# Patient Record
Sex: Female | Born: 1950 | Race: White | Hispanic: No | Marital: Married | Smoking: Never smoker
Health system: Southern US, Community
[De-identification: ages and names within clinical notes are randomized; demographics above are authoritative.]

## PROBLEM LIST (undated history)

## (undated) DIAGNOSIS — T8149XA Infection following a procedure, other surgical site, initial encounter: Secondary | ICD-10-CM

## (undated) DIAGNOSIS — Z9889 Other specified postprocedural states: Secondary | ICD-10-CM

## (undated) DIAGNOSIS — E119 Type 2 diabetes mellitus without complications: Secondary | ICD-10-CM

## (undated) DIAGNOSIS — R112 Nausea with vomiting, unspecified: Secondary | ICD-10-CM

## (undated) DIAGNOSIS — I10 Essential (primary) hypertension: Secondary | ICD-10-CM

## (undated) DIAGNOSIS — J189 Pneumonia, unspecified organism: Secondary | ICD-10-CM

## (undated) HISTORY — PX: COLONOSCOPY: SHX174

## (undated) HISTORY — PX: ABDOMINAL HYSTERECTOMY: SHX81

## (undated) HISTORY — PX: CHOLECYSTECTOMY: SHX55

---

## 1998-07-19 ENCOUNTER — Other Ambulatory Visit: Admission: RE | Admit: 1998-07-19 | Discharge: 1998-07-19 | Payer: Self-pay | Admitting: Obstetrics and Gynecology

## 1998-09-15 ENCOUNTER — Other Ambulatory Visit: Admission: RE | Admit: 1998-09-15 | Discharge: 1998-09-15 | Payer: Self-pay | Admitting: Obstetrics and Gynecology

## 1999-09-07 ENCOUNTER — Encounter: Admission: RE | Admit: 1999-09-07 | Discharge: 1999-12-06 | Payer: Self-pay | Admitting: Endocrinology

## 2001-11-04 ENCOUNTER — Other Ambulatory Visit: Admission: RE | Admit: 2001-11-04 | Discharge: 2001-11-04 | Payer: Self-pay | Admitting: *Deleted

## 2001-11-07 ENCOUNTER — Encounter: Admission: RE | Admit: 2001-11-07 | Discharge: 2001-11-07 | Payer: Self-pay | Admitting: *Deleted

## 2001-11-07 ENCOUNTER — Encounter: Payer: Self-pay | Admitting: *Deleted

## 2001-12-03 ENCOUNTER — Encounter: Payer: Self-pay | Admitting: *Deleted

## 2001-12-03 ENCOUNTER — Encounter: Admission: RE | Admit: 2001-12-03 | Discharge: 2001-12-03 | Payer: Self-pay | Admitting: *Deleted

## 2001-12-10 ENCOUNTER — Ambulatory Visit (HOSPITAL_COMMUNITY): Admission: RE | Admit: 2001-12-10 | Discharge: 2001-12-10 | Payer: Self-pay | Admitting: *Deleted

## 2002-01-27 ENCOUNTER — Encounter: Payer: Self-pay | Admitting: Emergency Medicine

## 2002-01-27 ENCOUNTER — Emergency Department (HOSPITAL_COMMUNITY): Admission: EM | Admit: 2002-01-27 | Discharge: 2002-01-27 | Payer: Self-pay | Admitting: Emergency Medicine

## 2002-01-30 ENCOUNTER — Observation Stay (HOSPITAL_COMMUNITY): Admission: RE | Admit: 2002-01-30 | Discharge: 2002-01-31 | Payer: Self-pay | Admitting: *Deleted

## 2005-04-17 ENCOUNTER — Other Ambulatory Visit: Admission: RE | Admit: 2005-04-17 | Discharge: 2005-04-17 | Payer: Self-pay | Admitting: Gynecology

## 2010-12-11 ENCOUNTER — Emergency Department (HOSPITAL_COMMUNITY): Payer: BLUE CROSS/BLUE SHIELD

## 2010-12-11 ENCOUNTER — Emergency Department (HOSPITAL_COMMUNITY)
Admission: EM | Admit: 2010-12-11 | Discharge: 2010-12-11 | Disposition: A | Discharge status: Home / Self Care | Payer: BLUE CROSS/BLUE SHIELD | Attending: Emergency Medicine | Admitting: Emergency Medicine

## 2010-12-11 DIAGNOSIS — K6289 Other specified diseases of anus and rectum: Secondary | ICD-10-CM | POA: Insufficient documentation

## 2010-12-11 DIAGNOSIS — K5900 Constipation, unspecified: Secondary | ICD-10-CM | POA: Insufficient documentation

## 2010-12-11 DIAGNOSIS — K644 Residual hemorrhoidal skin tags: Secondary | ICD-10-CM | POA: Insufficient documentation

## 2010-12-11 LAB — OCCULT BLOOD, POC DEVICE: Fecal Occult Bld: NEGATIVE

## 2011-01-25 ENCOUNTER — Encounter (INDEPENDENT_AMBULATORY_CARE_PROVIDER_SITE_OTHER): Payer: Self-pay | Admitting: General Surgery

## 2011-03-07 ENCOUNTER — Ambulatory Visit (INDEPENDENT_AMBULATORY_CARE_PROVIDER_SITE_OTHER): Payer: BLUE CROSS/BLUE SHIELD | Admitting: General Surgery

## 2011-03-07 ENCOUNTER — Encounter (INDEPENDENT_AMBULATORY_CARE_PROVIDER_SITE_OTHER): Payer: Self-pay | Admitting: General Surgery

## 2011-03-07 VITALS — Wt 246.0 lb

## 2011-03-07 DIAGNOSIS — K648 Other hemorrhoids: Secondary | ICD-10-CM

## 2011-03-07 MED ORDER — NAPROXEN 500 MG PO TABS: 500.0000 mg | ORAL_TABLET | Freq: Two times a day (BID) | ORAL | Status: DC

## 2011-03-07 MED ORDER — POLYETHYLENE GLYCOL 3350 17 G PO PACK: 17.0000 g | PACK | Freq: Every day | ORAL | Status: DC

## 2011-03-07 NOTE — Progress Notes (Signed)
Subjective:     Patient ID: Jacqueline Goodman, female   DOB: 02-Aug-1951, 60 y.o.   MRN: [id number]    Wt 246 lb (111.585 kg)    HPI Patient returns for followup of internal hemorrhoids. She is still having pain. His leads her to holding her bowel movements. She was seen in the emergency department. She was prescribed polyethylene glycol. She has also been taking naproxen. Both have helped. He has not had significant blood per rectum. She occasionally notices prolapse of her internal hemorrhoids.  Review of Systems     Objective:   Physical Exam Patient is awake and alert. Abdomen is soft nontender. Lungs are clear to auscultation. Heart is regular. Perianal exam reveals multiple small skin tags consistent with old external hemorrhoids. There is no acute inflammation or tenderness. There is no evidence of infection. Digital rectal exam reveals internal hemorrhoids in the posterior lateral left side. Anoscopy was then done. Inflamed internal hemorrhoid was tender to touch with banding forcep. The area above was then injected with sclerosing solution. There was minimal bleeding. She tolerated this well.    Assessment:     Inflamed prolapsing internal hemorrhoid.    Plan:     Area was injected as above. I will refill her naproxen. I will also refill her polyethylene glycol. I see her back in 6 weeks.

## 2011-04-18 ENCOUNTER — Encounter (INDEPENDENT_AMBULATORY_CARE_PROVIDER_SITE_OTHER): Payer: Self-pay | Admitting: General Surgery

## 2011-04-18 ENCOUNTER — Ambulatory Visit (INDEPENDENT_AMBULATORY_CARE_PROVIDER_SITE_OTHER): Payer: BLUE CROSS/BLUE SHIELD | Admitting: General Surgery

## 2011-04-18 VITALS — BP 142/80 | HR 56 | Wt 252.0 lb

## 2011-04-18 DIAGNOSIS — K648 Other hemorrhoids: Secondary | ICD-10-CM

## 2011-04-18 MED ORDER — POLYETHYLENE GLYCOL 3350 17 G PO PACK: 17.0000 g | PACK | Freq: Every day | ORAL | Status: DC

## 2011-04-18 MED ORDER — NAPROXEN 500 MG PO TABS: 500.0000 mg | ORAL_TABLET | Freq: Two times a day (BID) | ORAL | Status: DC

## 2011-04-18 NOTE — Progress Notes (Signed)
Subjective:     Patient ID: Jacqueline Goodman, female   DOB: 1951-07-02, 60 y.o.   MRN: [id number]  HPI Patient is status post injection of inflamed internal hemorrhoids. She continues to have some intermittent anal pain. She's only had one episode of bleeding since her last visit. She does request refill of pain medication and her polyethylene glycol. She has been avoiding constipation as long as she takes a polyethylene glycol. She's having no abdominal pain.  Review of Systems     Objective:   Physical Exam  Constitutional: She appears well-nourished. No distress.  HENT:  Head: Normocephalic and atraumatic.  Cardiovascular: Normal rate and regular rhythm.   Pulmonary/Chest: Effort normal. No respiratory distress.  Abdominal: Soft. She exhibits no distension. There is no tenderness.   External anal exam reveals several skin tags. There are noninflamed external hemorrhoids. Digital rectal exam reveals improvement of internal hemorrhoids and posterior area. Anoscopy was then done revealing a large stool burden. This was cleaned out and the scope was placed. The inflamed internal hemorrhoid in the posterior region is smaller but remains present. At this time it was nontender to grasping forceps. A band was applied in standard fashion and she tolerated this well with no bleeding.    Assessment:     Inflamed internal hemorrhoid    Plan:     This was banded as described above I will refill her polyethylene glycol and pain medicine. We'll see her back in 6 weeks. And as discussed in detail the patient and family member. Questions were answered.

## 2011-05-21 ENCOUNTER — Encounter (INDEPENDENT_AMBULATORY_CARE_PROVIDER_SITE_OTHER): Payer: Self-pay

## 2011-05-23 ENCOUNTER — Encounter (INDEPENDENT_AMBULATORY_CARE_PROVIDER_SITE_OTHER): Payer: Self-pay | Admitting: General Surgery

## 2011-05-23 ENCOUNTER — Ambulatory Visit (INDEPENDENT_AMBULATORY_CARE_PROVIDER_SITE_OTHER): Payer: BC Managed Care – PPO | Admitting: General Surgery

## 2011-05-23 VITALS — BP 148/90 | HR 90 | Temp 96.5°F | Wt 248.0 lb

## 2011-05-23 DIAGNOSIS — K648 Other hemorrhoids: Secondary | ICD-10-CM

## 2011-05-23 MED ORDER — NAPROXEN 500 MG PO TABS: 500.0000 mg | ORAL_TABLET | Freq: Two times a day (BID) | ORAL | Status: DC

## 2011-05-23 MED ORDER — POLYETHYLENE GLYCOL 3350 17 G PO PACK: 17.0000 g | PACK | Freq: Every day | ORAL | Status: DC

## 2011-05-23 NOTE — Progress Notes (Signed)
Subjective:     Patient ID: Jacqueline Goodman, female   DOB: 02/10/1951, 60 y.o.   MRN: [id number]  HPI Patient presents for followup of internal hemorrhoids. On her last visit she had banding procedure done. She has had no significant bleeding. She has in turn and pain. She does request refill of her naproxen which helped significantly with this discomfort. She had been doing pretty well stuck down into a period of constipation over this past weekend when [place]. She take a MiraLax and was able to go to the bathroom last night. She is otherwise doing pretty well.  Review of Systems     Objective:   Physical Exam  Eyes: Conjunctivae are normal. Pupils are equal, round, and reactive to light.  Cardiovascular: Normal rate, regular rhythm and normal heart sounds.   Pulmonary/Chest: Effort normal and breath sounds normal. She has no wheezes.  Abdominal: Soft. She exhibits no distension. There is no tenderness.  External anal exam reveals circumferential skin tags. There is no evidence of infection. There are no external hemorrhoids. Digital rectal exam reveals resolution of significant internal hemorrhoids. Anoscopy was then done showing minimal residual  internal hemorrhoid tissue with resolving inflammation. There is no bleeding. No other masses are seen.     Assessment:     Internal hemorrhoids responding well to office treatment    Plan:     Refill naproxen and MiraLax. No office treatments needed today. We will see how she is doing in 2 months.

## 2011-07-11 ENCOUNTER — Encounter (INDEPENDENT_AMBULATORY_CARE_PROVIDER_SITE_OTHER): Payer: BC Managed Care – PPO | Admitting: General Surgery

## 2011-08-22 ENCOUNTER — Encounter (INDEPENDENT_AMBULATORY_CARE_PROVIDER_SITE_OTHER): Payer: BC Managed Care – PPO | Admitting: General Surgery

## 2011-09-29 ENCOUNTER — Other Ambulatory Visit (INDEPENDENT_AMBULATORY_CARE_PROVIDER_SITE_OTHER): Payer: Self-pay | Admitting: General Surgery

## 2012-11-25 ENCOUNTER — Telehealth (INDEPENDENT_AMBULATORY_CARE_PROVIDER_SITE_OTHER): Payer: Self-pay

## 2012-11-25 NOTE — Telephone Encounter (Signed)
I agree with the advice you gave them.  Thank you

## 2012-11-25 NOTE — Telephone Encounter (Signed)
The daughter and the pt called.  She is upset on the phone because she hasn't had a bm since Saturday.  It was diarrhea then.  Before that was Tuesday.  She has tried Miralax, an enema and saline?Marland Kitchen  She feels sick and can't eat.  She has been in before for hemorrhoids.  I told her she is very constipated and may need to go to the ER to get disimpacted.   She said the last time she went there they didn't do anything for her but give her a RX and a pain pill.  I advised she can call her medical md.  We don't do disimpactions here.  I said she can see Dr Janee Morn tomorrow to check her hemorrhoids but she needs to be cleaned out.  He will not be able to examine her otherwise.  I said she should remain on liquids only.  She can take Miralax twice a day.  She asked about doing a bowel prep.  I said if she wants to try the bottle of Miralax mixed with 64 oz of Gatorade she could.  She is going to try and call me by the end of the day to let me know if she needs to come tomorrow if she gets cleaned out.  I told her I will ask Dr Janee Morn if he has any other advice.

## 2012-11-25 NOTE — Telephone Encounter (Signed)
I called to check on the patient and she said she had a good bowel movement after taking Miralax and Gatorade.  She feels much better.  She wants to cancel her appointment for tomorrow with Dr Janee Morn because she has missed too much work.  I cancelled the appointment.

## 2012-11-26 ENCOUNTER — Ambulatory Visit (INDEPENDENT_AMBULATORY_CARE_PROVIDER_SITE_OTHER): Payer: BC Managed Care – PPO | Admitting: General Surgery

## 2013-06-23 ENCOUNTER — Ambulatory Visit: Payer: Self-pay | Admitting: Gynecology

## 2014-04-27 ENCOUNTER — Encounter: Payer: Self-pay | Admitting: Gynecology

## 2014-12-01 ENCOUNTER — Encounter (INDEPENDENT_AMBULATORY_CARE_PROVIDER_SITE_OTHER): Payer: BLUE CROSS/BLUE SHIELD | Admitting: Ophthalmology

## 2014-12-01 DIAGNOSIS — I10 Essential (primary) hypertension: Secondary | ICD-10-CM

## 2014-12-01 DIAGNOSIS — E11331 Type 2 diabetes mellitus with moderate nonproliferative diabetic retinopathy with macular edema: Secondary | ICD-10-CM

## 2014-12-01 DIAGNOSIS — H35033 Hypertensive retinopathy, bilateral: Secondary | ICD-10-CM

## 2014-12-01 DIAGNOSIS — H43813 Vitreous degeneration, bilateral: Secondary | ICD-10-CM

## 2014-12-01 DIAGNOSIS — E11311 Type 2 diabetes mellitus with unspecified diabetic retinopathy with macular edema: Secondary | ICD-10-CM

## 2014-12-27 ENCOUNTER — Encounter (INDEPENDENT_AMBULATORY_CARE_PROVIDER_SITE_OTHER): Payer: BLUE CROSS/BLUE SHIELD | Admitting: Ophthalmology

## 2014-12-29 ENCOUNTER — Encounter (INDEPENDENT_AMBULATORY_CARE_PROVIDER_SITE_OTHER): Payer: BLUE CROSS/BLUE SHIELD | Admitting: Ophthalmology

## 2014-12-29 DIAGNOSIS — E11311 Type 2 diabetes mellitus with unspecified diabetic retinopathy with macular edema: Secondary | ICD-10-CM | POA: Diagnosis not present

## 2014-12-29 DIAGNOSIS — H43813 Vitreous degeneration, bilateral: Secondary | ICD-10-CM

## 2014-12-29 DIAGNOSIS — H35033 Hypertensive retinopathy, bilateral: Secondary | ICD-10-CM

## 2014-12-29 DIAGNOSIS — E11341 Type 2 diabetes mellitus with severe nonproliferative diabetic retinopathy with macular edema: Secondary | ICD-10-CM | POA: Diagnosis not present

## 2014-12-29 DIAGNOSIS — I10 Essential (primary) hypertension: Secondary | ICD-10-CM | POA: Diagnosis not present

## 2014-12-29 DIAGNOSIS — D3132 Benign neoplasm of left choroid: Secondary | ICD-10-CM | POA: Diagnosis not present

## 2015-01-26 ENCOUNTER — Encounter (INDEPENDENT_AMBULATORY_CARE_PROVIDER_SITE_OTHER): Payer: BLUE CROSS/BLUE SHIELD | Admitting: Ophthalmology

## 2015-01-26 DIAGNOSIS — D3132 Benign neoplasm of left choroid: Secondary | ICD-10-CM

## 2015-01-26 DIAGNOSIS — E11331 Type 2 diabetes mellitus with moderate nonproliferative diabetic retinopathy with macular edema: Secondary | ICD-10-CM

## 2015-01-26 DIAGNOSIS — H43813 Vitreous degeneration, bilateral: Secondary | ICD-10-CM

## 2015-01-26 DIAGNOSIS — E11311 Type 2 diabetes mellitus with unspecified diabetic retinopathy with macular edema: Secondary | ICD-10-CM

## 2015-01-26 DIAGNOSIS — I10 Essential (primary) hypertension: Secondary | ICD-10-CM | POA: Diagnosis not present

## 2015-01-26 DIAGNOSIS — H35033 Hypertensive retinopathy, bilateral: Secondary | ICD-10-CM | POA: Diagnosis not present

## 2015-02-09 ENCOUNTER — Other Ambulatory Visit (INDEPENDENT_AMBULATORY_CARE_PROVIDER_SITE_OTHER): Payer: BLUE CROSS/BLUE SHIELD | Admitting: Ophthalmology

## 2015-02-09 DIAGNOSIS — E11331 Type 2 diabetes mellitus with moderate nonproliferative diabetic retinopathy with macular edema: Secondary | ICD-10-CM

## 2015-02-09 DIAGNOSIS — E11311 Type 2 diabetes mellitus with unspecified diabetic retinopathy with macular edema: Secondary | ICD-10-CM | POA: Diagnosis not present

## 2015-02-23 ENCOUNTER — Encounter (INDEPENDENT_AMBULATORY_CARE_PROVIDER_SITE_OTHER): Payer: BLUE CROSS/BLUE SHIELD | Admitting: Ophthalmology

## 2015-02-23 DIAGNOSIS — D3132 Benign neoplasm of left choroid: Secondary | ICD-10-CM

## 2015-02-23 DIAGNOSIS — H43813 Vitreous degeneration, bilateral: Secondary | ICD-10-CM

## 2015-02-23 DIAGNOSIS — H35033 Hypertensive retinopathy, bilateral: Secondary | ICD-10-CM | POA: Diagnosis not present

## 2015-02-23 DIAGNOSIS — E11331 Type 2 diabetes mellitus with moderate nonproliferative diabetic retinopathy with macular edema: Secondary | ICD-10-CM | POA: Diagnosis not present

## 2015-02-23 DIAGNOSIS — I10 Essential (primary) hypertension: Secondary | ICD-10-CM | POA: Diagnosis not present

## 2015-02-23 DIAGNOSIS — E11311 Type 2 diabetes mellitus with unspecified diabetic retinopathy with macular edema: Secondary | ICD-10-CM | POA: Diagnosis not present

## 2015-03-11 ENCOUNTER — Other Ambulatory Visit: Payer: Self-pay | Admitting: Family Medicine

## 2015-03-11 DIAGNOSIS — E2839 Other primary ovarian failure: Secondary | ICD-10-CM

## 2015-03-11 DIAGNOSIS — Z1231 Encounter for screening mammogram for malignant neoplasm of breast: Secondary | ICD-10-CM

## 2015-03-14 ENCOUNTER — Ambulatory Visit
Admission: RE | Admit: 2015-03-14 | Discharge: 2015-03-14 | Disposition: A | Discharge status: Home / Self Care | Payer: BLUE CROSS/BLUE SHIELD | Source: Ambulatory Visit | Attending: Family Medicine | Admitting: Family Medicine

## 2015-03-14 DIAGNOSIS — E2839 Other primary ovarian failure: Secondary | ICD-10-CM

## 2015-03-23 ENCOUNTER — Encounter (INDEPENDENT_AMBULATORY_CARE_PROVIDER_SITE_OTHER): Payer: BLUE CROSS/BLUE SHIELD | Admitting: Ophthalmology

## 2015-03-23 DIAGNOSIS — E11331 Type 2 diabetes mellitus with moderate nonproliferative diabetic retinopathy with macular edema: Secondary | ICD-10-CM

## 2015-03-23 DIAGNOSIS — H43813 Vitreous degeneration, bilateral: Secondary | ICD-10-CM

## 2015-03-23 DIAGNOSIS — I10 Essential (primary) hypertension: Secondary | ICD-10-CM | POA: Diagnosis not present

## 2015-03-23 DIAGNOSIS — E11311 Type 2 diabetes mellitus with unspecified diabetic retinopathy with macular edema: Secondary | ICD-10-CM

## 2015-03-23 DIAGNOSIS — D3132 Benign neoplasm of left choroid: Secondary | ICD-10-CM

## 2015-03-23 DIAGNOSIS — H3531 Nonexudative age-related macular degeneration: Secondary | ICD-10-CM

## 2015-03-23 DIAGNOSIS — H35033 Hypertensive retinopathy, bilateral: Secondary | ICD-10-CM | POA: Diagnosis not present

## 2015-03-29 ENCOUNTER — Other Ambulatory Visit: Payer: Self-pay

## 2015-04-06 ENCOUNTER — Encounter (INDEPENDENT_AMBULATORY_CARE_PROVIDER_SITE_OTHER): Payer: BLUE CROSS/BLUE SHIELD | Admitting: Ophthalmology

## 2015-04-06 DIAGNOSIS — E11331 Type 2 diabetes mellitus with moderate nonproliferative diabetic retinopathy with macular edema: Secondary | ICD-10-CM | POA: Diagnosis not present

## 2015-04-06 DIAGNOSIS — E11311 Type 2 diabetes mellitus with unspecified diabetic retinopathy with macular edema: Secondary | ICD-10-CM | POA: Diagnosis not present

## 2015-04-13 ENCOUNTER — Encounter (INDEPENDENT_AMBULATORY_CARE_PROVIDER_SITE_OTHER): Payer: BLUE CROSS/BLUE SHIELD | Admitting: Ophthalmology

## 2015-04-18 ENCOUNTER — Encounter (INDEPENDENT_AMBULATORY_CARE_PROVIDER_SITE_OTHER): Payer: BLUE CROSS/BLUE SHIELD | Admitting: Ophthalmology

## 2015-04-18 DIAGNOSIS — E11331 Type 2 diabetes mellitus with moderate nonproliferative diabetic retinopathy with macular edema: Secondary | ICD-10-CM

## 2015-04-18 DIAGNOSIS — H43813 Vitreous degeneration, bilateral: Secondary | ICD-10-CM | POA: Diagnosis not present

## 2015-04-18 DIAGNOSIS — E11311 Type 2 diabetes mellitus with unspecified diabetic retinopathy with macular edema: Secondary | ICD-10-CM | POA: Diagnosis not present

## 2015-04-18 DIAGNOSIS — D3132 Benign neoplasm of left choroid: Secondary | ICD-10-CM | POA: Diagnosis not present

## 2015-04-18 DIAGNOSIS — H35033 Hypertensive retinopathy, bilateral: Secondary | ICD-10-CM

## 2015-04-18 DIAGNOSIS — I10 Essential (primary) hypertension: Secondary | ICD-10-CM

## 2015-04-28 ENCOUNTER — Ambulatory Visit: Payer: Self-pay

## 2015-05-18 ENCOUNTER — Encounter (INDEPENDENT_AMBULATORY_CARE_PROVIDER_SITE_OTHER): Payer: BLUE CROSS/BLUE SHIELD | Admitting: Ophthalmology

## 2015-05-18 DIAGNOSIS — D3132 Benign neoplasm of left choroid: Secondary | ICD-10-CM | POA: Diagnosis not present

## 2015-05-18 DIAGNOSIS — H35033 Hypertensive retinopathy, bilateral: Secondary | ICD-10-CM

## 2015-05-18 DIAGNOSIS — E11331 Type 2 diabetes mellitus with moderate nonproliferative diabetic retinopathy with macular edema: Secondary | ICD-10-CM | POA: Diagnosis not present

## 2015-05-18 DIAGNOSIS — H43813 Vitreous degeneration, bilateral: Secondary | ICD-10-CM | POA: Diagnosis not present

## 2015-05-18 DIAGNOSIS — I10 Essential (primary) hypertension: Secondary | ICD-10-CM | POA: Diagnosis not present

## 2015-05-18 DIAGNOSIS — E11311 Type 2 diabetes mellitus with unspecified diabetic retinopathy with macular edema: Secondary | ICD-10-CM

## 2015-06-15 ENCOUNTER — Encounter (INDEPENDENT_AMBULATORY_CARE_PROVIDER_SITE_OTHER): Payer: BLUE CROSS/BLUE SHIELD | Admitting: Ophthalmology

## 2015-06-15 DIAGNOSIS — H353121 Nonexudative age-related macular degeneration, left eye, early dry stage: Secondary | ICD-10-CM

## 2015-06-15 DIAGNOSIS — I10 Essential (primary) hypertension: Secondary | ICD-10-CM

## 2015-06-15 DIAGNOSIS — E11311 Type 2 diabetes mellitus with unspecified diabetic retinopathy with macular edema: Secondary | ICD-10-CM | POA: Diagnosis not present

## 2015-06-15 DIAGNOSIS — H43813 Vitreous degeneration, bilateral: Secondary | ICD-10-CM | POA: Diagnosis not present

## 2015-06-15 DIAGNOSIS — D3132 Benign neoplasm of left choroid: Secondary | ICD-10-CM

## 2015-06-15 DIAGNOSIS — E113313 Type 2 diabetes mellitus with moderate nonproliferative diabetic retinopathy with macular edema, bilateral: Secondary | ICD-10-CM

## 2015-06-15 DIAGNOSIS — H35033 Hypertensive retinopathy, bilateral: Secondary | ICD-10-CM

## 2015-07-01 ENCOUNTER — Encounter: Payer: Self-pay | Admitting: Gynecology

## 2015-07-13 ENCOUNTER — Encounter (INDEPENDENT_AMBULATORY_CARE_PROVIDER_SITE_OTHER): Payer: BLUE CROSS/BLUE SHIELD | Admitting: Ophthalmology

## 2015-07-13 DIAGNOSIS — H43813 Vitreous degeneration, bilateral: Secondary | ICD-10-CM

## 2015-07-13 DIAGNOSIS — H35033 Hypertensive retinopathy, bilateral: Secondary | ICD-10-CM

## 2015-07-13 DIAGNOSIS — E11311 Type 2 diabetes mellitus with unspecified diabetic retinopathy with macular edema: Secondary | ICD-10-CM | POA: Diagnosis not present

## 2015-07-13 DIAGNOSIS — E113313 Type 2 diabetes mellitus with moderate nonproliferative diabetic retinopathy with macular edema, bilateral: Secondary | ICD-10-CM | POA: Diagnosis not present

## 2015-07-13 DIAGNOSIS — H353121 Nonexudative age-related macular degeneration, left eye, early dry stage: Secondary | ICD-10-CM

## 2015-07-13 DIAGNOSIS — I10 Essential (primary) hypertension: Secondary | ICD-10-CM | POA: Diagnosis not present

## 2015-07-13 DIAGNOSIS — D3132 Benign neoplasm of left choroid: Secondary | ICD-10-CM | POA: Diagnosis not present

## 2015-08-10 ENCOUNTER — Encounter (INDEPENDENT_AMBULATORY_CARE_PROVIDER_SITE_OTHER): Payer: BLUE CROSS/BLUE SHIELD | Admitting: Ophthalmology

## 2015-08-10 DIAGNOSIS — H43813 Vitreous degeneration, bilateral: Secondary | ICD-10-CM | POA: Diagnosis not present

## 2015-08-10 DIAGNOSIS — E113312 Type 2 diabetes mellitus with moderate nonproliferative diabetic retinopathy with macular edema, left eye: Secondary | ICD-10-CM

## 2015-08-10 DIAGNOSIS — H5340 Unspecified visual field defects: Secondary | ICD-10-CM | POA: Diagnosis not present

## 2015-08-10 DIAGNOSIS — E11311 Type 2 diabetes mellitus with unspecified diabetic retinopathy with macular edema: Secondary | ICD-10-CM | POA: Diagnosis not present

## 2015-08-10 DIAGNOSIS — E113391 Type 2 diabetes mellitus with moderate nonproliferative diabetic retinopathy without macular edema, right eye: Secondary | ICD-10-CM | POA: Diagnosis not present

## 2015-08-10 DIAGNOSIS — H35033 Hypertensive retinopathy, bilateral: Secondary | ICD-10-CM | POA: Diagnosis not present

## 2015-08-10 DIAGNOSIS — D3132 Benign neoplasm of left choroid: Secondary | ICD-10-CM

## 2015-08-10 DIAGNOSIS — I10 Essential (primary) hypertension: Secondary | ICD-10-CM | POA: Diagnosis not present

## 2015-09-05 ENCOUNTER — Encounter: Payer: Self-pay | Admitting: Gynecology

## 2015-09-07 ENCOUNTER — Encounter (INDEPENDENT_AMBULATORY_CARE_PROVIDER_SITE_OTHER): Payer: BLUE CROSS/BLUE SHIELD | Admitting: Ophthalmology

## 2015-09-07 DIAGNOSIS — H35033 Hypertensive retinopathy, bilateral: Secondary | ICD-10-CM | POA: Diagnosis not present

## 2015-09-07 DIAGNOSIS — I10 Essential (primary) hypertension: Secondary | ICD-10-CM

## 2015-09-07 DIAGNOSIS — E11311 Type 2 diabetes mellitus with unspecified diabetic retinopathy with macular edema: Secondary | ICD-10-CM

## 2015-09-07 DIAGNOSIS — H43813 Vitreous degeneration, bilateral: Secondary | ICD-10-CM

## 2015-09-07 DIAGNOSIS — E113312 Type 2 diabetes mellitus with moderate nonproliferative diabetic retinopathy with macular edema, left eye: Secondary | ICD-10-CM

## 2015-09-07 DIAGNOSIS — E113391 Type 2 diabetes mellitus with moderate nonproliferative diabetic retinopathy without macular edema, right eye: Secondary | ICD-10-CM

## 2015-09-07 DIAGNOSIS — D3132 Benign neoplasm of left choroid: Secondary | ICD-10-CM

## 2015-10-05 ENCOUNTER — Encounter (INDEPENDENT_AMBULATORY_CARE_PROVIDER_SITE_OTHER): Payer: BLUE CROSS/BLUE SHIELD | Admitting: Ophthalmology

## 2015-10-05 DIAGNOSIS — H35033 Hypertensive retinopathy, bilateral: Secondary | ICD-10-CM

## 2015-10-05 DIAGNOSIS — H43813 Vitreous degeneration, bilateral: Secondary | ICD-10-CM | POA: Diagnosis not present

## 2015-10-05 DIAGNOSIS — I10 Essential (primary) hypertension: Secondary | ICD-10-CM | POA: Diagnosis not present

## 2015-10-05 DIAGNOSIS — D3132 Benign neoplasm of left choroid: Secondary | ICD-10-CM

## 2015-10-05 DIAGNOSIS — E113313 Type 2 diabetes mellitus with moderate nonproliferative diabetic retinopathy with macular edema, bilateral: Secondary | ICD-10-CM | POA: Diagnosis not present

## 2015-10-05 DIAGNOSIS — E11311 Type 2 diabetes mellitus with unspecified diabetic retinopathy with macular edema: Secondary | ICD-10-CM

## 2015-10-17 LAB — HM DIABETES EYE EXAM

## 2015-11-02 ENCOUNTER — Encounter (INDEPENDENT_AMBULATORY_CARE_PROVIDER_SITE_OTHER): Payer: BLUE CROSS/BLUE SHIELD | Admitting: Ophthalmology

## 2015-11-02 DIAGNOSIS — I10 Essential (primary) hypertension: Secondary | ICD-10-CM

## 2015-11-02 DIAGNOSIS — H35033 Hypertensive retinopathy, bilateral: Secondary | ICD-10-CM | POA: Diagnosis not present

## 2015-11-02 DIAGNOSIS — E113313 Type 2 diabetes mellitus with moderate nonproliferative diabetic retinopathy with macular edema, bilateral: Secondary | ICD-10-CM | POA: Diagnosis not present

## 2015-11-02 DIAGNOSIS — D3132 Benign neoplasm of left choroid: Secondary | ICD-10-CM | POA: Diagnosis not present

## 2015-11-02 DIAGNOSIS — E11311 Type 2 diabetes mellitus with unspecified diabetic retinopathy with macular edema: Secondary | ICD-10-CM | POA: Diagnosis not present

## 2015-11-02 DIAGNOSIS — H43813 Vitreous degeneration, bilateral: Secondary | ICD-10-CM | POA: Diagnosis not present

## 2015-11-25 ENCOUNTER — Encounter (INDEPENDENT_AMBULATORY_CARE_PROVIDER_SITE_OTHER): Payer: BLUE CROSS/BLUE SHIELD | Admitting: Ophthalmology

## 2015-11-30 ENCOUNTER — Encounter (INDEPENDENT_AMBULATORY_CARE_PROVIDER_SITE_OTHER): Payer: BLUE CROSS/BLUE SHIELD | Admitting: Ophthalmology

## 2015-11-30 DIAGNOSIS — E11311 Type 2 diabetes mellitus with unspecified diabetic retinopathy with macular edema: Secondary | ICD-10-CM

## 2015-11-30 DIAGNOSIS — H43813 Vitreous degeneration, bilateral: Secondary | ICD-10-CM | POA: Diagnosis not present

## 2015-11-30 DIAGNOSIS — H35033 Hypertensive retinopathy, bilateral: Secondary | ICD-10-CM | POA: Diagnosis not present

## 2015-11-30 DIAGNOSIS — I10 Essential (primary) hypertension: Secondary | ICD-10-CM | POA: Diagnosis not present

## 2015-11-30 DIAGNOSIS — D3132 Benign neoplasm of left choroid: Secondary | ICD-10-CM

## 2015-11-30 DIAGNOSIS — E113313 Type 2 diabetes mellitus with moderate nonproliferative diabetic retinopathy with macular edema, bilateral: Secondary | ICD-10-CM

## 2016-01-04 ENCOUNTER — Encounter (INDEPENDENT_AMBULATORY_CARE_PROVIDER_SITE_OTHER): Payer: BLUE CROSS/BLUE SHIELD | Admitting: Ophthalmology

## 2016-01-04 DIAGNOSIS — E11311 Type 2 diabetes mellitus with unspecified diabetic retinopathy with macular edema: Secondary | ICD-10-CM

## 2016-01-04 DIAGNOSIS — H35033 Hypertensive retinopathy, bilateral: Secondary | ICD-10-CM | POA: Diagnosis not present

## 2016-01-04 DIAGNOSIS — H43813 Vitreous degeneration, bilateral: Secondary | ICD-10-CM

## 2016-01-04 DIAGNOSIS — D3132 Benign neoplasm of left choroid: Secondary | ICD-10-CM | POA: Diagnosis not present

## 2016-01-04 DIAGNOSIS — I10 Essential (primary) hypertension: Secondary | ICD-10-CM

## 2016-01-04 DIAGNOSIS — E113313 Type 2 diabetes mellitus with moderate nonproliferative diabetic retinopathy with macular edema, bilateral: Secondary | ICD-10-CM | POA: Diagnosis not present

## 2016-03-01 ENCOUNTER — Encounter (INDEPENDENT_AMBULATORY_CARE_PROVIDER_SITE_OTHER): Payer: BLUE CROSS/BLUE SHIELD | Admitting: Ophthalmology

## 2016-03-01 DIAGNOSIS — I10 Essential (primary) hypertension: Secondary | ICD-10-CM

## 2016-03-01 DIAGNOSIS — H43813 Vitreous degeneration, bilateral: Secondary | ICD-10-CM | POA: Diagnosis not present

## 2016-03-01 DIAGNOSIS — E11311 Type 2 diabetes mellitus with unspecified diabetic retinopathy with macular edema: Secondary | ICD-10-CM | POA: Diagnosis not present

## 2016-03-01 DIAGNOSIS — E113313 Type 2 diabetes mellitus with moderate nonproliferative diabetic retinopathy with macular edema, bilateral: Secondary | ICD-10-CM

## 2016-03-01 DIAGNOSIS — D3132 Benign neoplasm of left choroid: Secondary | ICD-10-CM | POA: Diagnosis not present

## 2016-03-01 DIAGNOSIS — H35033 Hypertensive retinopathy, bilateral: Secondary | ICD-10-CM

## 2016-04-25 ENCOUNTER — Encounter (INDEPENDENT_AMBULATORY_CARE_PROVIDER_SITE_OTHER): Payer: BLUE CROSS/BLUE SHIELD | Admitting: Ophthalmology

## 2016-04-25 DIAGNOSIS — I10 Essential (primary) hypertension: Secondary | ICD-10-CM

## 2016-04-25 DIAGNOSIS — E113312 Type 2 diabetes mellitus with moderate nonproliferative diabetic retinopathy with macular edema, left eye: Secondary | ICD-10-CM | POA: Diagnosis not present

## 2016-04-25 DIAGNOSIS — H43813 Vitreous degeneration, bilateral: Secondary | ICD-10-CM | POA: Diagnosis not present

## 2016-04-25 DIAGNOSIS — E11311 Type 2 diabetes mellitus with unspecified diabetic retinopathy with macular edema: Secondary | ICD-10-CM | POA: Diagnosis not present

## 2016-04-25 DIAGNOSIS — E113391 Type 2 diabetes mellitus with moderate nonproliferative diabetic retinopathy without macular edema, right eye: Secondary | ICD-10-CM | POA: Diagnosis not present

## 2016-04-25 DIAGNOSIS — H35033 Hypertensive retinopathy, bilateral: Secondary | ICD-10-CM

## 2016-04-25 DIAGNOSIS — D3132 Benign neoplasm of left choroid: Secondary | ICD-10-CM

## 2016-04-26 ENCOUNTER — Encounter (INDEPENDENT_AMBULATORY_CARE_PROVIDER_SITE_OTHER): Payer: BLUE CROSS/BLUE SHIELD | Admitting: Ophthalmology

## 2016-06-06 ENCOUNTER — Encounter: Payer: Self-pay | Admitting: Family Medicine

## 2016-06-06 ENCOUNTER — Ambulatory Visit (INDEPENDENT_AMBULATORY_CARE_PROVIDER_SITE_OTHER): Payer: Medicare Other | Admitting: Family Medicine

## 2016-06-06 VITALS — BP 151/84 | HR 84 | Temp 98.0°F | Ht 68.2 in | Wt 248.4 lb

## 2016-06-06 DIAGNOSIS — R609 Edema, unspecified: Secondary | ICD-10-CM

## 2016-06-06 DIAGNOSIS — E669 Obesity, unspecified: Secondary | ICD-10-CM

## 2016-06-06 DIAGNOSIS — Z114 Encounter for screening for human immunodeficiency virus [HIV]: Secondary | ICD-10-CM

## 2016-06-06 DIAGNOSIS — Z1159 Encounter for screening for other viral diseases: Secondary | ICD-10-CM | POA: Diagnosis not present

## 2016-06-06 DIAGNOSIS — E118 Type 2 diabetes mellitus with unspecified complications: Secondary | ICD-10-CM

## 2016-06-06 DIAGNOSIS — E1169 Type 2 diabetes mellitus with other specified complication: Secondary | ICD-10-CM | POA: Diagnosis not present

## 2016-06-06 LAB — CBC
HCT: 39 % (ref 35.0–45.0)
Hemoglobin: 13.6 g/dL (ref 11.7–15.5)
MCH: 29.5 pg (ref 27.0–33.0)
MCHC: 34.9 g/dL (ref 32.0–36.0)
MCV: 84.6 fL (ref 80.0–100.0)
MPV: 8.7 fL (ref 7.5–12.5)
Platelets: 236 10*3/uL (ref 140–400)
RBC: 4.61 MIL/uL (ref 3.80–5.10)
RDW: 13.3 % (ref 11.0–15.0)
WBC: 8.3 10*3/uL (ref 3.8–10.8)

## 2016-06-06 LAB — COMPLETE METABOLIC PANEL WITH GFR
ALT: 15 U/L (ref 6–29)
AST: 22 U/L (ref 10–35)
Albumin: 3.7 g/dL (ref 3.6–5.1)
Alkaline Phosphatase: 80 U/L (ref 33–130)
BUN: 15 mg/dL (ref 7–25)
CO2: 26 mmol/L (ref 20–31)
Calcium: 8.9 mg/dL (ref 8.6–10.4)
Chloride: 97 mmol/L — ABNORMAL LOW (ref 98–110)
Creat: 0.62 mg/dL (ref 0.50–0.99)
GFR, Est African American: >89 mL/min (ref >=60)
GFR, Est Non African American: >89 mL/min (ref >=60)
Glucose, Bld: 141 mg/dL — ABNORMAL HIGH (ref 65–99)
Potassium: 4 mmol/L (ref 3.5–5.3)
Sodium: 130 mmol/L — ABNORMAL LOW (ref 135–146)
Total Bilirubin: 0.5 mg/dL (ref 0.2–1.2)
Total Protein: 6.6 g/dL (ref 6.1–8.1)

## 2016-06-06 LAB — HEPATITIS C ANTIBODY: HCV Ab: NEGATIVE

## 2016-06-06 LAB — POCT GLYCOSYLATED HEMOGLOBIN (HGB A1C): Hemoglobin A1C: 7.4

## 2016-06-06 LAB — TSH: TSH: 1.36 mIU/L

## 2016-06-06 NOTE — Progress Notes (Signed)
Subjective  First visit.  S[employer]  Edema Has had more swelling than usual in both feet L>R for 1-2 months.  No change in diet or medications.  Edema resolves overnight but worsens during the day.  No shortness of breath or chest pain or orthopnea or PND.  Does feel like her abdomen has been growing.   No bleeding No skin changes or heat cold intolerance.  DIABETES Disease Monitoring: Blood Sugar ranges(Severity) -ranges below 200 mostly  Associated Symptoms- Polyuria/phagia/dipsia- no      Visual problems- no Medications: Compliance(Modifying factor) - daily glimiperide Hypoglycemic symptoms- no Timing - continuous  ROS Patient reports no  vision/ hearing changes,anorexia, weight change, fever ,adenopathy, persistant / recurrent hoarseness, swallowing issues, chest pain, persistant / recurrent cough, hemoptysis, dyspnea(rest, exertional, paroxysmal nocturnal), gastrointestinal  bleeding (melena, rectal bleeding), abdominal pain, excessive heart burn, GU symptoms(dysuria, hematuria, pyuria, voiding/incontinence  Issues) syncope, focal weakness, severe memory loss, concerning skin lesions, depression, anxiety, abnormal bruising/bleeding, major joint swelling, breast masses or abnormal vaginal bleeding.     Chief Complaint noted Review of Symptoms - see HPI PMH - Smoking status noted.     Objective Vital Signs reviewed Alert nad able to get up and down from exam table without assistance Heart - Regular rate and rhythm.  No murmurs, gallops or rubs.    Lungs:  Normal respiratory effort, chest expands symmetrically. Lungs are clear to auscultation, no crackles or wheezes. Abdomen: Obese soft and non-tender without masses, organomegaly or hernias noted.  No guarding or rebound Ears:  External ear exam shows no significant lesions or deformities.  Otoscopic examination reveals clear canals, tympanic membranes are intact bilaterally without bulging, retraction, inflammation or  discharge. Hearing is grossly normal bilaterall Neck:  No deformities, thyromegaly, masses, or tenderness noted.   Supple with full range of motion without pain. Skin:  Intact without suspicious lesions or rashes  Does have ant bites on feet that are resolving according to patient Extrem - 1-2 + pitting edema at ankles bilat.  No skin breakdown.  Normal pulses Eye - Pupils Equal Round Reactive to light, Extraocular movements intact, Fundi without hemorrhage or visible lesions, Conjunctiva without redness or discharge Psych:  Cognition and judgment appear intact. Alert, communicative  and cooperative with normal attention span and concentration. No apparent delusions, illusions, hallucinations  Diabetic Foot Check -  Appearance - no lesions, ulcers or calluses Skin - no unusual pallor or redness Monofilament testing -  Right - Great toe, medial, central, lateral ball and posterior foot intact Left - Great toe, medial, central, lateral ball and posterior foot intact      Assessments/Plans  No problem-specific Assessment & Plan notes found for this encounter.   See Encounter view if individual problem A/Ps not visible See after visit summary for details of patient instuctions

## 2016-06-06 NOTE — Patient Instructions (Signed)
Good to see you today!  Thanks for coming in.  Please ask your Eye doctor and your GI to send me records also Dr Lisbeth Ply  Please bring in your meter next visit  I will call you if your lab tests are not normal.  Otherwise we will discuss them at your next visit.   Come back in 3-4 weeks

## 2016-06-07 ENCOUNTER — Encounter: Payer: Self-pay | Admitting: Family Medicine

## 2016-06-07 DIAGNOSIS — E1169 Type 2 diabetes mellitus with other specified complication: Secondary | ICD-10-CM | POA: Insufficient documentation

## 2016-06-07 DIAGNOSIS — E669 Obesity, unspecified: Secondary | ICD-10-CM | POA: Insufficient documentation

## 2016-06-07 LAB — HIV ANTIBODY (ROUTINE TESTING W REFLEX): HIV 1&2 Ab, 4th Generation: NONREACTIVE

## 2016-06-07 NOTE — Assessment & Plan Note (Signed)
New onset.  Uncertain prognosis.  Check labs to rule out systemic causes.  Try elevation.   If does not improve and no cause noted on labs may need Pelvic US to rule out obstructive lesion

## 2016-06-07 NOTE — Assessment & Plan Note (Signed)
Stable.  Continue current medications.  Discuss metformin at next visit which may help with weight loss

## 2016-06-13 ENCOUNTER — Other Ambulatory Visit: Payer: Self-pay | Admitting: Family Medicine

## 2016-06-13 DIAGNOSIS — Z1231 Encounter for screening mammogram for malignant neoplasm of breast: Secondary | ICD-10-CM

## 2016-06-20 ENCOUNTER — Encounter (INDEPENDENT_AMBULATORY_CARE_PROVIDER_SITE_OTHER): Payer: BLUE CROSS/BLUE SHIELD | Admitting: Ophthalmology

## 2016-06-27 ENCOUNTER — Ambulatory Visit (INDEPENDENT_AMBULATORY_CARE_PROVIDER_SITE_OTHER): Payer: Medicare Other | Admitting: Family Medicine

## 2016-06-27 ENCOUNTER — Encounter: Payer: Self-pay | Admitting: Family Medicine

## 2016-06-27 VITALS — BP 146/68 | HR 87 | Temp 98.0°F | Wt 245.0 lb

## 2016-06-27 DIAGNOSIS — E669 Obesity, unspecified: Secondary | ICD-10-CM

## 2016-06-27 DIAGNOSIS — E871 Hypo-osmolality and hyponatremia: Secondary | ICD-10-CM

## 2016-06-27 DIAGNOSIS — E1169 Type 2 diabetes mellitus with other specified complication: Secondary | ICD-10-CM | POA: Diagnosis not present

## 2016-06-27 DIAGNOSIS — I10 Essential (primary) hypertension: Secondary | ICD-10-CM | POA: Diagnosis not present

## 2016-06-27 DIAGNOSIS — R609 Edema, unspecified: Secondary | ICD-10-CM | POA: Diagnosis not present

## 2016-06-27 LAB — BASIC METABOLIC PANEL
BUN: 25 mg/dL (ref 7–25)
CO2: 27 mmol/L (ref 20–31)
Calcium: 9.2 mg/dL (ref 8.6–10.4)
Chloride: 102 mmol/L (ref 98–110)
Creat: 0.88 mg/dL (ref 0.50–0.99)
Glucose, Bld: 205 mg/dL — ABNORMAL HIGH (ref 65–99)
Potassium: 4.2 mmol/L (ref 3.5–5.3)
Sodium: 139 mmol/L (ref 135–146)

## 2016-06-27 NOTE — Patient Instructions (Addendum)
Good to see you today!  Thanks for coming in.  Check to see that the blood pressure medicine is Losartan 25 mg every night  If this is not what the bottle says then call me  Keep taking the Glimiperide  Bring all your medications and meter next visit  Find out about your Colonoscopy - send me records   Keep wearing the stockings  I will call you if your tests are not good.  Otherwise I will send you a letter.  If you do not hear from me with in 2 weeks please call our office.

## 2016-06-27 NOTE — Progress Notes (Signed)
Subjective  Patient is presenting with the following illnesses  Edema Seems much improved wearing support hose.  Does get alittle worse at night.  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  She does not have any lightheadness or urinary frequency or confusion or nausea and vomiting or any history of troulbe with her sodium.  HYPERTENSION Disease Monitoring  Home BP Monitoring (Severity) not checking Symptoms - Chest pain- no    Dyspnea- no Medications (Modifying factors) Compliance-  Used to take losartan and has at home but has not been taking. Lightheadedness-  no  Edema- see above Timing - continuous  Duration - years ROS - See HPI  PMH Lab Review   Potassium  Date Value Ref Range Status  06/27/2016 4.2 3.5 - 5.3 mmol/L Final   Sodium  Date Value Ref Range Status  06/27/2016 139 135 - 146 mmol/L Final   Creat  Date Value Ref Range Status  06/27/2016 0.88 0.50 - 0.99 mg/dL Final    Comment:      For patients > or = 65 years of age: The upper reference limit for Creatinine is approximately 13% higher for people identified as African-American.            Chief Complaint noted Review of Symptoms - see HPI PMH - Smoking status noted.     Objective Vital Signs reviewed Alert nad Feet - trace edema wearing stockings  Heart - Regular rate and rhythm.  No murmurs, gallops or rubs.    Lungs:  Normal respiratory effort, chest expands symmetrically. Lungs are clear to auscultation, no crackles or wheezes.    Assessments/Plans   Hyponatremia Will recheck BMET.  No problem-specific Assessment & Plan notes found for this encounter.   See Encounter view if individual problem A/Ps not visible See after visit summary for details of patient instuctions

## 2016-06-28 ENCOUNTER — Encounter (INDEPENDENT_AMBULATORY_CARE_PROVIDER_SITE_OTHER): Payer: Medicare Other | Admitting: Ophthalmology

## 2016-06-28 DIAGNOSIS — E11311 Type 2 diabetes mellitus with unspecified diabetic retinopathy with macular edema: Secondary | ICD-10-CM

## 2016-06-28 DIAGNOSIS — I10 Essential (primary) hypertension: Secondary | ICD-10-CM | POA: Insufficient documentation

## 2016-06-28 DIAGNOSIS — D3132 Benign neoplasm of left choroid: Secondary | ICD-10-CM | POA: Diagnosis not present

## 2016-06-28 DIAGNOSIS — H35033 Hypertensive retinopathy, bilateral: Secondary | ICD-10-CM

## 2016-06-28 DIAGNOSIS — H43813 Vitreous degeneration, bilateral: Secondary | ICD-10-CM

## 2016-06-28 DIAGNOSIS — E113313 Type 2 diabetes mellitus with moderate nonproliferative diabetic retinopathy with macular edema, bilateral: Secondary | ICD-10-CM | POA: Diagnosis not present

## 2016-06-28 NOTE — Assessment & Plan Note (Signed)
BP Readings from Last 3 Encounters:  06/27/16 (!) 146/68  06/06/16 (!) 151/84  05/23/11 (!) 148/90   Elevated today.  Will restart the losartan she has at home and monitor.  Encouraged weight loss

## 2016-06-28 NOTE — Assessment & Plan Note (Signed)
Improved

## 2016-06-29 ENCOUNTER — Encounter: Payer: Self-pay | Admitting: Family Medicine

## 2016-07-03 ENCOUNTER — Ambulatory Visit
Admission: RE | Admit: 2016-07-03 | Discharge: 2016-07-03 | Disposition: A | Discharge status: Home / Self Care | Payer: Medicare Other | Source: Ambulatory Visit | Attending: Family Medicine | Admitting: Family Medicine

## 2016-07-03 DIAGNOSIS — Z1231 Encounter for screening mammogram for malignant neoplasm of breast: Secondary | ICD-10-CM

## 2016-07-05 ENCOUNTER — Encounter: Payer: Self-pay | Admitting: Family Medicine

## 2016-07-05 DIAGNOSIS — E785 Hyperlipidemia, unspecified: Secondary | ICD-10-CM | POA: Insufficient documentation

## 2016-07-06 ENCOUNTER — Telehealth: Payer: Self-pay | Admitting: Family Medicine

## 2016-07-06 NOTE — Telephone Encounter (Signed)
Pt just wanted to let Dr. Erin Hearing know pt's last colonoscopy was June 30,2015. Please advise. Thanks! ep

## 2016-07-23 ENCOUNTER — Telehealth: Payer: Self-pay | Admitting: Family Medicine

## 2016-07-23 NOTE — Telephone Encounter (Signed)
-----   Message from Lind Covert, MD sent at 07/23/2016  2:04 PM EST ----- Regarding: DM eye exam Sees Dr Delman Cheadle  Thanks

## 2016-07-23 NOTE — Telephone Encounter (Signed)
Needs presciption for new glucometer, prefers to stay with one touch brand.  Please let pt know when the prescription has been called in

## 2016-07-23 NOTE — Telephone Encounter (Signed)
Will forward to PCP.  Martin, Tamika L, RN  

## 2016-07-23 NOTE — Telephone Encounter (Signed)
Let her know the good news is that she does not need to check her blood sugar since she is not on insulin and her A1c < 8.  Thanks  [place]

## 2016-07-23 NOTE — Telephone Encounter (Signed)
Pt informed and voiced understanding she is no longer expected to check her sugar.

## 2016-07-23 NOTE — Telephone Encounter (Signed)
Eye exam report has been requested from Dr Delman Cheadle. Their office will fax it to [place].

## 2016-07-26 NOTE — Telephone Encounter (Signed)
Pt called wanting to get a new glucometer. Please advise. Thanks! ep

## 2016-07-27 NOTE — Telephone Encounter (Signed)
Spoke with patient and informed her again that she didn't need to continue to check her blood sugar.  She insisted that a nutritionist and previous provider said she should check it daily.  I informed her that due to better controlled diabetes on medication she wouldn't need to.  Patient stated that she "would just buy one out of pocket and continue to check it for her own safety."  Jazmin Hartsell,CMA

## 2016-08-16 ENCOUNTER — Other Ambulatory Visit: Payer: Self-pay | Admitting: Family Medicine

## 2016-08-16 DIAGNOSIS — N63 Unspecified lump in unspecified breast: Secondary | ICD-10-CM

## 2016-08-24 ENCOUNTER — Ambulatory Visit
Admission: RE | Admit: 2016-08-24 | Discharge: 2016-08-24 | Disposition: A | Discharge status: Home / Self Care | Payer: Medicare Other | Source: Ambulatory Visit | Attending: Family Medicine | Admitting: Family Medicine

## 2016-08-24 DIAGNOSIS — N63 Unspecified lump in unspecified breast: Secondary | ICD-10-CM

## 2016-08-24 DIAGNOSIS — N6011 Diffuse cystic mastopathy of right breast: Secondary | ICD-10-CM | POA: Diagnosis not present

## 2016-08-24 DIAGNOSIS — R928 Other abnormal and inconclusive findings on diagnostic imaging of breast: Secondary | ICD-10-CM | POA: Diagnosis not present

## 2016-08-29 ENCOUNTER — Ambulatory Visit (INDEPENDENT_AMBULATORY_CARE_PROVIDER_SITE_OTHER): Payer: Medicare Other | Admitting: Family Medicine

## 2016-08-29 ENCOUNTER — Other Ambulatory Visit: Payer: Self-pay | Admitting: Family Medicine

## 2016-08-29 ENCOUNTER — Encounter: Payer: Self-pay | Admitting: Family Medicine

## 2016-08-29 VITALS — BP 140/78 | HR 86 | Temp 98.0°F | Ht 68.0 in | Wt 247.0 lb

## 2016-08-29 DIAGNOSIS — E1169 Type 2 diabetes mellitus with other specified complication: Secondary | ICD-10-CM

## 2016-08-29 DIAGNOSIS — I10 Essential (primary) hypertension: Secondary | ICD-10-CM | POA: Diagnosis not present

## 2016-08-29 DIAGNOSIS — E669 Obesity, unspecified: Secondary | ICD-10-CM

## 2016-08-29 LAB — POCT GLYCOSYLATED HEMOGLOBIN (HGB A1C): Hemoglobin A1C: 9

## 2016-08-29 NOTE — Assessment & Plan Note (Signed)
Not taking her losartan regularly.  Encouraged her to do this

## 2016-08-29 NOTE — Assessment & Plan Note (Signed)
Worsened.  She is against starting new medications. Insists she can lower it with diet.  She was controlled last a1c.

## 2016-08-29 NOTE — Patient Instructions (Addendum)
Good to see you today!  Thanks for coming in.  Your A1c is 9.0  Come back in 3 months and if still high will need to add another medication  Take the blood pressure medicine every evening every day   Call in with the exact name of the strips I will send in a meter and the name of your doctor who did the colonoscopy

## 2016-08-29 NOTE — Telephone Encounter (Signed)
Pt needs a new glucose meter. Pt would like a One Touch Ultra 2 meter and test strips. [facility] uses [facility] in [place]. ep

## 2016-08-29 NOTE — Progress Notes (Signed)
Subjective  Patient is presenting with the following illnesses  HYPERTENSION Disease Monitoring: Blood pressure range-not checking Chest pain, palpitations- no      Dyspnea- no Medications: Compliance- has not taken losartan for a week Lightheadedness,Syncope- no   Edema- none new  DIABETES Disease Monitoring: Blood Sugar ranges-not checking Polyuria/phagia/dipsia- no      Visual problems- no Medications: Compliance- taking daily but has been eating a lot of sweets Hypoglycemic symptoms- no   Monitoring Labs and Parameters Last A1C:  Lab Results  Component Value Date   HGBA1C 9.0 08/29/2016    Last Lipid: No results found for: CHOL, HDL, LDLDIRECT  Last Bmet  Potassium  Date Value Ref Range Status  06/27/2016 4.2 3.5 - 5.3 mmol/L Final   Sodium  Date Value Ref Range Status  06/27/2016 139 135 - 146 mmol/L Final   Creat  Date Value Ref Range Status  06/27/2016 0.88 0.50 - 0.99 mg/dL Final    Comment:      For patients > or = 66 years of age: The upper reference limit for Creatinine is approximately 13% higher for people identified as African-American.         Last BPs:  BP Readings from Last 3 Encounters:  08/29/16 140/78  06/27/16 (!) 146/68  06/06/16 (!) 151/84        Chief Complaint noted Review of Symptoms - see HPI PMH - Smoking status noted.     Objective Vital Signs reviewed Heart - Regular rate and rhythm.  No murmurs, gallops or rubs.    Lungs:  Normal respiratory effort, chest expands symmetrically. Lungs are clear to auscultation, no crackles or wheezes.     Assessments/Plans  No problem-specific Assessment & Plan notes found for this encounter.   See Encounter view if individual problem A/Ps not visible See after visit summary for details of patient instuctions

## 2016-08-30 MED ORDER — GLUCOSE BLOOD VI STRP: ORAL_STRIP | 3 refills | Status: DC

## 2016-08-30 MED ORDER — ONETOUCH ULTRA 2 W/DEVICE KIT: PACK | 0 refills | Status: DC

## 2016-08-31 ENCOUNTER — Telehealth: Payer: Self-pay | Admitting: Family Medicine

## 2016-08-31 MED ORDER — GLUCOSE BLOOD VI STRP: ORAL_STRIP | 3 refills | Status: DC

## 2016-08-31 MED ORDER — ONETOUCH ULTRA 2 W/DEVICE KIT: PACK | 0 refills | Status: DC

## 2016-08-31 NOTE — Telephone Encounter (Signed)
[facility] from [facility] requesting ICD-10 code for diabetic supplies and directions for test strips.  Information resent to patient's pharmacy.  Derl Barrow, RN

## 2016-08-31 NOTE — Telephone Encounter (Signed)
Pt called said that pharmacy needs a diagnose code for her strips. Please call so that she can pick these up. jw

## 2016-09-06 ENCOUNTER — Encounter (INDEPENDENT_AMBULATORY_CARE_PROVIDER_SITE_OTHER): Payer: Medicare Other | Admitting: Ophthalmology

## 2016-09-06 DIAGNOSIS — I10 Essential (primary) hypertension: Secondary | ICD-10-CM

## 2016-09-06 DIAGNOSIS — H35033 Hypertensive retinopathy, bilateral: Secondary | ICD-10-CM

## 2016-09-06 DIAGNOSIS — E11311 Type 2 diabetes mellitus with unspecified diabetic retinopathy with macular edema: Secondary | ICD-10-CM

## 2016-09-06 DIAGNOSIS — E113313 Type 2 diabetes mellitus with moderate nonproliferative diabetic retinopathy with macular edema, bilateral: Secondary | ICD-10-CM | POA: Diagnosis not present

## 2016-09-06 DIAGNOSIS — H43813 Vitreous degeneration, bilateral: Secondary | ICD-10-CM

## 2016-09-06 DIAGNOSIS — D3132 Benign neoplasm of left choroid: Secondary | ICD-10-CM

## 2016-09-06 LAB — HM DIABETES EYE EXAM

## 2016-09-11 MED ORDER — GLUCOSE BLOOD VI STRP: ORAL_STRIP | 3 refills | Status: DC

## 2016-09-11 MED ORDER — ONETOUCH ULTRA 2 W/DEVICE KIT: PACK | 0 refills | Status: DC

## 2016-09-11 NOTE — Telephone Encounter (Signed)
Received fax from [facility] stating that the ICD-10 codes used for billing for one touch meter and strips will not cover.  ICD-10 code E11.9 sent in with new Rx.  Derl Barrow, RN

## 2016-09-11 NOTE — Addendum Note (Signed)
Addended by: Derl Barrow on: 09/11/2016 11:49 AM   Modules accepted: Orders

## 2016-11-21 ENCOUNTER — Encounter (INDEPENDENT_AMBULATORY_CARE_PROVIDER_SITE_OTHER): Payer: Medicare Other | Admitting: Ophthalmology

## 2016-11-21 DIAGNOSIS — I10 Essential (primary) hypertension: Secondary | ICD-10-CM

## 2016-11-21 DIAGNOSIS — E113313 Type 2 diabetes mellitus with moderate nonproliferative diabetic retinopathy with macular edema, bilateral: Secondary | ICD-10-CM

## 2016-11-21 DIAGNOSIS — E11311 Type 2 diabetes mellitus with unspecified diabetic retinopathy with macular edema: Secondary | ICD-10-CM

## 2016-11-21 DIAGNOSIS — H43813 Vitreous degeneration, bilateral: Secondary | ICD-10-CM

## 2016-11-21 DIAGNOSIS — D3132 Benign neoplasm of left choroid: Secondary | ICD-10-CM | POA: Diagnosis not present

## 2016-11-22 ENCOUNTER — Encounter (INDEPENDENT_AMBULATORY_CARE_PROVIDER_SITE_OTHER): Payer: Medicare Other | Admitting: Ophthalmology

## 2016-11-28 ENCOUNTER — Other Ambulatory Visit: Payer: Self-pay | Admitting: Family Medicine

## 2016-11-28 NOTE — Telephone Encounter (Signed)
Patient calling to request refill of:  Name of Medication(s):  glimepiride  Last date of OV: 08/29/2016 (scheduled for 12/12/16) Pharmacy:  [facility]  Will route refill request to Clinic RN.  Discussed with patient policy to call pharmacy for future refills.  Also, discussed refills may take up to 48 hours to approve or deny.  [place]

## 2016-11-29 MED ORDER — LOSARTAN POTASSIUM 25 MG PO TABS: 25.0000 mg | ORAL_TABLET | Freq: Every day | ORAL | 1 refills | Status: DC

## 2016-11-29 MED ORDER — GLIMEPIRIDE 4 MG PO TABS: 4.0000 mg | ORAL_TABLET | Freq: Every day | ORAL | 0 refills | Status: DC

## 2016-11-29 NOTE — Telephone Encounter (Signed)
Pt has an appt for 4/18. She needs to get refills on losartan and glimepiride. Please call pt at  [phone] to verify Rx has been sent. Ottis Stain, CMA

## 2016-12-11 ENCOUNTER — Other Ambulatory Visit: Payer: Self-pay | Admitting: Family Medicine

## 2016-12-11 DIAGNOSIS — E7849 Other hyperlipidemia: Secondary | ICD-10-CM

## 2016-12-12 ENCOUNTER — Ambulatory Visit (INDEPENDENT_AMBULATORY_CARE_PROVIDER_SITE_OTHER): Payer: Medicare Other | Admitting: Family Medicine

## 2016-12-12 ENCOUNTER — Encounter: Payer: Self-pay | Admitting: Family Medicine

## 2016-12-12 ENCOUNTER — Other Ambulatory Visit: Payer: Medicare Other

## 2016-12-12 VITALS — BP 160/88 | HR 73 | Temp 97.4°F | Ht 68.0 in | Wt 239.4 lb

## 2016-12-12 DIAGNOSIS — E784 Other hyperlipidemia: Secondary | ICD-10-CM | POA: Diagnosis not present

## 2016-12-12 DIAGNOSIS — E1169 Type 2 diabetes mellitus with other specified complication: Secondary | ICD-10-CM

## 2016-12-12 DIAGNOSIS — I10 Essential (primary) hypertension: Secondary | ICD-10-CM | POA: Diagnosis not present

## 2016-12-12 DIAGNOSIS — E113493 Type 2 diabetes mellitus with severe nonproliferative diabetic retinopathy without macular edema, bilateral: Secondary | ICD-10-CM | POA: Diagnosis not present

## 2016-12-12 DIAGNOSIS — E7849 Other hyperlipidemia: Secondary | ICD-10-CM

## 2016-12-12 DIAGNOSIS — D3132 Benign neoplasm of left choroid: Secondary | ICD-10-CM | POA: Diagnosis not present

## 2016-12-12 DIAGNOSIS — E669 Obesity, unspecified: Secondary | ICD-10-CM

## 2016-12-12 LAB — HM DIABETES EYE EXAM

## 2016-12-12 LAB — POCT GLYCOSYLATED HEMOGLOBIN (HGB A1C): Hemoglobin A1C: 7.9

## 2016-12-12 MED ORDER — LOSARTAN POTASSIUM 25 MG PO TABS: 25.0000 mg | ORAL_TABLET | Freq: Every day | ORAL | 0 refills | Status: DC

## 2016-12-12 MED ORDER — GLIMEPIRIDE 4 MG PO TABS: 4.0000 mg | ORAL_TABLET | Freq: Every day | ORAL | 0 refills | Status: DC

## 2016-12-12 NOTE — Assessment & Plan Note (Signed)
BP Readings from Last 3 Encounters:  12/12/16 (!) 160/88  08/29/16 140/78  06/27/16 (!) 146/68   Not at goal today but just came from eye doctor after dilation.  Will try to follow at home.  Contnue weight loss

## 2016-12-12 NOTE — Assessment & Plan Note (Signed)
Check labs today.

## 2016-12-12 NOTE — Patient Instructions (Signed)
Good to see you today!  Thanks for coming in.  Great idea to get a pill box.  Take both medicines same time every AM  Your blood pressure is too high but may be due to the eye exam.   Measure your blood pressure when you can.  If it is regularly > 140/90 then call me   Keep working on the weight you are making progress Aim is to lose 1-2 lbs a week  I will call you if your tests are not good.  Otherwise I will send you a letter.  If you do not hear from me with in 2 weeks please call our office.     Contact your gastroenterologist ask them to send me a record.  Or call us and tell us the name and address   Come back in 3 months for an A1c check

## 2016-12-12 NOTE — Assessment & Plan Note (Signed)
Improving.  She wants to continue to work on weight loss

## 2016-12-12 NOTE — Progress Notes (Signed)
Subjective  Patient is presenting with the following illnesses  DIABETES Disease Monitoring: Blood Sugar ranges(Severity) -not checking  Associated Symptoms- Polyuria/phagia/dipsia- no      Visual problems- none new Medications: Compliance(Modifying factor) - takes "most" days Hypoglycemic symptoms- no Timing - continuous  HYPERTENSION Disease Monitoring  Home BP Monitoring (Severity) not checking Symptoms - Chest pain- no    Dyspnea- no  Medications (Modifying factors) Compliance-  Most days . Lightheadedness-  no  Edema- no Timing - continuous  Duration - years ROS - See HPI  PMH Lab Review   Potassium  Date Value Ref Range Status  06/27/2016 4.2 3.5 - 5.3 mmol/L Final   Sodium  Date Value Ref Range Status  06/27/2016 139 135 - 146 mmol/L Final   Creat  Date Value Ref Range Status  06/27/2016 0.88 0.50 - 0.99 mg/dL Final    Comment:      For patients > or = 66 years of age: The upper reference limit for Creatinine is approximately 13% higher for people identified as African-American.        CHOLESTEROL Was on pravastatin in past.  Has been losing weight on purpose.  No CAD.  No medications currently     Chief Complaint noted Review of Symptoms - see HPI PMH - Smoking status noted.     Objective Vital Signs reviewed  Heart - Regular rate and rhythm.  No murmurs, gallops or rubs.    Lungs:  Normal respiratory effort, chest expands symmetrically. Lungs are clear to auscultation, no crackles or wheezes. Marland Kitchenextem    Assessments/Plans  No problem-specific Assessment & Plan notes found for this encounter.   See Encounter view if individual problem A/Ps not visible See after visit summary for details of patient instuctions

## 2016-12-13 LAB — LIPID PANEL
Chol/HDL Ratio: 3.6 ratio (ref 0.0–4.4)
Cholesterol, Total: 206 mg/dL — ABNORMAL HIGH (ref 100–199)
HDL: 57 mg/dL (ref >39)
LDL Calculated: 126 mg/dL — ABNORMAL HIGH (ref 0–99)
Triglycerides: 116 mg/dL (ref 0–149)
VLDL Cholesterol Cal: 23 mg/dL (ref 5–40)

## 2016-12-18 ENCOUNTER — Encounter: Payer: Self-pay | Admitting: Family Medicine

## 2016-12-19 DIAGNOSIS — H35362 Drusen (degenerative) of macula, left eye: Secondary | ICD-10-CM | POA: Diagnosis not present

## 2016-12-19 DIAGNOSIS — D487 Neoplasm of uncertain behavior of other specified sites: Secondary | ICD-10-CM | POA: Diagnosis not present

## 2016-12-26 ENCOUNTER — Other Ambulatory Visit: Payer: Self-pay | Admitting: Family Medicine

## 2017-01-08 ENCOUNTER — Telehealth: Payer: Self-pay | Admitting: Family Medicine

## 2017-01-08 NOTE — Telephone Encounter (Signed)
Pt had colonoscopy done 01/2014, repeat in 5 years. Done in [place], [place] - doesn't remember the office name. Spoke to her about her husbands colonoscopy, left phone note in his chart. - Mesha Guinyard

## 2017-02-04 ENCOUNTER — Encounter (INDEPENDENT_AMBULATORY_CARE_PROVIDER_SITE_OTHER): Payer: Medicare Other | Admitting: Ophthalmology

## 2017-02-06 ENCOUNTER — Encounter (INDEPENDENT_AMBULATORY_CARE_PROVIDER_SITE_OTHER): Payer: Medicare Other | Admitting: Ophthalmology

## 2017-02-07 ENCOUNTER — Encounter (INDEPENDENT_AMBULATORY_CARE_PROVIDER_SITE_OTHER): Payer: Medicare Other | Admitting: Ophthalmology

## 2017-02-07 DIAGNOSIS — E113313 Type 2 diabetes mellitus with moderate nonproliferative diabetic retinopathy with macular edema, bilateral: Secondary | ICD-10-CM

## 2017-02-07 DIAGNOSIS — H43813 Vitreous degeneration, bilateral: Secondary | ICD-10-CM

## 2017-02-07 DIAGNOSIS — E1111 Type 2 diabetes mellitus with ketoacidosis with coma: Secondary | ICD-10-CM

## 2017-02-07 DIAGNOSIS — D3132 Benign neoplasm of left choroid: Secondary | ICD-10-CM | POA: Diagnosis not present

## 2017-02-07 DIAGNOSIS — H35033 Hypertensive retinopathy, bilateral: Secondary | ICD-10-CM | POA: Diagnosis not present

## 2017-02-07 DIAGNOSIS — I10 Essential (primary) hypertension: Secondary | ICD-10-CM | POA: Diagnosis not present

## 2017-02-14 DIAGNOSIS — Z961 Presence of intraocular lens: Secondary | ICD-10-CM | POA: Diagnosis not present

## 2017-02-14 DIAGNOSIS — H26493 Other secondary cataract, bilateral: Secondary | ICD-10-CM | POA: Diagnosis not present

## 2017-02-14 DIAGNOSIS — D3132 Benign neoplasm of left choroid: Secondary | ICD-10-CM | POA: Diagnosis not present

## 2017-03-13 ENCOUNTER — Encounter: Payer: Self-pay | Admitting: Family Medicine

## 2017-03-13 ENCOUNTER — Encounter: Payer: Self-pay | Admitting: Licensed Clinical Social Worker

## 2017-03-13 ENCOUNTER — Other Ambulatory Visit: Payer: Self-pay | Admitting: Family Medicine

## 2017-03-13 ENCOUNTER — Ambulatory Visit (INDEPENDENT_AMBULATORY_CARE_PROVIDER_SITE_OTHER): Payer: Medicare Other | Admitting: Family Medicine

## 2017-03-13 VITALS — BP 180/80 | HR 83 | Temp 98.1°F | Wt 251.0 lb

## 2017-03-13 DIAGNOSIS — E1169 Type 2 diabetes mellitus with other specified complication: Secondary | ICD-10-CM

## 2017-03-13 DIAGNOSIS — E784 Other hyperlipidemia: Secondary | ICD-10-CM

## 2017-03-13 DIAGNOSIS — E669 Obesity, unspecified: Secondary | ICD-10-CM

## 2017-03-13 DIAGNOSIS — E7849 Other hyperlipidemia: Secondary | ICD-10-CM

## 2017-03-13 DIAGNOSIS — F418 Other specified anxiety disorders: Secondary | ICD-10-CM | POA: Diagnosis not present

## 2017-03-13 DIAGNOSIS — I10 Essential (primary) hypertension: Secondary | ICD-10-CM

## 2017-03-13 LAB — POCT GLYCOSYLATED HEMOGLOBIN (HGB A1C): Hemoglobin A1C: 10.2

## 2017-03-13 MED ORDER — GLUCOSE BLOOD VI STRP
ORAL_STRIP | 3 refills | Status: DC
Start: 2017-03-13 — End: 2017-09-09

## 2017-03-13 NOTE — Progress Notes (Signed)
Subjective  Patient is presenting with the following illnesses  HYPERTENSION Disease Monitoring: Blood pressure range-not checking Chest pain, palpitations- no      Dyspnea- no Medications: Compliance- takes daily Lightheadedness,Syncope- no   Edema- mild  DIABETES Disease Monitoring: Blood Sugar ranges-usually high  Polyuria/phagia/dipsia- no      Visual problems- no Medications: Compliance- takes daily.  Has been up to 2 days ago been eating very badly Hypoglycemic symptoms- no  HYPERLIPIDEMIA Disease Monitoring: See symptoms for Hypertension Medications: Compliance- not currently on medications Wants to try diet Right upper quadrant pain- no  Muscle aches- no  Stress Under a great deal due to recent illness of dog and her dtrs chronic memory issues and inability to work.  No suicidal ideation does not feel down just agitated   Monitoring Labs and Parameters Last A1C:  Lab Results  Component Value Date   HGBA1C 10.2 03/13/2017    Last Lipid:     Component Value Date/Time   CHOL 206 (H) 12/12/2016 1115   HDL 57 12/12/2016 1115    Last Bmet  Potassium  Date Value Ref Range Status  06/27/2016 4.2 3.5 - 5.3 mmol/L Final   Sodium  Date Value Ref Range Status  06/27/2016 139 135 - 146 mmol/L Final   Creat  Date Value Ref Range Status  06/27/2016 0.88 0.50 - 0.99 mg/dL Final    Comment:      For patients > or = 66 years of age: The upper reference limit for Creatinine is approximately 13% higher for people identified as African-American.         Last BPs:  BP Readings from Last 3 Encounters:  03/13/17 (!) 180/80  12/12/16 (!) 160/88  08/29/16 140/78    Chief Complaint noted Review of Symptoms - see HPI PMH - Smoking status noted.     Objective Vital Signs reviewed Psych:  Cognition and judgment appear intact. Alert, communicative  and cooperative with normal attention span and concentration. No apparent delusions, illusions, hallucinations Heart -  Regular rate and rhythm.  No murmurs, gallops or rubs.    Lungs:  Normal respiratory effort, chest expands symmetrically. Lungs are clear to auscultation, no crackles or wheezes.     Assessments/Plans  No problem-specific Assessment & Plan notes found for this encounter.   See Encounter view if individual problem A/Ps not visible See after visit summary for details of patient instuctions

## 2017-03-13 NOTE — Assessment & Plan Note (Signed)
Lab Results  Component Value Date   LDLCALC 126 (H) 12/12/2016   Not at goal Does not want to take medications but controll with diet.  Will see if can lose weight.  See after visit summary

## 2017-03-13 NOTE — Telephone Encounter (Signed)
Pt  calling to request refill of:  Name of Medication(s):  Test strips  Last date of OV: 03-13-17 Pharmacy:  [facility]   Will route refill request to Clinic RN.  Discussed with patient policy to call pharmacy for future refills.  Also, discussed refills may take up to 48 hours to approve or deny.  Jacqueline Goodman

## 2017-03-13 NOTE — Patient Instructions (Addendum)
Good to see you today!  Thanks for coming in.  Take two of your losartan once a day  You will work to lose weight 2 lbs a week - diet control and walking  If your blood pressure and weight is not better in one month we will start new diabetes medication  I will have our behavioral expert come to talk to your about stress  Bring your pill bottles

## 2017-03-13 NOTE — Assessment & Plan Note (Signed)
Poorly controlled. She really wants to try diet.  See after visit summary for agreement.

## 2017-03-13 NOTE — Assessment & Plan Note (Signed)
Seen by IC.

## 2017-03-13 NOTE — Progress Notes (Signed)
Total time:30 minutes Type of Service: Integrated Behavioral Health Warm Handoff Interpretor:No.   SUBJECTIVE: Jacqueline Goodman is a 66 y.o. female  referred by Dr. Erin Hearing for:  Stress at home. Patient is accompanied by her adult daughter Jacqueline Goodman.  Patient reports the following concerns:  managing her stress at . 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  INTERVENTIONS:Problem-solving teaching/coping strategies, Psychoeducation and Supportive Counseling,Reflective listening and relaxed breathing. ASSESSMENT: Patient currently experiencing stress.  Symptoms exacerbated by family stressors.  Patient may benefit from, further assessment and brief therapeutic interventions to assist with managing her sympoms.   PLAN: 1. Patient will F/U with LCSW and made an appointment if needed 2. Behavioral recommendations: relaxed breathing, problems solving tech. 4. Referral:none at this time  Warm Hand Off Completed.     Casimer Lanius, LCSW Licensed Clinical Social Worker [place]   [phone] 10:55 AM

## 2017-03-13 NOTE — Assessment & Plan Note (Signed)
Not well controlled.  Increase cozaar and monitor

## 2017-03-22 ENCOUNTER — Other Ambulatory Visit: Payer: Self-pay | Admitting: Family Medicine

## 2017-04-04 ENCOUNTER — Encounter: Payer: Self-pay | Admitting: Family Medicine

## 2017-04-04 DIAGNOSIS — D369 Benign neoplasm, unspecified site: Secondary | ICD-10-CM | POA: Insufficient documentation

## 2017-04-10 ENCOUNTER — Other Ambulatory Visit: Payer: Self-pay | Admitting: General Surgery

## 2017-04-10 ENCOUNTER — Encounter: Payer: Self-pay | Admitting: Family Medicine

## 2017-04-10 ENCOUNTER — Ambulatory Visit (INDEPENDENT_AMBULATORY_CARE_PROVIDER_SITE_OTHER): Payer: Medicare Other | Admitting: Family Medicine

## 2017-04-10 DIAGNOSIS — E669 Obesity, unspecified: Secondary | ICD-10-CM | POA: Diagnosis not present

## 2017-04-10 DIAGNOSIS — I10 Essential (primary) hypertension: Secondary | ICD-10-CM

## 2017-04-10 DIAGNOSIS — E1169 Type 2 diabetes mellitus with other specified complication: Secondary | ICD-10-CM

## 2017-04-10 DIAGNOSIS — F418 Other specified anxiety disorders: Secondary | ICD-10-CM | POA: Diagnosis not present

## 2017-04-10 DIAGNOSIS — K439 Ventral hernia without obstruction or gangrene: Secondary | ICD-10-CM | POA: Diagnosis not present
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
## 2017-04-10 NOTE — Assessment & Plan Note (Signed)
Perhaps improving.   Has not met her weight gain goal but did lose.  Wants to continue to work on diet

## 2017-04-10 NOTE — Patient Instructions (Addendum)
Good to see you today!  Thanks for coming in.  Check your drug store for any prior dose of Metformin - name and mg and if was extended release  We agreed that if your A1c is > 8 we will start another diabetes medication  Keep taking 2 losartan tabs  Aim to lose 10 lbs before next visit  Bring in all your medication and meter next visit  Come back early October

## 2017-04-10 NOTE — Assessment & Plan Note (Signed)
Some what improved.  Her daughter is being evaluated by neurology  For memory loss

## 2017-04-10 NOTE — Assessment & Plan Note (Signed)
Not at goal but better.  Seems to be taking losartan 25 mg 2 tabs daily

## 2017-04-19 ENCOUNTER — Ambulatory Visit
Admission: RE | Admit: 2017-04-19 | Discharge: 2017-04-19 | Disposition: A | Discharge status: Home / Self Care | Payer: Medicare Other | Source: Ambulatory Visit | Attending: General Surgery | Admitting: General Surgery

## 2017-04-19 DIAGNOSIS — K439 Ventral hernia without obstruction or gangrene: Secondary | ICD-10-CM

## 2017-04-19 DIAGNOSIS — R1900 Intra-abdominal and pelvic swelling, mass and lump, unspecified site: Secondary | ICD-10-CM | POA: Diagnosis not present

## 2017-04-19 MED ORDER — IOPAMIDOL (ISOVUE-300) INJECTION 61%
125.0000 mL | Freq: Once | INTRAVENOUS | Status: AC | PRN
  Administered 2017-04-19: 125 mL via INTRAVENOUS

## 2017-04-25 ENCOUNTER — Encounter (INDEPENDENT_AMBULATORY_CARE_PROVIDER_SITE_OTHER): Payer: Medicare Other | Admitting: Ophthalmology

## 2017-04-25 DIAGNOSIS — E113313 Type 2 diabetes mellitus with moderate nonproliferative diabetic retinopathy with macular edema, bilateral: Secondary | ICD-10-CM

## 2017-04-25 DIAGNOSIS — D3132 Benign neoplasm of left choroid: Secondary | ICD-10-CM

## 2017-04-25 DIAGNOSIS — H43813 Vitreous degeneration, bilateral: Secondary | ICD-10-CM | POA: Diagnosis not present

## 2017-04-25 DIAGNOSIS — E11311 Type 2 diabetes mellitus with unspecified diabetic retinopathy with macular edema: Secondary | ICD-10-CM

## 2017-04-25 DIAGNOSIS — H35033 Hypertensive retinopathy, bilateral: Secondary | ICD-10-CM

## 2017-04-25 DIAGNOSIS — I10 Essential (primary) hypertension: Secondary | ICD-10-CM | POA: Diagnosis not present

## 2017-05-08 ENCOUNTER — Ambulatory Visit
Admission: RE | Admit: 2017-05-08 | Discharge: 2017-05-08 | Disposition: A | Discharge status: Home / Self Care | Payer: Medicare Other | Source: Ambulatory Visit | Attending: Family Medicine | Admitting: Family Medicine

## 2017-05-08 ENCOUNTER — Encounter: Payer: Self-pay | Admitting: Family Medicine

## 2017-05-08 ENCOUNTER — Ambulatory Visit: Payer: Medicare Other | Admitting: Family Medicine

## 2017-05-08 ENCOUNTER — Other Ambulatory Visit: Payer: Self-pay | Admitting: Family Medicine

## 2017-05-08 ENCOUNTER — Ambulatory Visit (INDEPENDENT_AMBULATORY_CARE_PROVIDER_SITE_OTHER): Payer: Medicare Other | Admitting: Family Medicine

## 2017-05-08 DIAGNOSIS — N6310 Unspecified lump in the right breast, unspecified quadrant: Secondary | ICD-10-CM

## 2017-05-08 DIAGNOSIS — N649 Disorder of breast, unspecified: Secondary | ICD-10-CM

## 2017-05-08 DIAGNOSIS — N63 Unspecified lump in unspecified breast: Secondary | ICD-10-CM

## 2017-05-08 DIAGNOSIS — R928 Other abnormal and inconclusive findings on diagnostic imaging of breast: Secondary | ICD-10-CM | POA: Diagnosis not present

## 2017-05-08 DIAGNOSIS — N6314 Unspecified lump in the right breast, lower inner quadrant: Secondary | ICD-10-CM | POA: Diagnosis not present

## 2017-05-08 NOTE — Assessment & Plan Note (Addendum)
Acute. Lesion does not appear infected though concerning for possible malignancy. Would need a diagnostic ultrasound and mammogram to further differentiate lesion. Differential includes hyperplastic polyp, nevus, hemangioma, breast cancer. --Patient to receive diagnostic mammogram and breast ultrasound later today --Advised patient to avoid manipulating the lesion
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
## 2017-05-08 NOTE — Patient Instructions (Signed)
Thank you for coming in to see [place] today. Please see below to review our plan for today's visit.  Please go to the breast [place] for imaging of this lesion. Able to perform a diagnostic mammogram and ultrasound of the area. I will call you with the results when I receive them. In the meantime, do not touch the area. However like to return to the clinic in 1 week to reevaluate lesion.  Please call the clinic at [phone] if your symptoms worsen or you have any concerns. It was my pleasure to see you. -- Harriet Butte, [place], PGY-2

## 2017-05-09 ENCOUNTER — Inpatient Hospital Stay: Admission: RE | Admit: 2017-05-09 | Payer: Medicare Other | Source: Ambulatory Visit

## 2017-05-14 ENCOUNTER — Telehealth: Payer: Self-pay | Admitting: *Deleted

## 2017-05-14 NOTE — Telephone Encounter (Signed)
Patient called stating she was suppose hear back from Dr. Yisroel Ramming for dermatologist referral.  She would like the referral to go to Dr. Druscilla Brownie.  Please give her a call at [phone].  Derl Barrow, RN

## 2017-05-14 NOTE — Telephone Encounter (Signed)
Please call patient and let her know we will be placing the referral within the next 1-2 days. Thank you.

## 2017-05-15 NOTE — Telephone Encounter (Signed)
Left message for patient to call back  

## 2017-05-16 ENCOUNTER — Other Ambulatory Visit: Payer: Self-pay | Admitting: Family Medicine

## 2017-05-16 DIAGNOSIS — D485 Neoplasm of uncertain behavior of skin: Secondary | ICD-10-CM | POA: Diagnosis not present

## 2017-05-16 DIAGNOSIS — L986 Other infiltrative disorders of the skin and subcutaneous tissue: Secondary | ICD-10-CM | POA: Diagnosis not present

## 2017-05-29 ENCOUNTER — Ambulatory Visit: Payer: Medicare Other | Admitting: Family Medicine

## 2017-06-05 ENCOUNTER — Encounter: Payer: Self-pay | Admitting: Family Medicine

## 2017-06-05 ENCOUNTER — Ambulatory Visit (INDEPENDENT_AMBULATORY_CARE_PROVIDER_SITE_OTHER): Payer: Medicare Other | Admitting: Family Medicine

## 2017-06-05 ENCOUNTER — Ambulatory Visit (HOSPITAL_COMMUNITY)
Admission: RE | Admit: 2017-06-05 | Discharge: 2017-06-05 | Disposition: A | Discharge status: Home / Self Care | Payer: Medicare Other | Source: Ambulatory Visit | Attending: Family Medicine | Admitting: Family Medicine

## 2017-06-05 VITALS — BP 172/80 | HR 88 | Temp 97.8°F | Wt 241.4 lb

## 2017-06-05 DIAGNOSIS — Z01810 Encounter for preprocedural cardiovascular examination: Secondary | ICD-10-CM | POA: Insufficient documentation

## 2017-06-05 DIAGNOSIS — Z01818 Encounter for other preprocedural examination: Secondary | ICD-10-CM | POA: Diagnosis not present

## 2017-06-05 DIAGNOSIS — E1169 Type 2 diabetes mellitus with other specified complication: Secondary | ICD-10-CM | POA: Diagnosis not present

## 2017-06-05 DIAGNOSIS — E7849 Other hyperlipidemia: Secondary | ICD-10-CM

## 2017-06-05 DIAGNOSIS — R9431 Abnormal electrocardiogram [ECG] [EKG]: Secondary | ICD-10-CM | POA: Diagnosis not present

## 2017-06-05 DIAGNOSIS — I10 Essential (primary) hypertension: Secondary | ICD-10-CM | POA: Diagnosis not present

## 2017-06-05 DIAGNOSIS — E669 Obesity, unspecified: Secondary | ICD-10-CM

## 2017-06-05 LAB — POCT GLYCOSYLATED HEMOGLOBIN (HGB A1C): Hemoglobin A1C: 6.5

## 2017-06-05 NOTE — Progress Notes (Signed)
Subjective  Patient is presenting with the following illnesses  HYPERTENSION Disease Monitoring: Blood pressure range-not checking Chest pain, palpitations- no      Dyspnea- no Medications: Compliance- losartan 2 tabs daily Lightheadedness,Syncope- no   Edema- no  DIABETES Disease Monitoring: Blood Sugar ranges-all fasting less than 150 Polyuria/phagia/dipsia- no      Visual problems- no Medications: Compliance- daily Hypoglycemic symptoms- no  HYPERLIPIDEMIA Disease Monitoring: See symptoms for Hypertension Medications: Compliance- lost 10 lbs Right upper quadrant pain- no  Muscle aches- no  PREOP Patient is undergoing pannus reduction by Dr [facility]    Monitoring Labs and Parameters Last A1C:  Lab Results  Component Value Date   HGBA1C 6.5 06/05/2017    Last Lipid:     Component Value Date/Time   CHOL 206 (H) 12/12/2016 1115   HDL 57 12/12/2016 1115   LDLDIRECT 127 (H) 06/05/2017 1113    Last Bmet  Potassium  Date Value Ref Range Status  06/05/2017 4.4 3.5 - 5.2 mmol/L Final   Sodium  Date Value Ref Range Status  06/05/2017 137 134 - 144 mmol/L Final   Creat  Date Value Ref Range Status  06/27/2016 0.88 0.50 - 0.99 mg/dL Final    Comment:      For patients > or = 66 years of age: The upper reference limit for Creatinine is approximately 13% higher for people identified as African-American.      Creatinine, Ser  Date Value Ref Range Status  06/05/2017 0.77 0.57 - 1.00 mg/dL Final      Last BPs:  BP Readings from Last 3 Encounters:  06/05/17 (!) 172/80  05/08/17 (!) 178/82  04/10/17 (!) 150/90     Chief Complaint noted Review of Symptoms - see HPI PMH - Smoking status noted.     Objective Vital Signs reviewed Alert nad Heart - Regular rate and rhythm.  No murmurs, gallops or rubs.    Lungs:  Normal respiratory effort, chest expands symmetrically. Lungs are clear to auscultation, no crackles or  wheezes.    Assessments/Plans  Diabetes mellitus type 2 in obese [facility]) Improved!  Essential hypertension BP Readings from Last 3 Encounters:  06/05/17 (!) 172/80  05/08/17 (!) 178/82  04/10/17 (!) 150/90   Elevated consistently will increase losartan and check home blood pressure readings   Hyperlipidemia Check LDL since has been losing weight   PREOP Clearance Revised cardiac risk index (RCRI) No risk factors- 0.5% (95% CI: 0.2-1.1)   See after visit summary for details of patient instuctions

## 2017-06-05 NOTE — Patient Instructions (Addendum)
Good to see you today!  Thanks for coming in.  Great work on the diabetes  - Keep up the weight loss!  For the blood pressure I would take 3 losartan tabs every day Get a blood pressure machine and measure your blood pressure several times a day and send to me by MyChart Aim is to have your blood pressure < 135-140/80-85  I will call you if your tests are not good.  Otherwise I will send you a letter.  If you do not hear from me with in 2 weeks please call our office.      Good luck with the surgery !

## 2017-06-06 LAB — CBC
Hematocrit: 40.4 % (ref 34.0–46.6)
Hemoglobin: 13.6 g/dL (ref 11.1–15.9)
MCH: 29.4 pg (ref 26.6–33.0)
MCHC: 33.7 g/dL (ref 31.5–35.7)
MCV: 87 fL (ref 79–97)
Platelets: 240 10*3/uL (ref 150–379)
RBC: 4.63 x10E6/uL (ref 3.77–5.28)
RDW: 13.8 % (ref 12.3–15.4)
WBC: 7.3 10*3/uL (ref 3.4–10.8)

## 2017-06-06 LAB — CMP14+EGFR
ALT: 21 IU/L (ref 0–32)
AST: 29 IU/L (ref 0–40)
Albumin/Globulin Ratio: 1.4 (ref 1.2–2.2)
Albumin: 3.9 g/dL (ref 3.6–4.8)
Alkaline Phosphatase: 77 IU/L (ref 39–117)
BUN/Creatinine Ratio: 22 (ref 12–28)
BUN: 17 mg/dL (ref 8–27)
Bilirubin Total: 0.3 mg/dL (ref 0.0–1.2)
CO2: 23 mmol/L (ref 20–29)
Calcium: 9.2 mg/dL (ref 8.7–10.3)
Chloride: 100 mmol/L (ref 96–106)
Creatinine, Ser: 0.77 mg/dL (ref 0.57–1.00)
GFR calc Af Amer: 93 mL/min/{1.73_m2} (ref >59)
GFR calc non Af Amer: 81 mL/min/{1.73_m2} (ref >59)
Globulin, Total: 2.7 g/dL (ref 1.5–4.5)
Glucose: 151 mg/dL — ABNORMAL HIGH (ref 65–99)
Potassium: 4.4 mmol/L (ref 3.5–5.2)
Sodium: 137 mmol/L (ref 134–144)
Total Protein: 6.6 g/dL (ref 6.0–8.5)

## 2017-06-06 LAB — LDL CHOLESTEROL, DIRECT: LDL Direct: 127 mg/dL — ABNORMAL HIGH (ref 0–99)

## 2017-06-06 NOTE — Assessment & Plan Note (Signed)
Improved

## 2017-06-06 NOTE — Assessment & Plan Note (Signed)
BP Readings from Last 3 Encounters:  06/05/17 (!) 172/80  05/08/17 (!) 178/82  04/10/17 (!) 150/90   Elevated consistently will increase losartan and check home blood pressure readings

## 2017-06-06 NOTE — Assessment & Plan Note (Signed)
Check LDL since has been losing weight

## 2017-06-11 ENCOUNTER — Telehealth: Payer: Self-pay | Admitting: *Deleted

## 2017-06-11 NOTE — Telephone Encounter (Signed)
Tyler Pita from [facility] calling to follow up on letter and labs that they need for patients surgery.  Will forward to MD . Mahogani Holohan, Salome Spotted, [place]

## 2017-06-12 ENCOUNTER — Ambulatory Visit (INDEPENDENT_AMBULATORY_CARE_PROVIDER_SITE_OTHER): Payer: Medicare Other | Admitting: Family Medicine

## 2017-06-12 VITALS — BP 160/82

## 2017-06-12 DIAGNOSIS — Z23 Encounter for immunization: Secondary | ICD-10-CM

## 2017-06-12 DIAGNOSIS — I10 Essential (primary) hypertension: Secondary | ICD-10-CM | POA: Diagnosis not present

## 2017-06-12 NOTE — Assessment & Plan Note (Signed)
BP Readings from Last 3 Encounters:  06/12/17 (!) 160/82  06/05/17 (!) 172/80  05/08/17 (!) 178/82   Still above goal but improved.  She feels it will be lower at home and is getting a monitor today.  Will call later in week.  If blood pressure ok able to proceed with surgery
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
## 2017-06-13 ENCOUNTER — Encounter: Payer: Self-pay | Admitting: Family Medicine

## 2017-06-14 ENCOUNTER — Encounter: Payer: Self-pay | Admitting: Family Medicine

## 2017-06-14 NOTE — Telephone Encounter (Signed)
Per Dr. Erin Hearing we are waiting on pt to send [place] her BP readings because we adjusted meds @ last appt.  Attempted to call Jacqueline Goodman but no answer. [place], Salome Spotted, CMA

## 2017-06-16 ENCOUNTER — Encounter: Payer: Self-pay | Admitting: Family Medicine

## 2017-06-17 ENCOUNTER — Encounter: Payer: Self-pay | Admitting: Family Medicine

## 2017-06-18 ENCOUNTER — Encounter: Payer: Self-pay | Admitting: Family Medicine

## 2017-06-19 ENCOUNTER — Encounter: Payer: Self-pay | Admitting: Family Medicine

## 2017-06-19 DIAGNOSIS — D487 Neoplasm of uncertain behavior of other specified sites: Secondary | ICD-10-CM | POA: Diagnosis not present

## 2017-06-20 ENCOUNTER — Encounter: Payer: Self-pay | Admitting: Family Medicine

## 2017-06-21 ENCOUNTER — Encounter: Payer: Self-pay | Admitting: Family Medicine

## 2017-06-24 ENCOUNTER — Encounter: Payer: Self-pay | Admitting: Family Medicine

## 2017-06-25 ENCOUNTER — Encounter: Payer: Self-pay | Admitting: Family Medicine

## 2017-06-26 ENCOUNTER — Encounter: Payer: Self-pay | Admitting: Family Medicine

## 2017-06-27 ENCOUNTER — Other Ambulatory Visit: Payer: Self-pay | Admitting: Family Medicine

## 2017-06-27 DIAGNOSIS — I10 Essential (primary) hypertension: Secondary | ICD-10-CM

## 2017-06-28 ENCOUNTER — Encounter: Payer: Self-pay | Admitting: Family Medicine

## 2017-06-28 ENCOUNTER — Other Ambulatory Visit: Payer: Medicare Other

## 2017-06-28 DIAGNOSIS — I10 Essential (primary) hypertension: Secondary | ICD-10-CM

## 2017-06-28 NOTE — Progress Notes (Signed)
Subjective  Patient is presenting with the following illnesses     Chief Complaint noted Review of Symptoms - see HPI PMH - Smoking status noted.     Objective Vital Signs reviewed     Assessments/Plans  No problem-specific Assessment & Plan notes found for this encounter.   See after visit summary for details of patient instuctions 

## 2017-06-29 LAB — BASIC METABOLIC PANEL
BUN/Creatinine Ratio: 34 — ABNORMAL HIGH (ref 12–28)
BUN: 22 mg/dL (ref 8–27)
CO2: 26 mmol/L (ref 20–29)
Calcium: 9 mg/dL (ref 8.7–10.3)
Chloride: 100 mmol/L (ref 96–106)
Creatinine, Ser: 0.65 mg/dL (ref 0.57–1.00)
GFR calc Af Amer: 107 mL/min/{1.73_m2} (ref >59)
GFR calc non Af Amer: 93 mL/min/{1.73_m2} (ref >59)
Glucose: 211 mg/dL — ABNORMAL HIGH (ref 65–99)
Potassium: 4 mmol/L (ref 3.5–5.2)
Sodium: 138 mmol/L (ref 134–144)

## 2017-07-01 ENCOUNTER — Encounter: Payer: Self-pay | Admitting: Family Medicine

## 2017-07-02 ENCOUNTER — Telehealth: Payer: Self-pay

## 2017-07-02 NOTE — Telephone Encounter (Signed)
Printed the letter for pre surgery clearance and lab results and faxed them to [address] [facility] at [facility] [phone]. Called patient to inform her that this was done.Jacqueline Goodman

## 2017-07-02 NOTE — Telephone Encounter (Signed)
Patient called and said that Dr. Lajuana Ripple office needed a copy of her BMP so that she would not have to duplicate it.  I called [place] at the number([phone]) that the patient gave me but there was no answer so that I could confirm what they actually needed. Patient wanted you to know.Jacqueline Goodman

## 2017-07-03 NOTE — Telephone Encounter (Signed)
Please fax recent bmet to office as above Thanks  [place]

## 2017-07-05 ENCOUNTER — Telehealth: Payer: Self-pay | Admitting: Family Medicine

## 2017-07-05 NOTE — Telephone Encounter (Signed)
Spoke with her  Petechiae are getting better as is weakness She took back the blood pressure cuff  Recommend her make an appointment in the next few days and certainly before surgery   Might need a cbc  If the blood spots increase or the weakness does she should go to the ER  Asked her to get another blood pressure cuff and to bring in her medications at the visit

## 2017-07-09 ENCOUNTER — Encounter: Payer: Self-pay | Admitting: Internal Medicine

## 2017-07-09 ENCOUNTER — Ambulatory Visit (INDEPENDENT_AMBULATORY_CARE_PROVIDER_SITE_OTHER): Payer: Medicare Other | Admitting: Internal Medicine

## 2017-07-09 VITALS — BP 162/90 | HR 93 | Temp 98.3°F | Wt 239.0 lb

## 2017-07-09 DIAGNOSIS — R233 Spontaneous ecchymoses: Secondary | ICD-10-CM | POA: Diagnosis not present

## 2017-07-09 NOTE — Progress Notes (Signed)
   Subjective:   Patient: Jacqueline Goodman       Birthdate: [birth year]       MRN: [id number]      HPI  Jacqueline Goodman is a 66 y.o. female presenting for petechiae.   Petechiae Patient bought a new blood pressure cuff at the drugstore after her last appt with Dr. Erin Hearing at [facility] a couple weeks ago. Her daughter subsequently noticed petechiae along the patient's R arm (the arm on which she measures her BP), extending to her hand. Patient call Dr. Erin Hearing, who recommended she be evaluated in office.  Patient returned the aforementioned blood pressure cuff about a week ago, and has been using a new cuff since then. She says the new cuff does not cause pain like the old one did. She has not noticed any new petechiae since using the new cuff. She denies any bleeding, including epistaxis, bleeding of gums, hematochezia, or hematuria. Denies weakness, dizziness, lightheadedness. Generally feels very well.  Of note, patient is scheduled for pannus reduction on 11/26.   Smoking status reviewed. Patient is never smoker.   Review of Systems See HPI.     Objective:  Physical Exam  Constitutional: She is oriented to person, place, and time and well-developed, well-nourished, and in no distress.  HENT:  Head: Normocephalic and atraumatic.  Pulmonary/Chest: Effort normal. No respiratory distress.  Musculoskeletal:  No TTP of RUE. 5/5 strength upper extremities bilaterally.   Neurological: She is alert and oriented to person, place, and time.  Skin:  Few scattered very small faint petechiae located on R upper arm where patient reports cuff was located. No petechiae, rashes, or other skin abnormalities noted. Skin warm and dry.   Psychiatric: Affect and judgment normal.      Assessment & Plan:  Petechiae Likely 2/2 minor trauma from ill-fitting or too tight blood pressure cuff, given petechiae only in distribution of cuff and improving since switching to appropriately sized cuff. Only few faint  petechiae noted on exam, otherwise no skin abnormalities. Denial of bleeding also reassuring and making ITP or other platelet-disorder less likely. Will obtain CBC today for patient reassurance, as she is concerned about any possible platelet abnormalities given her upcoming surgical procedure.  - CBC today - Will call patient with results   Adin Hector, MD, MPH PGY-3 [facility] Pager [phone]

## 2017-07-09 NOTE — Assessment & Plan Note (Signed)
Likely 2/2 minor trauma from ill-fitting or too tight blood pressure cuff, given petechiae only in distribution of cuff and improving since switching to appropriately sized cuff. Only few faint petechiae noted on exam, otherwise no skin abnormalities. Denial of bleeding also reassuring and making ITP or other platelet-disorder less likely. Will obtain CBC today for patient reassurance, as she is concerned about any possible platelet abnormalities given her upcoming surgical procedure.  - CBC today - Will call patient with results

## 2017-07-09 NOTE — Patient Instructions (Signed)
It was nice meeting you today Jacqueline Goodman!  I will call to let you know the results of your CBC when they are available.   You can continue using the blood pressure cuff you have now to monitor your blood pressure at home. Please follow-up with Dr. Erin Hearing as scheduled.   If you have any questions or concerns, please feel free to call the clinic.   Be well,  Dr. Avon Gully

## 2017-07-10 LAB — CBC
Hematocrit: 38.1 % (ref 34.0–46.6)
Hemoglobin: 13.1 g/dL (ref 11.1–15.9)
MCH: 29.1 pg (ref 26.6–33.0)
MCHC: 34.4 g/dL (ref 31.5–35.7)
MCV: 85 fL (ref 79–97)
Platelets: 221 10*3/uL (ref 150–379)
RBC: 4.5 x10E6/uL (ref 3.77–5.28)
RDW: 14.1 % (ref 12.3–15.4)
WBC: 8 10*3/uL (ref 3.4–10.8)

## 2017-07-11 ENCOUNTER — Encounter (INDEPENDENT_AMBULATORY_CARE_PROVIDER_SITE_OTHER): Payer: Medicare Other | Admitting: Ophthalmology

## 2017-07-11 DIAGNOSIS — H43813 Vitreous degeneration, bilateral: Secondary | ICD-10-CM

## 2017-07-11 DIAGNOSIS — D3132 Benign neoplasm of left choroid: Secondary | ICD-10-CM | POA: Diagnosis not present

## 2017-07-11 DIAGNOSIS — E11319 Type 2 diabetes mellitus with unspecified diabetic retinopathy without macular edema: Secondary | ICD-10-CM | POA: Diagnosis not present

## 2017-07-11 DIAGNOSIS — I10 Essential (primary) hypertension: Secondary | ICD-10-CM | POA: Diagnosis not present

## 2017-07-11 DIAGNOSIS — H35033 Hypertensive retinopathy, bilateral: Secondary | ICD-10-CM | POA: Diagnosis not present

## 2017-07-11 DIAGNOSIS — E113313 Type 2 diabetes mellitus with moderate nonproliferative diabetic retinopathy with macular edema, bilateral: Secondary | ICD-10-CM

## 2017-07-11 NOTE — Progress Notes (Signed)
Called patient to inform her of lab results. No answer. Left voicemail informing her that all lab results were normal. Encouraged to call back if any questions.   Adin Hector, MD, MPH PGY-3 [facility] Pager [phone]

## 2017-07-23 ENCOUNTER — Telehealth: Payer: Self-pay | Admitting: Family Medicine

## 2017-07-23 NOTE — Telephone Encounter (Signed)
Pt daughter called and said pt just had surgery yesterday and her blood sugar has been very high since. Her sugar has been above 300 since the surgery and it wont go down. The dr got on the phone while I was talking to the daughter and said pt's blood sugar medication needs to be uped so it will control her blood sugar. Please contact pt to inform of what needs to be done. Please advise

## 2017-07-24 NOTE — Telephone Encounter (Signed)
Pls let patient know that if her blood sugar are still high we would need to see her to determine if she has an infection or other cause for her high readings  Pls bring in her meter and a log of her readings Thaks

## 2017-07-25 NOTE — Telephone Encounter (Signed)
Spoke with pt daughter and her sugars aren't high anymore, they have been in the 80's. She also has been checking her temperature and she hasn't had a fever.

## 2017-07-27 HISTORY — PX: ABDOMINOPLASTY: SUR9

## 2017-08-02 ENCOUNTER — Other Ambulatory Visit: Payer: Self-pay | Admitting: Family Medicine

## 2017-08-06 ENCOUNTER — Encounter: Payer: Self-pay | Admitting: Family Medicine

## 2017-08-09 DIAGNOSIS — E162 Hypoglycemia, unspecified: Secondary | ICD-10-CM | POA: Diagnosis not present

## 2017-08-09 DIAGNOSIS — E161 Other hypoglycemia: Secondary | ICD-10-CM | POA: Diagnosis not present

## 2017-08-10 ENCOUNTER — Other Ambulatory Visit: Payer: Self-pay

## 2017-08-10 ENCOUNTER — Encounter (HOSPITAL_COMMUNITY): Payer: Self-pay

## 2017-08-10 ENCOUNTER — Inpatient Hospital Stay (HOSPITAL_COMMUNITY)
Admission: EM | Admit: 2017-08-10 | Discharge: 2017-08-21 | DRG: 862 | Disposition: A | Discharge status: Home / Self Care | Payer: Medicare Other | Attending: Family Medicine | Admitting: Family Medicine

## 2017-08-10 ENCOUNTER — Emergency Department (HOSPITAL_COMMUNITY): Payer: Medicare Other

## 2017-08-10 DIAGNOSIS — E11649 Type 2 diabetes mellitus with hypoglycemia without coma: Secondary | ICD-10-CM | POA: Diagnosis present

## 2017-08-10 DIAGNOSIS — R9431 Abnormal electrocardiogram [ECG] [EKG]: Secondary | ICD-10-CM | POA: Diagnosis not present

## 2017-08-10 DIAGNOSIS — J189 Pneumonia, unspecified organism: Secondary | ICD-10-CM | POA: Diagnosis not present

## 2017-08-10 DIAGNOSIS — I10 Essential (primary) hypertension: Secondary | ICD-10-CM | POA: Diagnosis present

## 2017-08-10 DIAGNOSIS — E162 Hypoglycemia, unspecified: Secondary | ICD-10-CM | POA: Diagnosis present

## 2017-08-10 DIAGNOSIS — Z885 Allergy status to narcotic agent status: Secondary | ICD-10-CM

## 2017-08-10 DIAGNOSIS — T8149XA Infection following a procedure, other surgical site, initial encounter: Secondary | ICD-10-CM | POA: Diagnosis not present

## 2017-08-10 DIAGNOSIS — Z7984 Long term (current) use of oral hypoglycemic drugs: Secondary | ICD-10-CM | POA: Diagnosis not present

## 2017-08-10 DIAGNOSIS — T8130XA Disruption of wound, unspecified, initial encounter: Secondary | ICD-10-CM | POA: Diagnosis not present

## 2017-08-10 DIAGNOSIS — F419 Anxiety disorder, unspecified: Secondary | ICD-10-CM | POA: Diagnosis not present

## 2017-08-10 DIAGNOSIS — I96 Gangrene, not elsewhere classified: Secondary | ICD-10-CM | POA: Diagnosis present

## 2017-08-10 DIAGNOSIS — T8131XA Disruption of external operation (surgical) wound, not elsewhere classified, initial encounter: Secondary | ICD-10-CM | POA: Diagnosis not present

## 2017-08-10 DIAGNOSIS — Z79899 Other long term (current) drug therapy: Secondary | ICD-10-CM

## 2017-08-10 DIAGNOSIS — E871 Hypo-osmolality and hyponatremia: Secondary | ICD-10-CM | POA: Diagnosis not present

## 2017-08-10 DIAGNOSIS — R112 Nausea with vomiting, unspecified: Secondary | ICD-10-CM | POA: Diagnosis present

## 2017-08-10 DIAGNOSIS — J181 Lobar pneumonia, unspecified organism: Secondary | ICD-10-CM

## 2017-08-10 DIAGNOSIS — E43 Unspecified severe protein-calorie malnutrition: Secondary | ICD-10-CM

## 2017-08-10 DIAGNOSIS — G4700 Insomnia, unspecified: Secondary | ICD-10-CM | POA: Diagnosis present

## 2017-08-10 DIAGNOSIS — E669 Obesity, unspecified: Secondary | ICD-10-CM | POA: Diagnosis not present

## 2017-08-10 DIAGNOSIS — Z6835 Body mass index (BMI) 35.0-35.9, adult: Secondary | ICD-10-CM

## 2017-08-10 DIAGNOSIS — R918 Other nonspecific abnormal finding of lung field: Secondary | ICD-10-CM | POA: Diagnosis not present

## 2017-08-10 DIAGNOSIS — R109 Unspecified abdominal pain: Secondary | ICD-10-CM | POA: Diagnosis not present

## 2017-08-10 DIAGNOSIS — E785 Hyperlipidemia, unspecified: Secondary | ICD-10-CM | POA: Diagnosis present

## 2017-08-10 DIAGNOSIS — E1169 Type 2 diabetes mellitus with other specified complication: Secondary | ICD-10-CM | POA: Diagnosis not present

## 2017-08-10 HISTORY — DX: Type 2 diabetes mellitus without complications: E11.9

## 2017-08-10 LAB — COMPREHENSIVE METABOLIC PANEL
ALT: 67 U/L — ABNORMAL HIGH (ref 14–54)
AST: 80 U/L — ABNORMAL HIGH (ref 15–41)
Albumin: 2.3 g/dL — ABNORMAL LOW (ref 3.5–5.0)
Alkaline Phosphatase: 161 U/L — ABNORMAL HIGH (ref 38–126)
Anion gap: 10 (ref 5–15)
BUN: 24 mg/dL — ABNORMAL HIGH (ref 6–20)
CO2: 20 mmol/L — ABNORMAL LOW (ref 22–32)
Calcium: 8.3 mg/dL — ABNORMAL LOW (ref 8.9–10.3)
Chloride: 98 mmol/L — ABNORMAL LOW (ref 101–111)
Creatinine, Ser: 0.74 mg/dL (ref 0.44–1.00)
GFR calc Af Amer: >60 mL/min (ref >60)
GFR calc non Af Amer: >60 mL/min (ref >60)
Glucose, Bld: 75 mg/dL (ref 65–99)
Potassium: 4.3 mmol/L (ref 3.5–5.1)
Sodium: 128 mmol/L — ABNORMAL LOW (ref 135–145)
Total Bilirubin: 0.4 mg/dL (ref 0.3–1.2)
Total Protein: 6.8 g/dL (ref 6.5–8.1)

## 2017-08-10 LAB — TYPE AND SCREEN
ABO/RH(D): A POS
Antibody Screen: NEGATIVE

## 2017-08-10 LAB — PROTIME-INR
INR: 1.07
Prothrombin Time: 13.8 seconds (ref 11.4–15.2)

## 2017-08-10 LAB — URINALYSIS, ROUTINE W REFLEX MICROSCOPIC
Bilirubin Urine: NEGATIVE
Glucose, UA: 50 mg/dL — AB
Ketones, ur: NEGATIVE mg/dL
Nitrite: NEGATIVE
Protein, ur: 100 mg/dL — AB
Specific Gravity, Urine: 1.019 (ref 1.005–1.030)
pH: 5 (ref 5.0–8.0)

## 2017-08-10 LAB — CBC WITH DIFFERENTIAL/PLATELET
Basophils Absolute: 0 10*3/uL (ref 0.0–0.1)
Basophils Relative: 0 %
Eosinophils Absolute: 0.1 10*3/uL (ref 0.0–0.7)
Eosinophils Relative: 0 %
HCT: 30.9 % — ABNORMAL LOW (ref 36.0–46.0)
Hemoglobin: 10.4 g/dL — ABNORMAL LOW (ref 12.0–15.0)
Lymphocytes Relative: 9 %
Lymphs Abs: 1.3 10*3/uL (ref 0.7–4.0)
MCH: 28.2 pg (ref 26.0–34.0)
MCHC: 33.7 g/dL (ref 30.0–36.0)
MCV: 83.7 fL (ref 78.0–100.0)
Monocytes Absolute: 1.4 10*3/uL — ABNORMAL HIGH (ref 0.1–1.0)
Monocytes Relative: 10 %
Neutro Abs: 11.7 10*3/uL — ABNORMAL HIGH (ref 1.7–7.7)
Neutrophils Relative %: 81 %
Platelets: 524 10*3/uL — ABNORMAL HIGH (ref 150–400)
RBC: 3.69 MIL/uL — ABNORMAL LOW (ref 3.87–5.11)
RDW: 12.5 % (ref 11.5–15.5)
WBC: 14.5 10*3/uL — ABNORMAL HIGH (ref 4.0–10.5)

## 2017-08-10 LAB — I-STAT CG4 LACTIC ACID, ED: Lactic Acid, Venous: 1.15 mmol/L (ref 0.5–1.9)

## 2017-08-10 LAB — GLUCOSE, CAPILLARY
Glucose-Capillary: 103 mg/dL — ABNORMAL HIGH (ref 65–99)
Glucose-Capillary: 106 mg/dL — ABNORMAL HIGH (ref 65–99)
Glucose-Capillary: 124 mg/dL — ABNORMAL HIGH (ref 65–99)
Glucose-Capillary: 205 mg/dL — ABNORMAL HIGH (ref 65–99)
Glucose-Capillary: 247 mg/dL — ABNORMAL HIGH (ref 65–99)
Glucose-Capillary: 97 mg/dL (ref 65–99)

## 2017-08-10 LAB — CBG MONITORING, ED
Glucose-Capillary: 122 mg/dL — ABNORMAL HIGH (ref 65–99)
Glucose-Capillary: 129 mg/dL — ABNORMAL HIGH (ref 65–99)
Glucose-Capillary: 149 mg/dL — ABNORMAL HIGH (ref 65–99)
Glucose-Capillary: 66 mg/dL (ref 65–99)
Glucose-Capillary: 71 mg/dL (ref 65–99)

## 2017-08-10 LAB — TROPONIN I: Troponin I: <0.03 ng/mL (ref <0.03)

## 2017-08-10 LAB — ABO/RH: ABO/RH(D): A POS

## 2017-08-10 MED ORDER — ONDANSETRON HCL 4 MG/2ML IJ SOLN
4.0000 mg | Freq: Once | INTRAMUSCULAR | Status: AC
  Administered 2017-08-10: 4 mg via INTRAVENOUS
  Filled 2017-08-10: qty 2

## 2017-08-10 MED ORDER — DEXTROSE-NACL 5-0.45 % IV SOLN
INTRAVENOUS | Status: DC
  Administered 2017-08-10: 05:00:00 via INTRAVENOUS

## 2017-08-10 MED ORDER — INSULIN ASPART 100 UNIT/ML ~~LOC~~ SOLN
0.0000 [IU] | SUBCUTANEOUS | Status: DC
  Administered 2017-08-10 – 2017-08-11 (×2): 1 [IU] via SUBCUTANEOUS
  Administered 2017-08-11: 3 [IU] via SUBCUTANEOUS

## 2017-08-10 MED ORDER — AZITHROMYCIN 500 MG PO TABS: 500.0000 mg | ORAL_TABLET | Freq: Every day | ORAL | Status: DC

## 2017-08-10 MED ORDER — PROCHLORPERAZINE EDISYLATE 5 MG/ML IJ SOLN
10.0000 mg | Freq: Four times a day (QID) | INTRAMUSCULAR | Status: DC | PRN
  Administered 2017-08-11 – 2017-08-21 (×4): 10 mg via INTRAVENOUS
  Filled 2017-08-10 (×4): qty 2

## 2017-08-10 MED ORDER — DEXTROSE 5 % IV SOLN
1.0000 g | Freq: Once | INTRAVENOUS | Status: AC
  Administered 2017-08-10: 1 g via INTRAVENOUS
  Filled 2017-08-10: qty 10

## 2017-08-10 MED ORDER — VANCOMYCIN HCL IN DEXTROSE 750-5 MG/150ML-% IV SOLN
750.0000 mg | Freq: Two times a day (BID) | INTRAVENOUS | Status: DC
  Administered 2017-08-11 – 2017-08-14 (×7): 750 mg via INTRAVENOUS
  Filled 2017-08-10 (×7): qty 150

## 2017-08-10 MED ORDER — DEXTROSE 5 % IV SOLN
1.0000 g | INTRAVENOUS | Status: DC
  Filled 2017-08-10: qty 10

## 2017-08-10 MED ORDER — VANCOMYCIN HCL 10 G IV SOLR
2000.0000 mg | Freq: Once | INTRAVENOUS | Status: AC
  Administered 2017-08-10: 2000 mg via INTRAVENOUS
  Filled 2017-08-10: qty 2000

## 2017-08-10 MED ORDER — DEXTROSE 50 % IV SOLN
50.0000 mL | Freq: Once | INTRAVENOUS | Status: AC
  Administered 2017-08-10: 50 mL via INTRAVENOUS
  Filled 2017-08-10: qty 50

## 2017-08-10 MED ORDER — ACETAMINOPHEN 325 MG PO TABS
650.0000 mg | ORAL_TABLET | Freq: Four times a day (QID) | ORAL | Status: DC | PRN
  Filled 2017-08-10 (×2): qty 2

## 2017-08-10 MED ORDER — OXYCODONE-ACETAMINOPHEN 5-325 MG PO TABS
1.0000 | ORAL_TABLET | ORAL | Status: DC | PRN
  Administered 2017-08-10: 1 via ORAL
  Administered 2017-08-10: 2 via ORAL
  Administered 2017-08-10: 1 via ORAL
  Administered 2017-08-11 – 2017-08-13 (×6): 2 via ORAL
  Administered 2017-08-13: 1 via ORAL
  Administered 2017-08-13 – 2017-08-21 (×18): 2 via ORAL
  Filled 2017-08-10: qty 1
  Filled 2017-08-10 (×2): qty 2
  Filled 2017-08-10: qty 1
  Filled 2017-08-10 (×13): qty 2
  Filled 2017-08-10: qty 1
  Filled 2017-08-10 (×10): qty 2

## 2017-08-10 MED ORDER — PIPERACILLIN-TAZOBACTAM 3.375 G IVPB 30 MIN
3.3750 g | Freq: Once | INTRAVENOUS | Status: AC
  Administered 2017-08-10: 3.375 g via INTRAVENOUS
  Filled 2017-08-10: qty 50

## 2017-08-10 MED ORDER — DEXTROSE-NACL 5-0.45 % IV SOLN
INTRAVENOUS | Status: AC
  Administered 2017-08-10: 14:00:00 via INTRAVENOUS

## 2017-08-10 MED ORDER — POLYETHYLENE GLYCOL 3350 17 G PO PACK: 17.0000 g | PACK | Freq: Every day | ORAL | Status: DC | PRN

## 2017-08-10 MED ORDER — DEXTROSE 5 % IV SOLN
500.0000 mg | Freq: Once | INTRAVENOUS | Status: AC
  Administered 2017-08-10: 500 mg via INTRAVENOUS
  Filled 2017-08-10: qty 500

## 2017-08-10 MED ORDER — ACETAMINOPHEN 650 MG RE SUPP: 650.0000 mg | Freq: Four times a day (QID) | RECTAL | Status: DC | PRN

## 2017-08-10 MED ORDER — LOSARTAN POTASSIUM 50 MG PO TABS
75.0000 mg | ORAL_TABLET | Freq: Every day | ORAL | Status: DC
  Administered 2017-08-10 – 2017-08-12 (×3): 75 mg via ORAL
  Filled 2017-08-10 (×3): qty 1

## 2017-08-10 MED ORDER — PIPERACILLIN-TAZOBACTAM 3.375 G IVPB
3.3750 g | Freq: Three times a day (TID) | INTRAVENOUS | Status: DC
  Administered 2017-08-11 – 2017-08-16 (×16): 3.375 g via INTRAVENOUS
  Filled 2017-08-10 (×20): qty 50

## 2017-08-10 MED ORDER — ENOXAPARIN SODIUM 60 MG/0.6ML ~~LOC~~ SOLN
0.5000 mg/kg | SUBCUTANEOUS | Status: DC
  Administered 2017-08-10 – 2017-08-12 (×3): 50 mg via SUBCUTANEOUS
  Filled 2017-08-10 (×3): qty 0.6

## 2017-08-10 NOTE — Progress Notes (Signed)
Family Medicine consulted.  Expressed concern about undressing extensive wounds without MD/Surgeon being present.  Per patient's daughter, dressings have only been done in the plastic surgeon's office.  Multiple dressing with extensive drainage.  WOC Consult entered.  MD okay with waiting to assess wound when WOC or MD/Surgeon present.  Patient and daughter advised and in agreement  Will continue to monitor.  Earleen Reaper RN-BC, [employer]

## 2017-08-10 NOTE — ED Notes (Signed)
Attempted to call report

## 2017-08-10 NOTE — ED Notes (Signed)
Patient transported to X-ray 

## 2017-08-10 NOTE — Progress Notes (Signed)
Spoke with MD about dressing change, waiting for resident to come up and assist with dressing change

## 2017-08-10 NOTE — H&P (Signed)
[facility] Admission History and Physical Service Pager: [phone]  Patient name: Jacqueline Goodman Medical record number: [id number] Date of birth: 08/24/1951 Age: 66 y.o. Gender: female  Primary Care Provider: Chambliss, Marshall L, MD Consultants: none Code Status: full  Chief Complaint: weakness  Assessment and Plan: [name] is a 66 y.o. female presenting with hypoglycemia and generalized weakness found to have pneumonia. PMH is significant for T2DM, HTN, HLD, s/p tummy tuck 07/22/2017.  Hypoglycemia: Hypoglycemia in the setting of use of sulfonylureas with decreased appetite and acute infection. Patient became unresponsive earlier tonight after going to bed.  Daughter checked blood sugar and it was 25 and when EMS arrived her blood sugar was 29.  She was given 1 amp of D50 by EMS which brought her sugars to 71.  In the ED her sugars dropped again to 66 and patient was then given another amp of D50 which brought it up to 129.  Patient reports in the past few days that her sugars have been lower than normal.  Daughter reports that since her tummy tuck on 11/26 patient has had very little appetite.  Patient also with recent cough and increased work of breathing.  CXR on admission suggestive of pneumonia.  Hypoglycemia also likely in the setting of infection.  In the ED, WBC 14.5, neutrophils 11.7, monocytes 1.4. -Admit to inpatient, attending Dr. McIntyre -D5 1/2NS at 75 mL/hr for 12 hours -Continue to monitor CBGs q4 -Will hold home glimepiride -Troponin negative x1, normal EKG -Urine culture and Blood culture to rule out other sources of infection  Community Acquired Pneumonia: CXR suggestive of pneumonia in the setting of leukocytosis with increased neutrophils.  Patient with history of cough and increased work of breathing the past few days.  Given azithromycin and CTX x1 in the ED. Did not meet SIRS criteria. -Continue azithromycin (12/15-), CTX  (12/15-) -Monitor WBC on CBC -Monitor O2 sats and give supplemental oxygen if sats below 92%  S/p recent tummy tuck: on 11/26. Patient previously on duricef 500mg BID 11/27-12/2. Wounds appears that it is healing well on exam, no obvious signs of infection.  Wrapped in dressing and in an abdominal binder. Patient with leg pain since her surgery, likely related to the binder. Will continue to monitor.  -Wound consult -Percocet 5-325mg q4 prn pain  T2DM: Well controlled with last hemoglobin A1c of 6.5 in October 2018. -We will hold home glimepiride -SSI and CBGs every 4 hours -On D5 1/2 NS at 75 mL/hr for 12 hours in the setting of hypoglycemia  Insomnia: Trouble sleeping, especially since her surgery. Patient taking benadryl 25mg every night for sleep. -Will hold benadryl -Consider ramelteon or trazodone for sleep if needed  HTN: Normotensive in the ED. -Continue home losartan 25mg -Monitor  FEN/GI: carb modified diet Prophylaxis: Lovenox, SCDs  Disposition: Admit to inpatient  History of Present Illness:  Jacqueline Goodman is a 66 y.o. female presenting with hypoglycemia and weakness. Daughter reports that she was not responsive earlier tonight. Her daughter checked her blood sugar and it was 25 so she called EMS. EMS found her sugar to be 29 on arrival. EMS gave an amp of D50 which brought her sugar to 71.   Patient had a tummy tuck on 11/26, and had a very poor appetite since. Half a sweet potato, half a scoop of ice cream, half a banana for dinner. Went to bed around 6pm. Daughter has been staying on air mattress to take care   of her at foot of bed. She heard a gurgling sound that wasn't quite right. Daughter reports she was snoring, her eyes were real big, she was not responding, was clammy to touch, and was not breathing right.   Last took glimepiride yesterday morning 10:30am. Last night sugar reading was 68 and 43. On 12/12 fasting sugars dropping from 107 to the 60s. Patient also  is taking oxycodone 5-325 1 pill BID prn, Losartan 53m and benadyrl 1 pill at night usually since surgery since was having trouble sleeping. Reports having a subjective fever chills for the last few days with cough, slightly productive but has post nasal drip. Had some occasional about of heavier breathing over the last few weeks on exertion which resolved with deeper breathing. Reports that the drainage is a little more cloudy over the last 2 days. Patient previously on duricef 5050mBID 11/27-12/2.  Review Of Systems: Per HPI with the following additions:   Review of Systems  Constitutional: Positive for chills. Negative for fever.  HENT: Positive for congestion.   Eyes: Negative for blurred vision and double vision.  Respiratory: Positive for cough, sputum production and shortness of breath.   Cardiovascular: Positive for leg swelling. Negative for chest pain.  Gastrointestinal: Positive for nausea. Negative for vomiting.  Genitourinary: Negative for dysuria.  Musculoskeletal:       Leg pain  Neurological: Positive for weakness and headaches.    Patient Active Problem List   Diagnosis Date Noted  . Hypoglycemia 08/10/2017  . Petechiae 07/09/2017  . 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 Situational anxiety 03/13/2017  . Hyperlipidemia 07/05/2016  . Essential hypertension 06/28/2016  . Diabetes mellitus type 2 in obese ([place]10/07/2016    Past Medical History: Past Medical History:  Diagnosis Date  . Diabetes mellitus without complication ([place]  . Hemorrhoids     Past Surgical History: Past Surgical History:  Procedure Laterality Date  . ABDOMINAL HYSTERECTOMY     PARTIAL  . CHOLECYSTECTOMY      Social History: Social History   Tobacco Use  . Smoking status: Never Smoker  . Smokeless tobacco: Never Used  Substance Use Topics  . Alcohol use: No  . Drug use: No   Additional social history: Lives with husband and 2 daughters.  Please also refer to relevant sections  of EMR.  Family History: Family History  Problem Relation Age of Onset  . Cancer Mother   . Breast cancer Mother   . Heart disease Father   . Alcohol abuse Father   . Diabetes Father   . Alcohol abuse Sister   . Heart disease Sister     Allergies and Medications: Allergies  Allergen Reactions  . Morphine And Related Other (See Comments)    "says doesn't work"   No current facility-administered medications on file prior to encounter.    Current Outpatient Medications on File Prior to Encounter  Medication Sig Dispense Refill  . diphenhydrAMINE (BENADRYL) 25 mg capsule Take 25 mg by mouth at bedtime.    . Marland Kitchenlimepiride (AMARYL) 4 MG tablet TAKE 1 TABLET (4 MG TOTAL) BY MOUTH DAILY WITH BREAKFAST. 90 tablet 1  . losartan (COZAAR) 25 MG tablet Take 3 tablets (75 mg total) by mouth daily. 90 tablet 0  . oxyCODONE-acetaminophen (PERCOCET/ROXICET) 5-325 MG tablet Take 1-2 tablets by mouth every 4 (four) hours as needed for severe pain.    . Blood Glucose Monitoring Suppl (ONE TOUCH ULTRA 2) w/Device KIT Check blood sugar once every morning before eating.  ICD-10 Code: E11.9 1 each 0  . glucose blood (ONE TOUCH ULTRA TEST) test strip Use to test blood sugar once every morning before eating. ICD-10 Code: E11.9 100 each 3    Objective: BP (!) 122/102   Pulse 87   Temp 97.7 F (36.5 C) (Oral)   Resp (!) 22   Ht 5' 9" (1.753 m)   Wt 239 lb (108.4 kg)   SpO2 100%   BMI 35.29 kg/m  Exam: General: NAD, pleasant Eyes: PERRL, EOMI, no conjunctival pallor or injection ENTM: Moist mucous membranes, no pharyngeal erythema or exudate Neck: Supple, no LAD Cardiovascular: RRR, no m/r/g, 2+ pitting edema on LLE, 2+ pedal pulses BL Respiratory: CTABL (hard to appreciate due to body habitus), normal work of breathing, tachypneic  Gastrointestinal: wrap around abdomen with drainage through J tube from previous tummy tuck, wound appears to be healing well.  MSK: moves 4 extremities equally Derm:  no rashes appreciated Neuro: CN II-XII grossly intact Psych: AOx3, appropriate affect  Labs and Imaging: CBC BMET  Recent Labs  Lab 08/10/17 0118  WBC 14.5*  HGB 10.4*  HCT 30.9*  PLT 524*   Recent Labs  Lab 08/10/17 0118  NA 128*  K 4.3  CL 98*  CO2 20*  BUN 24*  CREATININE 0.74  GLUCOSE 75  CALCIUM 8.3*     Dg Chest 2 View  Result Date: 08/10/2017 CLINICAL DATA:  Acute onset of hypoglycemia.  Lack of appetite. EXAM: CHEST  2 VIEW COMPARISON:  None. FINDINGS: The lungs are well-aerated. Mild basilar opacity is suggested on the lateral view. Would correlate for evidence of pneumonia. There is no evidence of pleural effusion or pneumothorax. The heart is borderline normal in size. No acute osseous abnormalities are seen. IMPRESSION: Mild basilar airspace opacity suggested on the lateral view. Would correlate clinically for evidence of pneumonia. Electronically Signed   By: Jeffery  Chang M.D.   On: 08/10/2017 03:31    Shirley, Jordan, DO 08/10/2017, 5:55 AM PGY-1, [facility] FPTS Intern pager: [phone], text pages welcome  FPTS Upper-Level Resident Addendum  I have independently interviewed and examined the patient. I have discussed the above with the original author and agree with their documentation. My edits for correction/addition/clarification are in blue. Please see also any attending notes.    J , DO PGY-2, [facility] 08/10/2017 7:36 AM  FPTS Service pager: [phone] (text pages welcome through [facility]) 

## 2017-08-10 NOTE — ED Notes (Signed)
CBG 149. 

## 2017-08-10 NOTE — ED Triage Notes (Signed)
Per [facility], pt found by daughter at home snoring. Daughter checked her sugar and it was 29. [facility] glucometer wouldn't read which means less than 10. [facility] gave an amp of D50 and latest sugar at 2350 was 100. Pt normally has sugar in 300's, but had tummy tuck 15 days ago. [facility] reports ever since her sugars have been low and had lack of appetite. Pt is alert and oriented x3. Pt is disoriented to time. CBG here is 71.

## 2017-08-10 NOTE — ED Notes (Signed)
JP drain present to left side of abdomen.

## 2017-08-10 NOTE — ED Notes (Signed)
CBG 71.  

## 2017-08-10 NOTE — Progress Notes (Signed)
FPTS Interim Progress Note  S: Went by earlier this evening to look at wound during dressing change. Wound appeared changed from Thursday per photograph on patient's daughter's phone. Patient denies pain to area. Daughter notes drainage from area. Notes odor on abdominal binder and endorses slight odor on wound. Denies fever or chills. Per my exam area had purulent drainage and dehiscence. No pain on palpation.   O: BP (!) 106/49 (BP Location: Right Arm)   Pulse 86   Temp 99.2 F (37.3 C) (Oral)   Resp 18   Ht 5\' 9"  (1.753 m)   Wt 225 lb (102.1 kg)   SpO2 97%   BMI 33.23 kg/m   Skin:     A/P: Wound -will consult wound care -will contact plastic surgeon, appreciate recommendations  -CT abdomen pelvis wo contrast  -vanc/zosyn for broad spectrum coverage  -continue to monitor   Caroline More, DO 08/10/2017, 8:55 PM PGY-1, [facility] Service pager [phone]

## 2017-08-10 NOTE — Progress Notes (Signed)
Pharmacy Antibiotic Note  Jacqueline Goodman is a 66 y.o. female admitted on 08/10/2017 with concern of wound infection and possible pneumonia (patient noted with recent tummy tuck and with abdominal wound).  Pharmacy has been consulted for vancomycin/zosyn dosing. -WBC= 14.5, afebrile, SCr= 0.74 and CrCl ~ 90  Plan: -Zosyn 3.375gm IV q8h -vancomycin 2000mg  IV x1 followed by 750mg  IV q12h -Will follow renal function, cultures and clinical progress   Height: 5\' 9"  (175.3 cm) Weight: 225 lb (102.1 kg) IBW/kg (Calculated) : 66.2  Temp (24hrs), Avg:98.9 F (37.2 C), Min:97.7 F (36.5 C), Max:99.5 F (37.5 C)  Recent Labs  Lab 08/10/17 0118 08/10/17 0140  WBC 14.5*  --   CREATININE 0.74  --   LATICACIDVEN  --  1.15    Estimated Creatinine Clearance: 88 mL/min (by C-G formula based on SCr of 0.74 mg/dL).    Allergies  Allergen Reactions  . Morphine And Related Other (See Comments)    "says doesn't work"    Antimicrobials this admission: 12/15 vanc 12/15 zosyn  Dose adjustments this admission:   Microbiology results: 12/15 urine 12/15 blood x2  Thank you for allowing pharmacy to be a part of this patient's care.  Hildred Laser, Pharm D 08/10/2017 9:03 PM

## 2017-08-10 NOTE — ED Provider Notes (Signed)
[facility] EMERGENCY DEPARTMENT Provider Note   CSN: [id number] Arrival date & time: 08/10/17  0020     History   Chief Complaint Chief Complaint  Patient presents with  . Hypoglycemia    HPI Jacqueline Goodman is a 66 y.o. female.  Patient from home with episode of decreased responsiveness and hypoglycemia.  Patient states she remembers going to bed and the next thing she knows there are lots of people in her room.  Her daughter was at the foot of her bed and said she was snoring and having difficulty breathing and was not responding.  Her blood sugar was 29.  EMS gave an amp of D50.  Sugar has increased to 71.  Patient is a diabetic and takes Amaryl once a day.  She had a poor appetite for the past several weeks.  She recently underwent a tummy tuck operation November 26 by Dr. Nathanial Rancher.  They report she has been doing well at her postop visits.  She has not had a fever.  She had a poor appetite ever since her operation.  No vomiting or diarrhea.  She still has 1 JP drain in place which is draining about 150 cc a day which they were told was normal.  Family describes she has had a cough and shortness of breath and generalized weakness.   The history is provided by the patient, the EMS personnel and a relative. The history is limited by the condition of the patient.  Hypoglycemia  Associated symptoms: dizziness and weakness   Associated symptoms: no shortness of breath and no vomiting     Past Medical History:  Diagnosis Date  . Diabetes mellitus without complication ([place])   . Hemorrhoids     Patient Active Problem List   Diagnosis Date Noted  . Petechiae 07/09/2017  . 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 Situational anxiety 03/13/2017  . Hyperlipidemia 07/05/2016  . Essential hypertension 06/28/2016  . Diabetes mellitus type 2 in obese ([place]) 06/07/2016    Past Surgical History:  Procedure Laterality Date  . ABDOMINAL HYSTERECTOMY     PARTIAL  .  CHOLECYSTECTOMY      OB History    No data available       Home Medications    Prior to Admission medications   Medication Sig Start Date End Date Taking? Authorizing Provider  Blood Glucose Monitoring Suppl (ONE TOUCH ULTRA 2) w/Device KIT Check blood sugar once every morning before eating. ICD-10 Code: E11.9 09/11/16   Lind Covert, MD  glimepiride (AMARYL) 4 MG tablet TAKE 1 TABLET (4 MG TOTAL) BY MOUTH DAILY WITH BREAKFAST. 08/06/17   Chambliss, Jeb Levering, MD  glucose blood (ONE TOUCH ULTRA TEST) test strip Use to test blood sugar once every morning before eating. ICD-10 Code: E11.9 03/13/17   Lind Covert, MD  losartan (COZAAR) 25 MG tablet Take 3 tablets (75 mg total) by mouth daily. 06/05/17   Lind Covert, MD    Family History Family History  Problem Relation Age of Onset  . Cancer Mother   . Breast cancer Mother   . Heart disease Father   . Alcohol abuse Father   . Diabetes Father   . Alcohol abuse Sister   . Heart disease Sister     Social History Social History   Tobacco Use  . Smoking status: Never Smoker  . Smokeless tobacco: Never Used  Substance Use Topics  . Alcohol use: No  . Drug use: No  Allergies   Morphine and related   Review of Systems Review of Systems  Constitutional: Positive for activity change, appetite change and fatigue.  HENT: Negative for congestion and rhinorrhea.   Eyes: Negative for visual disturbance.  Respiratory: Negative for cough, chest tightness and shortness of breath.   Cardiovascular: Negative for chest pain.  Gastrointestinal: Positive for abdominal pain. Negative for nausea and vomiting.  Genitourinary: Negative for dysuria, genital sores, hematuria, vaginal bleeding and vaginal discharge.  Musculoskeletal: Positive for arthralgias and myalgias. Negative for neck pain and neck stiffness.  Skin: Negative for rash.  Neurological: Positive for dizziness, weakness and light-headedness.  Negative for numbness and headaches.   all other systems are negative except as noted in the HPI and PMH.     Physical Exam Updated Vital Signs BP (!) 161/69 (BP Location: Right Arm)   Pulse 92   Temp 97.7 F (36.5 C) (Oral)   Ht _0  (1.753 m)   Wt 108.4 kg (239 lb)   SpO2 100%   BMI 35.29 kg/m   Physical Exam  Constitutional: She is oriented to person, place, and time. She appears well-developed and well-nourished. No distress.  Pale appearing, dyspneic with conversation  HENT:  Head: Normocephalic and atraumatic.  Mouth/Throat: Oropharynx is clear and moist. No oropharyngeal exudate.  Eyes: Conjunctivae and EOM are normal. Pupils are equal, round, and reactive to light.  Neck: Normal range of motion. Neck supple.  No meningismus.  Cardiovascular: Normal rate, regular rhythm, normal heart sounds and intact distal pulses. Exam reveals no gallop.  No murmur heard. Pulmonary/Chest: Effort normal and breath sounds normal. No respiratory distress. She exhibits no tenderness.  Abdominal: Soft. There is no tenderness. There is no rebound and no guarding.  Patient with large lower abdominal incision that extends across her abdomen.  There is a JP drain in place with serosanguineous drainage Appropriate amount of tenderness  Musculoskeletal: Normal range of motion. She exhibits no edema or tenderness.  Neurological: She is alert and oriented to person, place, and time. No cranial nerve deficit. She exhibits normal muscle tone. Coordination normal.   5/5 strength throughout. CN 2-12 intact.Equal grip strength.   Skin: Skin is warm.  Psychiatric: She has a normal mood and affect. Her behavior is normal.  Nursing note and vitals reviewed.    ED Treatments / Results  Labs (all labs ordered are listed, but only abnormal results are displayed) Labs Reviewed  CBC WITH DIFFERENTIAL/PLATELET - Abnormal; Notable for the following components:      Result Value   WBC 14.5 (*)    RBC  3.69 (*)    Hemoglobin 10.4 (*)    HCT 30.9 (*)    Platelets 524 (*)    Neutro Abs 11.7 (*)    Monocytes Absolute 1.4 (*)    All other components within normal limits  COMPREHENSIVE METABOLIC PANEL - Abnormal; Notable for the following components:   Sodium 128 (*)    Chloride 98 (*)    CO2 20 (*)    BUN 24 (*)    Calcium 8.3 (*)    Albumin 2.3 (*)    AST 80 (*)    ALT 67 (*)    Alkaline Phosphatase 161 (*)    All other components within normal limits  CBG MONITORING, ED - Abnormal; Notable for the following components:   Glucose-Capillary 149 (*)    All other components within normal limits  CBG MONITORING, ED - Abnormal; Notable for the following components:  Glucose-Capillary 129 (*)    All other components within normal limits  CBG MONITORING, ED - Abnormal; Notable for the following components:   Glucose-Capillary 122 (*)    All other components within normal limits  CULTURE, BLOOD (ROUTINE X 2)  CULTURE, BLOOD (ROUTINE X 2)  URINE CULTURE  TROPONIN I  PROTIME-INR  URINALYSIS, ROUTINE W REFLEX MICROSCOPIC  CBG MONITORING, ED  I-STAT CG4 LACTIC ACID, ED  CBG MONITORING, ED  CBG MONITORING, ED  TYPE AND SCREEN  ABO/RH    EKG  EKG Interpretation  Date/Time:  Saturday August 10 2017 01:28:54 EST Ventricular Rate:  91 PR Interval:    QRS Duration: 139 QT Interval:  395 QTC Calculation: 486 R Axis:   62 Text Interpretation:  Sinus rhythm Right bundle branch block Baseline wander in lead(s) V4 V6 decreased voltage  Confirmed by Ezequiel Essex [phone]) on 08/10/2017 1:35:56 AM       Radiology Dg Chest 2 View  Result Date: 08/10/2017 CLINICAL DATA:  Acute onset of hypoglycemia.  Lack of appetite. EXAM: CHEST  2 VIEW COMPARISON:  None. FINDINGS: The lungs are well-aerated. Mild basilar opacity is suggested on the lateral view. Would correlate for evidence of pneumonia. There is no evidence of pleural effusion or pneumothorax. The heart is borderline normal in  size. No acute osseous abnormalities are seen. IMPRESSION: Mild basilar airspace opacity suggested on the lateral view. Would correlate clinically for evidence of pneumonia. Electronically Signed   By: Garald Balding M.D.   On: 08/10/2017 03:31    Procedures Procedures (including critical care time)  Medications Ordered in ED Medications  ondansetron (ZOFRAN) injection 4 mg (not administered)     Initial Impression / Assessment and Plan / ED Course  I have reviewed the triage vital signs and the nursing notes.  Pertinent labs & imaging results that were available during my care of the patient were reviewed by me and considered in my medical decision making (see chart for details).    patient home with generalized weakness and episode of decreased responsiveness and associated hypoglycemia.  She is a diabetic on Amaryl.  Has had poor p.o. intake since her tummy tuck operation on November 26.  No fever.  Patient is awake and alert on arrival.  She denies pain other than her usual postoperative pain.  Has had a mild shortness of breath and cough.  Patient given IV dextrose on arrival.  Labs obtained that show persistent hypoglycemia and D5 infusion is begun.  Chest x-ray concerning for basilar infiltrate.  Given her cough and generalized weakness we will treat for pneumonia. Hemoglobin has dropped 3 g in past month. Likely related to surgery. Denies dark or bloody stools.    suspect her hypoglycemia due to poor intake in setting of infection.  We will continue D5 infusion.  Would benefit from observation given her deconditioning and active infection.  Discussed with family practice residents.  CRITICAL CARE Performed by: Ezequiel Essex Total critical care time: 40 minutes Critical care time was exclusive of separately billable procedures and treating other patients. Critical care was necessary to treat or prevent imminent or life-threatening deterioration. Critical care was time  spent personally by me on the following activities: development of treatment plan with patient and/or surrogate as well as nursing, discussions with consultants, evaluation of patient's response to treatment, examination of patient, obtaining history from patient or surrogate, ordering and performing treatments and interventions, ordering and review of laboratory studies, ordering and review of radiographic studies, pulse  oximetry and re-evaluation of patient's condition.   Final Clinical Impressions(s) / ED Diagnoses   Final diagnoses:  None    ED Discharge Orders    None       Indy Prestwood, Annie Main, MD 08/10/17 [phone]

## 2017-08-11 ENCOUNTER — Encounter (HOSPITAL_COMMUNITY): Payer: Self-pay | Admitting: *Deleted

## 2017-08-11 ENCOUNTER — Other Ambulatory Visit: Payer: Self-pay

## 2017-08-11 ENCOUNTER — Inpatient Hospital Stay (HOSPITAL_COMMUNITY): Payer: Medicare Other

## 2017-08-11 LAB — BASIC METABOLIC PANEL
Anion gap: 8 (ref 5–15)
BUN: 27 mg/dL — ABNORMAL HIGH (ref 6–20)
CO2: 22 mmol/L (ref 22–32)
Calcium: 7.8 mg/dL — ABNORMAL LOW (ref 8.9–10.3)
Chloride: 99 mmol/L — ABNORMAL LOW (ref 101–111)
Creatinine, Ser: 0.92 mg/dL (ref 0.44–1.00)
GFR calc Af Amer: >60 mL/min (ref >60)
GFR calc non Af Amer: >60 mL/min (ref >60)
Glucose, Bld: 107 mg/dL — ABNORMAL HIGH (ref 65–99)
Potassium: 4.2 mmol/L (ref 3.5–5.1)
Sodium: 129 mmol/L — ABNORMAL LOW (ref 135–145)

## 2017-08-11 LAB — HEPATIC FUNCTION PANEL
ALT: 52 U/L (ref 14–54)
AST: 44 U/L — ABNORMAL HIGH (ref 15–41)
Albumin: 1.8 g/dL — ABNORMAL LOW (ref 3.5–5.0)
Alkaline Phosphatase: 125 U/L (ref 38–126)
Bilirubin, Direct: <0.1 mg/dL — ABNORMAL LOW (ref 0.1–0.5)
Total Bilirubin: 0.4 mg/dL (ref 0.3–1.2)
Total Protein: 5.6 g/dL — ABNORMAL LOW (ref 6.5–8.1)

## 2017-08-11 LAB — GLUCOSE, CAPILLARY
Glucose-Capillary: 101 mg/dL — ABNORMAL HIGH (ref 65–99)
Glucose-Capillary: 137 mg/dL — ABNORMAL HIGH (ref 65–99)
Glucose-Capillary: 137 mg/dL — ABNORMAL HIGH (ref 65–99)
Glucose-Capillary: 168 mg/dL — ABNORMAL HIGH (ref 65–99)
Glucose-Capillary: 204 mg/dL — ABNORMAL HIGH (ref 65–99)
Glucose-Capillary: 215 mg/dL — ABNORMAL HIGH (ref 65–99)

## 2017-08-11 LAB — CBC
HCT: 27.4 % — ABNORMAL LOW (ref 36.0–46.0)
Hemoglobin: 9 g/dL — ABNORMAL LOW (ref 12.0–15.0)
MCH: 27.6 pg (ref 26.0–34.0)
MCHC: 32.8 g/dL (ref 30.0–36.0)
MCV: 84 fL (ref 78.0–100.0)
Platelets: 503 10*3/uL — ABNORMAL HIGH (ref 150–400)
RBC: 3.26 MIL/uL — ABNORMAL LOW (ref 3.87–5.11)
RDW: 12.8 % (ref 11.5–15.5)
WBC: 13.8 10*3/uL — ABNORMAL HIGH (ref 4.0–10.5)

## 2017-08-11 LAB — URINE CULTURE: Culture: NO GROWTH

## 2017-08-11 MED ORDER — BACITRACIN ZINC 500 UNIT/GM EX OINT
TOPICAL_OINTMENT | Freq: Once | CUTANEOUS | Status: AC
  Administered 2017-08-11: 18:00:00 via TOPICAL
  Filled 2017-08-11: qty 28.35

## 2017-08-11 MED ORDER — INSULIN ASPART 100 UNIT/ML ~~LOC~~ SOLN
0.0000 [IU] | Freq: Three times a day (TID) | SUBCUTANEOUS | Status: DC
  Administered 2017-08-11: 1 [IU] via SUBCUTANEOUS
  Administered 2017-08-11 (×2): 3 [IU] via SUBCUTANEOUS
  Administered 2017-08-12 (×2): 2 [IU] via SUBCUTANEOUS
  Administered 2017-08-12: 1 [IU] via SUBCUTANEOUS
  Administered 2017-08-13: 3 [IU] via SUBCUTANEOUS
  Administered 2017-08-13 – 2017-08-14 (×4): 2 [IU] via SUBCUTANEOUS
  Administered 2017-08-14: 3 [IU] via SUBCUTANEOUS
  Administered 2017-08-14: 2 [IU] via SUBCUTANEOUS
  Administered 2017-08-15: 1 [IU] via SUBCUTANEOUS
  Administered 2017-08-15 – 2017-08-16 (×3): 2 [IU] via SUBCUTANEOUS

## 2017-08-11 MED ORDER — IOPAMIDOL (ISOVUE-300) INJECTION 61%
15.0000 mL | INTRAVENOUS | Status: AC
  Administered 2017-08-11: 15 mL via ORAL

## 2017-08-11 MED ORDER — IOPAMIDOL (ISOVUE-300) INJECTION 61%
INTRAVENOUS | Status: AC
  Administered 2017-08-11: 100 mL
  Filled 2017-08-11: qty 100

## 2017-08-11 NOTE — Evaluation (Signed)
Physical Therapy Evaluation Patient Details Name: Jacqueline Goodman MRN: [id number] DOB: 01-Nov-1950 Today's Date: 08/11/2017   History of Present Illness  Jacqueline Goodman is a 66 y.o. female presenting with hypoglycemia and generalized weakness found to have pneumonia. Recent tummy tuck incision with drain infected and dehiscence.  PMH is significant for T2DM, HTN, HLD, s/p tummy tuck 07/22/2017  Clinical Impression  Pt admitted with above diagnosis. Pt currently with functional limitations due to the deficits listed below (see PT Problem List). Pt was able to ambulate to bathroom with 1 person assist with need for steadying assist for safety.  Refuses to use RW as she states she has fallen with a RW.  Recommend HHPT to assist family in care of pt.  Will follow acutely.  Pt will benefit from skilled PT to increase their independence and safety with mobility to allow discharge to the venue listed below.      Follow Up Recommendations Home health PT;Supervision/Assistance - 24 hour    Equipment Recommendations  Other (comment)(TBA)    Recommendations for Other Services       Precautions / Restrictions Precautions Precautions: Fall Restrictions Weight Bearing Restrictions: No      Mobility  Bed Mobility Overal bed mobility: Needs Assistance Bed Mobility: Rolling;Sidelying to Sit Rolling: Min assist Sidelying to sit: Mod assist       General bed mobility comments: Needed mod assist to elevate trunk but does do log roll without cues  Transfers Overall transfer level: Needs assistance Equipment used: 1 person hand held assist Transfers: Sit to/from Stand Sit to Stand: Min assist         General transfer comment: Pt needed assist to power up  Ambulation/Gait Ambulation/Gait assistance: Min assist Ambulation Distance (Feet): 30 Feet Assistive device: 1 person hand held assist Gait Pattern/deviations: Step-through pattern;Decreased stride length   Gait velocity  interpretation: Below normal speed for age/gender General Gait Details: Pt was able to walk into bathroom with 1 person assist most of time, occasional 2 person HHA as son was in room and pt asking him to help.  Pt steady on her feet with 1 UE support. Reports knees buckle at times.  one episode of knee instability but recovered with min guard assist.   Stairs            Wheelchair Mobility    Modified Rankin (Stroke Patients Only)       Balance Overall balance assessment: Needs assistance Sitting-balance support: No upper extremity supported;Feet supported Sitting balance-Leahy Scale: Fair     Standing balance support: During functional activity;Single extremity supported Standing balance-Leahy Scale: Poor Standing balance comment: can stand statically but needs at least 1 UE supported.                              Pertinent Vitals/Pain Pain Assessment: 0-10 Pain Score: 6  Pain Location: abdomen Pain Descriptors / Indicators: Aching;Grimacing;Guarding Pain Intervention(s): Limited activity within patient's tolerance;Monitored during session;Premedicated before session;Repositioned  VSS  Home Living Family/patient expects to be discharged to:: Private residence Living Arrangements: Spouse/significant other Available Help at Discharge: Family;Available 24 hours/day(husband and daughter 14 years old and 33 year old granddaught) Type of Home: House Home Access: Stairs to enter Entrance Stairs-Rails: Right;Left;Can reach both Entrance Stairs-Number of Steps: 3 Home Layout: One level Home Equipment: Walker - 2 wheels;Cane - single point;Tub bench      Prior Function Level of Independence: Needs assistance   Gait /  Transfers Assistance Needed: husband was pushing pt in office chair because she was dizzy and fell 2 x with walker  ADL's / Homemaking Assistance Needed: Daughter assisted pt mod assist        Hand Dominance   Dominant Hand: Right     Extremity/Trunk Assessment   Upper Extremity Assessment Upper Extremity Assessment: Defer to OT evaluation    Lower Extremity Assessment Lower Extremity Assessment: Generalized weakness    Cervical / Trunk Assessment Cervical / Trunk Assessment: Normal  Communication   Communication: No difficulties  Cognition Arousal/Alertness: Awake/alert Behavior During Therapy: WFL for tasks assessed/performed Overall Cognitive Status: Within Functional Limits for tasks assessed                                        General Comments General comments (skin integrity, edema, etc.): Abdominal wound with binder and drain    Exercises     Assessment/Plan    PT Assessment Patient needs continued PT services  PT Problem List Decreased activity tolerance;Decreased balance;Decreased mobility;Decreased knowledge of use of DME;Decreased safety awareness;Decreased knowledge of precautions;Obesity;Decreased skin integrity;Pain       PT Treatment Interventions DME instruction;Gait training;Functional mobility training;Therapeutic activities;Therapeutic exercise;Balance training;Patient/family education    PT Goals (Current goals can be found in the Care Plan section)  Acute Rehab PT Goals Patient Stated Goal: to go home PT Goal Formulation: With patient Time For Goal Achievement: 08/25/17 Potential to Achieve Goals: Good    Frequency Min 3X/week   Barriers to discharge        Co-evaluation               AM-PAC PT "6 Clicks" Daily Activity  Outcome Measure Difficulty turning over in bed (including adjusting bedclothes, sheets and blankets)?: Unable Difficulty moving from lying on back to sitting on the side of the bed? : A Lot Difficulty sitting down on and standing up from a chair with arms (e.g., wheelchair, bedside commode, etc,.)?: A Lot Help needed moving to and from a bed to chair (including a wheelchair)?: A Lot Help needed walking in hospital room?: A  Little Help needed climbing 3-5 steps with a railing? : Total 6 Click Score: 11    End of Session Equipment Utilized During Treatment: Gait belt Activity Tolerance: Patient limited by fatigue Patient left: in bed;with call bell/phone within reach;with family/visitor present Nurse Communication: Mobility status PT Visit Diagnosis: Muscle weakness (generalized) (M62.81);Unsteadiness on feet (R26.81)    Time: 6226-3335 PT Time Calculation (min) (ACUTE ONLY): 31 min   Charges:   PT Evaluation $PT Eval Moderate Complexity: 1 Mod     PT G Codes:        Jacqueline Goodman,PT Acute Rehabilitation [phone] (pager)   Denice Paradise 08/11/2017, 2:56 PM

## 2017-08-11 NOTE — Progress Notes (Signed)
OT Cancellation Note  Patient Details Name: Jacqueline Goodman MRN: [id number] DOB: 1951-02-11   Cancelled Treatment:    Reason Eval/Treat Not Completed: Patient at procedure or test/ unavailable. Pt at CT off floor, OT will continue to follow for eval as schedule allows.   [place] 08/11/2017, 2:40 PM  Hulda Humphrey OTR/L [phone]

## 2017-08-11 NOTE — Progress Notes (Signed)
OT Cancellation Note  Patient Details Name: REDINA ZELLER MRN: [id number] DOB: 06-04-1951   Cancelled Treatment:    Reason Eval/Treat Not Completed: Patient at procedure or test/ unavailable. Plastic Surgeon in room doing dressing change, OT dropped off partial AE kit (long handle sponge, long handle shoe horn, and sock donner) in room for LB dressing/bathing  Jaci Carrel 08/11/2017, 4:39 PM  Hulda Humphrey OTR/L [phone]

## 2017-08-11 NOTE — Progress Notes (Signed)
Family Medicine Teaching Service Daily Progress Note Intern Pager: [phone]  Patient name: Jacqueline Goodman Medical record number: [id number] Date of birth: 08-Jan-1951 Age: 66 y.o. Gender: female  Primary Care Provider: Lind Covert, MD Consultants: Wound Care Code Status: Full  Pt Overview and Major Events to Date:  Jacqueline Goodman is a 66 y.o. female presenting with hypoglycemia and generalized weakness found to have pneumonia. PMH is significant for T2DM, HTN, HLD, s/p tummy tuck 07/22/2017.  Assessment and Plan:  Poor wound healing post-operative tummy tuck with likely infection: on 11/26. Patient previously on duricef 500mg  BID 11/27-12/2. After dressing change yesterday, wound does not appear to be healing well. J tube in pace on L side of incision with recent cloudy drainage. -Blood cx pending -Wound consult -Consult plastic surgery -CT abdomen and pelvis to assess for drainable abscess -Percocet 5-325mg  q4 prn pain -Started vanc (12/15-), and Zosyn (12/15-)  Community Acquired Pneumonia: CXR suggestive of pneumonia in the setting of leukocytosis with increased neutrophils.  Patient with history of cough and increased work of breathing the past few days.  Given azithromycin and CTX x1 in the ED.  -Azithromycin (12/15) and CTX (12/15), switched to Vanc (12/15-) and Zosyn (12/15-) -Monitor WBC on CBC -Monitor O2 sats and give supplemental oxygen if sats below 92%  Hypoglycemia, resolved: Hypoglycemia in the setting of use of sulfonylureas with decreased appetite and acute infection.  -Continue to monitor CBGs with meals and at bedtime -Hold home glimepiride -Urine culture negative and Blood culture to rule out other sources of infection  T2DM: Well controlled with last hemoglobin A1c of 6.5 in October 2018. -We will hold home glimepiride -SSI and CBGs at meals and bedtimes  Insomnia: Trouble sleeping, especially since her surgery. Patient taking benadryl 25mg  every  night for sleep. -Will hold benadryl -Consider trazodone for sleep if needed  HTN: Normotensive overnight. Last BP 101/54. -Continue home losartan 25mg  -Monitor  Nausea: Improved.  -Compazine for nausea  FEN/GI: carb modified diet Prophylaxis: Lovenox, SCDs  Disposition: pending medical stability  Subjective:  Patient says she is feeling much better than when she came in. She is still very tired and weak. She says her nausea is well controlled with the compazine.   Objective: Temp:  [98.2 F (36.8 C)-99.5 F (37.5 C)] 98.2 F (36.8 C) (12/16 0424) Pulse Rate:  [72-86] 72 (12/16 0424) Resp:  [18] 18 (12/16 0424) BP: (104-160)/(49-63) 104/57 (12/16 0424) SpO2:  [95 %-98 %] 96 % (12/16 0424) Physical Exam: General: NAD, laying in bed Cardiovascular: RRR, no mrg Respiratory: CTABL (difficult due to body habitus), no wheezes, or crackles noted Abdomen: abdominal binder on midsection with J tube draining  Extremities: moves all 4 extremities equally  Laboratory: Recent Labs  Lab 08/10/17 0118 08/11/17 0637  WBC 14.5* 13.8*  HGB 10.4* 9.0*  HCT 30.9* 27.4*  PLT 524* 503*   Recent Labs  Lab 08/10/17 0118 08/11/17 0637  NA 128* 129*  K 4.3 4.2  CL 98* 99*  CO2 20* 22  BUN 24* 27*  CREATININE 0.74 0.92  CALCIUM 8.3* 7.8*  PROT 6.8  --   BILITOT 0.4  --   ALKPHOS 161*  --   ALT 67*  --   AST 80*  --   GLUCOSE 75 107*    Imaging/Diagnostic Tests: Dg Chest 2 View  Result Date: 08/10/2017 CLINICAL DATA:  Acute onset of hypoglycemia.  Lack of appetite. EXAM: CHEST  2 VIEW COMPARISON:  None. FINDINGS: The lungs  are well-aerated. Mild basilar opacity is suggested on the lateral view. Would correlate for evidence of pneumonia. There is no evidence of pleural effusion or pneumothorax. The heart is borderline normal in size. No acute osseous abnormalities are seen. IMPRESSION: Mild basilar airspace opacity suggested on the lateral view. Would correlate clinically  for evidence of pneumonia. Electronically Signed   By: Garald Balding M.D.   On: 08/10/2017 03:31    Amarea Macdowell, [name], DO 08/11/2017, 8:46 AM PGY-1, [place] Intern pager: [phone], text pages welcome

## 2017-08-12 LAB — GLUCOSE, CAPILLARY
Glucose-Capillary: 108 mg/dL — ABNORMAL HIGH (ref 65–99)
Glucose-Capillary: 170 mg/dL — ABNORMAL HIGH (ref 65–99)
Glucose-Capillary: 147 mg/dL — ABNORMAL HIGH (ref 65–99)
Glucose-Capillary: 153 mg/dL — ABNORMAL HIGH (ref 65–99)

## 2017-08-12 LAB — CBC
HCT: 26.3 % — ABNORMAL LOW (ref 36.0–46.0)
Hemoglobin: 8.8 g/dL — ABNORMAL LOW (ref 12.0–15.0)
MCH: 28 pg (ref 26.0–34.0)
MCHC: 33.5 g/dL (ref 30.0–36.0)
MCV: 83.8 fL (ref 78.0–100.0)
Platelets: 463 10*3/uL — ABNORMAL HIGH (ref 150–400)
RBC: 3.14 MIL/uL — ABNORMAL LOW (ref 3.87–5.11)
RDW: 12.9 % (ref 11.5–15.5)
WBC: 13.6 10*3/uL — ABNORMAL HIGH (ref 4.0–10.5)

## 2017-08-12 LAB — BASIC METABOLIC PANEL
Anion gap: 9 (ref 5–15)
BUN: 21 mg/dL — ABNORMAL HIGH (ref 6–20)
CO2: 21 mmol/L — ABNORMAL LOW (ref 22–32)
Calcium: 8 mg/dL — ABNORMAL LOW (ref 8.9–10.3)
Chloride: 99 mmol/L — ABNORMAL LOW (ref 101–111)
Creatinine, Ser: 0.81 mg/dL (ref 0.44–1.00)
GFR calc Af Amer: >60 mL/min (ref >60)
GFR calc non Af Amer: >60 mL/min (ref >60)
Glucose, Bld: 114 mg/dL — ABNORMAL HIGH (ref 65–99)
Potassium: 4.4 mmol/L (ref 3.5–5.1)
Sodium: 129 mmol/L — ABNORMAL LOW (ref 135–145)

## 2017-08-12 MED ORDER — LOSARTAN POTASSIUM 50 MG PO TABS
50.0000 mg | ORAL_TABLET | Freq: Every day | ORAL | Status: DC
  Administered 2017-08-13 – 2017-08-21 (×9): 50 mg via ORAL
  Filled 2017-08-12 (×9): qty 1

## 2017-08-12 MED ORDER — HEPARIN SODIUM (PORCINE) 5000 UNIT/ML IJ SOLN
5000.0000 [IU] | Freq: Three times a day (TID) | INTRAMUSCULAR | Status: DC
  Administered 2017-08-13 – 2017-08-14 (×4): 5000 [IU] via SUBCUTANEOUS
  Filled 2017-08-12 (×5): qty 1

## 2017-08-12 MED ORDER — HEPARIN SODIUM (PORCINE) 5000 UNIT/ML IJ SOLN: 5000.0000 [IU] | Freq: Three times a day (TID) | INTRAMUSCULAR | Status: DC

## 2017-08-12 NOTE — Progress Notes (Signed)
PT Cancellation Note  Patient Details Name: Jacqueline Goodman MRN: [id number] DOB: Nov 26, 1950   Cancelled Treatment:    Reason Eval/Treat Not Completed: Other (comment)(AM, pt just worked with OT; pm too fatigued. )   Denice Paradise 08/12/2017, 3:53 PM [facility] Acute Rehabilitation [phone] (pager)

## 2017-08-12 NOTE — Consult Note (Signed)
[place] nurse consulted for wound care, however after review of the chart it appears plastic surgery is seeing this patient for her surgical wound and has written orders for wound care.  [place] nurse will not consult for this reason Levelle Edelen Liane Comber MSN, RN, [employer], CNS, [employer]

## 2017-08-12 NOTE — Evaluation (Signed)
Occupational Therapy Evaluation Patient Details Name: Jacqueline Goodman MRN: [id number] DOB: 1951-06-17 Today's Date: 08/12/2017    History of Present Illness Jacqueline Goodman is a 66 y.o. female presenting with hypoglycemia and generalized weakness found to have pneumonia. Recent tummy tuck incision with drain infected and dehiscence.  PMH is significant for T2DM, HTN, HLD, s/p tummy tuck 07/22/2017   Clinical Impression   This 66 yo female admitted with above presents to acute OT with generalized deconditioning, decreased balance, and abdominal wound thus affecting her safety and independence with basic ADLs. (dtr has been A'ing). She will benefit from acute OT with follow up [place] to work on increasing independence with basic ADLs.     Follow Up Recommendations  Home health OT;Supervision/Assistance - 24 hour    Equipment Recommendations  None recommended by OT       Precautions / Restrictions Precautions Precautions: Fall Precaution Comments: does not want to use RW due to fall x2 with RW; abdominal wound with compression garment on  Restrictions Weight Bearing Restrictions: No      Mobility Bed Mobility               General bed mobility comments: Pt up in recliner upon my arrival  Transfers Overall transfer level: Needs assistance Equipment used: 1 person hand held assist Transfers: Sit to/from Stand Sit to Stand: Min guard         General transfer comment: Ambulation in hallway with +1 HHA and pushing IV pole on other side    Balance Overall balance assessment: Needs assistance Sitting-balance support: No upper extremity supported;Feet supported Sitting balance-Leahy Scale: Fair     Standing balance support: Single extremity supported;During functional activity   Standing balance comment: can stand statically and move dynamically but needs at least 1 UE supported.                            ADL either performed or assessed with clinical  judgement   ADL Overall ADL's : Needs assistance/impaired Eating/Feeding: Independent;Sitting   Grooming: Set up;Sitting   Upper Body Bathing: Set up;Sitting   Lower Body Bathing: Maximal assistance Lower Body Bathing Details (indicate cue type and reason): min guard A sit<>stand Upper Body Dressing : Set up;Supervision/safety;Sitting     Lower Body Dressing Details (indicate cue type and reason): total A without AE; with AE min guard A with VCs Toilet Transfer: Minimal assistance;Ambulation Toilet Transfer Details (indicate cue type and reason): HHA ( pt does not want to use a RW due she has fallen x2 with one) Toileting- Clothing Manipulation and Hygiene: Maximal assistance Toileting - Clothing Manipulation Details (indicate cue type and reason): min guard A             Vision Patient Visual Report: No change from baseline              Pertinent Vitals/Pain Pain Assessment: No/denies pain Pain Score: 0-No pain     Hand Dominance Right   Extremity/Trunk Assessment Upper Extremity Assessment Upper Extremity Assessment: Overall WFL for tasks assessed           Communication Communication Communication: No difficulties   Cognition Arousal/Alertness: Awake/alert Behavior During Therapy: WFL for tasks assessed/performed Overall Cognitive Status: Within Functional Limits for tasks assessed  Home Living Family/patient expects to be discharged to:: Private residence Living Arrangements: Spouse/significant other Available Help at Discharge: Family;Available 24 hours/day(husband, 36 yo dtr and 6yo) Type of Home: House Home Access: Stairs to enter   Entrance Stairs-Rails: Right;Left;Can reach both Home Layout: One level     Bathroom Shower/Tub: Tub/shower unit(garden tub; normally takes tub baths but since tummy tuck surgery she has only been doing sponge baths)   Bathroom Toilet: Handicapped  height     Home Equipment: Environmental consultant - 2 wheels;Cane - single point;Shower seat          Prior Functioning/Environment Level of Independence: Needs assistance  Gait / Transfers Assistance Needed: husband was pushing pt in office chair because she was dizzy and fell 2 x with walker ADL's / Homemaking Assistance Needed: Daughter does pt sponge bath and helps her with socks (she has only been wearing socks and night gown since tummy tuck sx            OT Problem List: Decreased strength;Impaired balance (sitting and/or standing);Obesity;Decreased knowledge of use of DME or AE      OT Treatment/Interventions: Self-care/ADL training;Balance training;DME and/or AE instruction;Patient/family education    OT Goals(Current goals can be found in the care plan section) Acute Rehab OT Goals Patient Stated Goal: to go home OT Goal Formulation: With patient Time For Goal Achievement: 08/26/17 Potential to Achieve Goals: Good  OT Frequency: Min 2X/week              AM-PAC PT "6 Clicks" Daily Activity     Outcome Measure Help from another person eating meals?: None Help from another person taking care of personal grooming?: A Little Help from another person toileting, which includes using toliet, bedpan, or urinal?: A Lot Help from another person bathing (including washing, rinsing, drying)?: A Lot Help from another person to put on and taking off regular upper body clothing?: A Little Help from another person to put on and taking off regular lower body clothing?: Total 6 Click Score: 15   End of Session Nurse Communication: (pt with N/V (RN to give her IV meds))  Activity Tolerance: Patient tolerated treatment well Patient left: in chair;with call bell/phone within reach;with family/visitor present  OT Visit Diagnosis: Unsteadiness on feet (R26.81);Muscle weakness (generalized) (M62.81)                Time: 3903-0092 OT Time Calculation (min): 47 min Charges:  OT General  Charges $OT Visit: 1 Visit OT Evaluation $OT Eval Moderate Complexity: 1 Mod OT Treatments $Self Care/Home Management : 23-37 mins [place], OTR/L [phone] 08/12/2017

## 2017-08-12 NOTE — Progress Notes (Signed)
Family Medicine Teaching Service Daily Progress Note Intern Pager: [phone]  Patient name: Jacqueline Goodman Medical record number: [id number] Date of birth: Jan 24, 1951 Age: 66 y.o. Gender: female  Primary Care Provider: Lind Covert, MD Consultants: Wound Care Code Status: Full  Pt Overview and Major Events to Date:  Jacqueline Goodman is a 66 y.o. female presenting with hypoglycemia and generalized weakness found to have pneumonia. PMH is significant for T2DM, HTN, HLD, s/p tummy tuck 07/22/2017.  Assessment and Plan:  Poor wound healing s/p tummy tuck: Stable. on 11/26. Patient previously on duricef 500mg  BID 11/27-12/2. Plastic surgeon changed dressing yesterday and would like to take her back to debridement.  -Blood cx with NGx1 -Plastic surgeon is following who performed operation, appreciate recommendations -CT abdomen and pelvis negative for drainable abscess -Percocet 5-325mg  q4 prn pain -Started vanc (12/15-), and Zosyn (12/15-), can deescalate after surgery  CAP: Stable. CXR suggestive of pneumonia in the setting of leukocytosis with increased neutrophils. Given azithromycin and CTX x1 in the ED.  -Azithromycin (12/15) and CTX (12/15), switched to Vanc (12/15-) and Zosyn (12/15-) -Monitor WBC on CBC -Monitor O2 sats and give supplemental oxygen if sats below 92%  T2DM: Well controlled with last hemoglobin A1c of 6.5 in October 2018. -We will hold home glimepiride -SSI and CBGs at meals and bedtimes  Insomnia: Stable.  -Will hold home benadryl and suggest discontinuing this prior to discharge -Consider trazodone for sleep if needed  HTN: Normotensive overnight. Last BP 113/56. -Will decrease home losartan 75mg  to 50mg  -Monitor  Hypoalbuminemia: Albumin 1.8 on admission.  -Monitor. -Could be added to decreased wound healing. -Nutrition consult  Hyponatremia: NA 129 today.  -Will monitor on BMP  Nausea: Stable.  -Compazine for nausea  FEN/GI: carb  modified diet Prophylaxis: Lovenox, SCDs  Disposition: pending surgical debridement by plastic surgery  Subjective:  Patient sitting up today and stating she feels better. She just had an episode of vomiting and nausea but the compazine is helping.    Objective: Temp:  [98 F (36.7 C)-98.5 F (36.9 C)] 98 F (36.7 C) (12/17 0858) Pulse Rate:  [73-94] 75 (12/17 0858) Resp:  [18] 18 (12/17 0858) BP: (112-127)/(39-65) 113/56 (12/17 0858) SpO2:  [98 %-100 %] 100 % (12/17 0858) Weight:  [226 lb (102.5 kg)] 226 lb (102.5 kg) (12/16 2010) Physical Exam: General: NAD, sitting up in her chair Cardiovascular: RRR, no mrg Respiratory: CTABL with mild crackles noted in L lower lung base, no wheezing Abdomen: abdominal binder on midsection Extremities: moves all 4 extremities equally  Laboratory: Recent Labs  Lab 08/10/17 0118 08/11/17 0637 08/12/17 0613  WBC 14.5* 13.8* 13.6*  HGB 10.4* 9.0* 8.8*  HCT 30.9* 27.4* 26.3*  PLT 524* 503* 463*   Recent Labs  Lab 08/10/17 0118 08/11/17 0637 08/12/17 0613  NA 128* 129* 129*  K 4.3 4.2 4.4  CL 98* 99* 99*  CO2 20* 22 21*  BUN 24* 27* 21*  CREATININE 0.74 0.92 0.81  CALCIUM 8.3* 7.8* 8.0*  PROT 6.8 5.6*  --   BILITOT 0.4 0.4  --   ALKPHOS 161* 125  --   ALT 67* 52  --   AST 80* 44*  --   GLUCOSE 75 107* 114*    Imaging/Diagnostic Tests: Ct Abdomen Pelvis W Contrast  Result Date: 08/11/2017 CLINICAL DATA:  Abdominal pain and fever.  Suspected abscess. EXAM: CT ABDOMEN AND PELVIS WITH CONTRAST TECHNIQUE: Multidetector CT imaging of the abdomen and pelvis was performed using the  standard protocol following bolus administration of intravenous contrast. CONTRAST:  149mL ISOVUE-300 IOPAMIDOL (ISOVUE-300) INJECTION 61% COMPARISON:  04/19/2017 FINDINGS: Lower chest: Bilateral hypoventilatory changes. Hepatobiliary: No focal liver abnormality is seen. Status post cholecystectomy. No biliary dilatation. Pancreas: Unremarkable. No  pancreatic ductal dilatation or surrounding inflammatory changes. Spleen: Normal in size without focal abnormality. Adrenals/Urinary Tract: Adrenal glands are unremarkable. Kidneys are normal, without renal calculi, focal lesion, or hydronephrosis. Bladder is unremarkable. Stomach/Bowel: Stomach is within normal limits. Appendix appears normal. No evidence of bowel wall thickening, distention, or inflammatory changes. Vascular/Lymphatic: Mild aortic atherosclerosis. No enlarged abdominal or pelvic lymph nodes. Reproductive: Status post hysterectomy. No adnexal masses. Other: Subcutaneous fat stranding along the anterior abdominal wall spans the entire length of the abdomen and ascends to the lower thorax. Small amount of gas tracks along the external fascial planes. There is a small localized fluid collection which measures approximately 5 x 1.4 cm in the periumbilical anterior abdominal wall. Draining catheter is present within the anterior pelvic wall. Musculoskeletal: No acute findings. IMPRESSION: Fat stranding, and small amount of gas along the anterior abdominal wall, with 5 cm localized fluid collection in the periumbilical area. This may represent fasciitis/cellulitis. It is difficult to determine whether the small amount of gas is due to gas producing organism or postprocedural. Favor postprocedural etiology. No evidence of acute abnormalities intra-abdominally. Electronically Signed   By: Fidela Salisbury M.D.   On: 08/11/2017 15:50    Awanda Wilcock, [name], DO 08/12/2017, 9:24 AM PGY-1, [place] Intern pager: [phone], text pages welcome

## 2017-08-13 ENCOUNTER — Encounter: Payer: Self-pay | Admitting: Plastic Surgery

## 2017-08-13 LAB — BASIC METABOLIC PANEL
Anion gap: 6 (ref 5–15)
BUN: 18 mg/dL (ref 6–20)
CO2: 22 mmol/L (ref 22–32)
Calcium: 7.7 mg/dL — ABNORMAL LOW (ref 8.9–10.3)
Chloride: 100 mmol/L — ABNORMAL LOW (ref 101–111)
Creatinine, Ser: 0.72 mg/dL (ref 0.44–1.00)
GFR calc Af Amer: >60 mL/min (ref >60)
GFR calc non Af Amer: >60 mL/min (ref >60)
Glucose, Bld: 159 mg/dL — ABNORMAL HIGH (ref 65–99)
Potassium: 4.6 mmol/L (ref 3.5–5.1)
Sodium: 128 mmol/L — ABNORMAL LOW (ref 135–145)

## 2017-08-13 LAB — CBC
HCT: 24.8 % — ABNORMAL LOW (ref 36.0–46.0)
Hemoglobin: 8.2 g/dL — ABNORMAL LOW (ref 12.0–15.0)
MCH: 27.4 pg (ref 26.0–34.0)
MCHC: 33.1 g/dL (ref 30.0–36.0)
MCV: 82.9 fL (ref 78.0–100.0)
Platelets: 453 10*3/uL — ABNORMAL HIGH (ref 150–400)
RBC: 2.99 MIL/uL — ABNORMAL LOW (ref 3.87–5.11)
RDW: 12.8 % (ref 11.5–15.5)
WBC: 9.3 10*3/uL (ref 4.0–10.5)

## 2017-08-13 LAB — GLUCOSE, CAPILLARY
Glucose-Capillary: 156 mg/dL — ABNORMAL HIGH (ref 65–99)
Glucose-Capillary: 218 mg/dL — ABNORMAL HIGH (ref 65–99)
Glucose-Capillary: 167 mg/dL — ABNORMAL HIGH (ref 65–99)
Glucose-Capillary: 198 mg/dL — ABNORMAL HIGH (ref 65–99)

## 2017-08-13 MED ORDER — ADULT MULTIVITAMIN W/MINERALS CH
1.0000 | ORAL_TABLET | Freq: Every day | ORAL | Status: DC
  Administered 2017-08-13 – 2017-08-21 (×9): 1 via ORAL
  Filled 2017-08-13 (×9): qty 1

## 2017-08-13 MED ORDER — PRO-STAT SUGAR FREE PO LIQD
30.0000 mL | Freq: Two times a day (BID) | ORAL | Status: DC
  Administered 2017-08-13 – 2017-08-19 (×10): 30 mL via ORAL
  Filled 2017-08-13 (×10): qty 30

## 2017-08-13 MED ORDER — BACITRACIN ZINC 500 UNIT/GM EX OINT
TOPICAL_OINTMENT | Freq: Three times a day (TID) | CUTANEOUS | Status: DC
  Administered 2017-08-13: 1 via TOPICAL
  Administered 2017-08-14 – 2017-08-15 (×5): via TOPICAL
  Filled 2017-08-13: qty 28.35

## 2017-08-13 MED ORDER — GLUCERNA SHAKE PO LIQD
237.0000 mL | Freq: Three times a day (TID) | ORAL | Status: DC
  Administered 2017-08-13 – 2017-08-21 (×20): 237 mL via ORAL
  Filled 2017-08-13 (×32): qty 237

## 2017-08-13 MED ORDER — SODIUM CHLORIDE 0.9 % IV SOLN
INTRAVENOUS | Status: DC
  Administered 2017-08-13: 100 mL/h via INTRAVENOUS

## 2017-08-13 NOTE — Evaluation (Signed)
Physical Therapy Evaluation Patient Details Name: Jacqueline Goodman MRN: [id number] DOB: 08-01-1951 Today's Date: 08/13/2017   History of Present Illness  Jacqueline Goodman is a 66 y.o. female presenting with hypoglycemia and generalized weakness found to have pneumonia. Recent tummy tuck incision with drain infected and dehiscence.  PMH is significant for T2DM, HTN, HLD, s/p tummy tuck 07/22/2017  Clinical Impression  Patient seen for mobility progression, tolerated well. Increased ambulation, less physical assist. Encouraged continued mobility. Current POC remains appropriate.    Follow Up Recommendations Home health PT;Supervision/Assistance - 24 hour    Equipment Recommendations  Other (comment)(TBA)    Recommendations for Other Services       Precautions / Restrictions Precautions Precautions: Fall Restrictions Weight Bearing Restrictions: No      Mobility  Bed Mobility               General bed mobility comments: received in chair  Transfers Overall transfer level: Needs assistance Equipment used: None Transfers: Sit to/from Stand Sit to Stand: Supervision         General transfer comment: increased time to perform, no physical assist requried  Ambulation/Gait Ambulation/Gait assistance: Supervision Ambulation Distance (Feet): 120 Feet Assistive device: (pushing IV pole) Gait Pattern/deviations: Step-through pattern;Decreased stride length Gait velocity: decreased Gait velocity interpretation: Below normal speed for age/gender General Gait Details: steady with ambulation and sinlge UE support  Stairs            Wheelchair Mobility    Modified Rankin (Stroke Patients Only)       Balance Overall balance assessment: Needs assistance Sitting-balance support: No upper extremity supported;Feet supported Sitting balance-Leahy Scale: Fair     Standing balance support: Single extremity supported;During functional activity Standing balance-Leahy  Scale: Fair Standing balance comment: able to stand static without UE support                             Pertinent Vitals/Pain Pain Assessment: No/denies pain Pain Location: abdomen Pain Descriptors / Indicators: Aching;Guarding Pain Intervention(s): Monitored during session    Home Living                        Prior Function                 Hand Dominance        Extremity/Trunk Assessment                Communication      Cognition Arousal/Alertness: Awake/alert Behavior During Therapy: WFL for tasks assessed/performed Overall Cognitive Status: Within Functional Limits for tasks assessed                                        General Comments      Exercises     Assessment/Plan    PT Assessment    PT Problem List         PT Treatment Interventions      PT Goals (Current goals can be found in the Care Plan section)  Acute Rehab PT Goals Patient Stated Goal: to go home PT Goal Formulation: With patient Time For Goal Achievement: 08/25/17 Potential to Achieve Goals: Good    Frequency Min 3X/week   Barriers to discharge        Co-evaluation  AM-PAC PT "6 Clicks" Daily Activity  Outcome Measure Difficulty turning over in bed (including adjusting bedclothes, sheets and blankets)?: A Lot Difficulty moving from lying on back to sitting on the side of the bed? : A Lot Difficulty sitting down on and standing up from a chair with arms (e.g., wheelchair, bedside commode, etc,.)?: A Lot Help needed moving to and from a bed to chair (including a wheelchair)?: A Little Help needed walking in hospital room?: A Little Help needed climbing 3-5 steps with a railing? : A Lot 6 Click Score: 14    End of Session Equipment Utilized During Treatment: (did not use gait belt dur to abdominal incision) Activity Tolerance: Patient tolerated treatment well Patient left: in chair;with call bell/phone  within reach Nurse Communication: Mobility status PT Visit Diagnosis: Muscle weakness (generalized) (M62.81);Unsteadiness on feet (R26.81)    Time: 7290-2111 PT Time Calculation (min) (ACUTE ONLY): 19 min   Charges:     PT Treatments $Gait Training: 8-22 mins   PT G Codes:        Alben Deeds, PT DPT  Board Certified Neurologic Specialist [place]annette 08/13/2017, 11:21 AM

## 2017-08-13 NOTE — Progress Notes (Signed)
PCP Visit I saw Jacqueline Goodman yesterday  Agree with stopping glimepiride and watching blood sugar I will check an A1c in office at follow up.  I think her low blood sugar was likely due to infection and reduced intake  Appreciate the great care of our inpt service  [place]

## 2017-08-13 NOTE — Progress Notes (Signed)
Initial Nutrition Assessment  DOCUMENTATION CODES:   Severe malnutrition in context of acute illness/injury  INTERVENTION:   Glucerna Shake po TID, each supplement provides 220 kcal and 10 grams of protein  Encouraged smaller, more frequent meals. Snacks ordered between meals  Pro-Stat BID  MVI  Reinforced importance of adequate nutrition with regards to wound healing and good glucose management   NUTRITION DIAGNOSIS:   Severe Malnutrition related to acute illness as evidenced by percent weight loss, moderate muscle depletion, moderate fat depletion.  GOAL:   Patient will meet greater than or equal to 90% of their needs  MONITOR:   PO intake, Supplement acceptance, Skin, Labs, Weight trends  REASON FOR ASSESSMENT:   Malnutrition Screening Tool    ASSESSMENT:   66 yo female admitted with hypoglycemia (in setting of sulfonylureas with decreased appetite and acute infection) and generalized weakness found to have pneumonia. Pt also with recent tummy tuck (07/22/17) with poor wound healing which has dehisced with purulent draiange Pt with hx of DM, HTN, HLD   Plastic surgery has been consulted for debridement or revision surgery  Recorded po intake 75% at breakfast this AM; 50% of meals yesterday. Pt reports she has no appetite, early satiety. Pt has been eating 1-2 meals per day with no supplements. Pt reports she usually eats eggs, bacon/sausage, coffee for breakfast and may eat a meal mid afternoon that counts as her lunch/dinner.   Current weight 225 pounds. Pt reports she used to weigh 350 pounds, then worked really hard and lost down to 180 pounds. She then gained back to 250 and has finally gotten back down to 225 pounds. Pt expressed concern regarding not wanting to gain weight; reinforced importance of focusing on good nutrition to promote wound healing and maintain good glucose control as primary focus at this time   Labs: sodium 128, CBGs 147-208 in past 24  hours Lab Results  Component Value Date   HGBA1C 6.5 06/05/2017   Meds: NS at 100 ml/hr  NUTRITION - FOCUSED PHYSICAL EXAM:    Most Recent Value  Orbital Region  Moderate depletion  Upper Arm Region  No depletion  Thoracic and Lumbar Region  No depletion  Buccal Region  Mild depletion  Temple Region  Moderate depletion  Clavicle Bone Region  Mild depletion  Clavicle and Acromion Bone Region  Mild depletion  Scapular Bone Region  Mild depletion  Dorsal Hand  No depletion  Patellar Region  Mild depletion  Anterior Thigh Region  Moderate depletion  Posterior Calf Region  Mild depletion  Edema (RD Assessment)  None  Hair  Reviewed  Eyes  Reviewed  Mouth  Reviewed  Skin  Reviewed  Nails  Reviewed       Diet Order:  Diet Carb Modified Fluid consistency: Thin; Room service appropriate? Yes  EDUCATION NEEDS:   Education needs have been addressed  Skin:  Skin Assessment: Skin Integrity Issues: Skin Integrity Issues:: Other (Comment) Other: recent tummy tuck that has dehisced with purulent drainage  Last BM:  12/17  Height:   Ht Readings from Last 1 Encounters:  08/10/17 5\' 9"  (1.753 m)    Weight:   Wt Readings from Last 1 Encounters:  08/12/17 225 lb 8.5 oz (102.3 kg)    Ideal Body Weight:     BMI:  Body mass index is 33.31 kg/m.  Estimated Nutritional Needs:   Kcal:  2000-2200  Protein:  125-140 g  Fluid:  >/= 2 L  Kerman Passey MS, RD, LDN,  CNSC [phone] Pager  [phone] Weekend/On-Call Pager

## 2017-08-13 NOTE — Progress Notes (Signed)
Family Medicine Teaching Service Daily Progress Note Intern Pager: [phone]  Patient name: Jacqueline Goodman Medical record number: [id number] Date of birth: 12-01-1950 Age: 66 y.o. Gender: female  Primary Care Provider: Lind Covert, MD Consultants: Wound Care Code Status: Full  Pt Overview and Major Events to Date:  Jacqueline Goodman is a 66 y.o. female presenting with hypoglycemia and generalized weakness found to have pneumonia. PMH is significant for T2DM, HTN, HLD, s/p tummy tuck 07/22/2017.  Assessment and Plan:  Poor wound healing s/p tummy tuck: Stable. on 11/26. Patient previously on duricef 500mg  BID 11/27-12/2. Plastic surgeon to take her back to OR.  -Blood cx with NGx2 -Plastic surgeon is following who performed operation, appreciate recommendations -Percocet 5-325mg  q4 prn pain -Started vanc (12/15-), and Zosyn (12/15-), can deescalate after surgery -cleared for surgery after Na level improves on 127mL/hr   CAP: Stable. CXR suggestive of pneumonia in the setting of leukocytosis with increased neutrophils. Given azithromycin and CTX x1 in the ED.  -Azithromycin (12/15) and CTX (12/15), switched to Vanc (12/15-) and Zosyn (12/15-) -Monitor WBC on CBC -Monitor O2 sats and give supplemental oxygen if sats below 92%  T2DM: Well controlled with last hemoglobin A1c of 6.5 in October 2018. -We will hold home glimepiride -SSI and CBGs at meals and bedtimes  Insomnia: Stable.  -Will hold home benadryl and suggest discontinuing this prior to discharge -Consider trazodone for sleep if needed  HTN: Normotensive overnight. Last BP 130/47. -Will decrease home losartan 75mg  to 50mg  -Monitor  Hypoalbuminemia: Albumin 1.8 on admission.  -Monitor. -Could be adding to decreased wound healing. -Nutrition consulted  Hyponatremia: NA 128 today.  -Will monitor on BMP - 148mL/hr of NS  Nausea: Stable.  -Compazine for nausea  FEN/GI: carb modified diet Prophylaxis:  Lovenox, SCDs  Disposition: pending surgical debridement by plastic surgery  Subjective:  Patient states she is feeling better today. She is wanting to get the operation so she can get better befor Christmas. No complaints or concerns.   Objective: Temp:  [98.1 F (36.7 C)-98.5 F (36.9 C)] 98.1 F (36.7 C) (12/18 0752) Pulse Rate:  [69-79] 79 (12/18 0752) Resp:  [18-20] 18 (12/18 0752) BP: (126-137)/(39-50) 130/47 (12/18 0752) SpO2:  [97 %-100 %] 97 % (12/18 0752) Weight:  [225 lb 8.5 oz (102.3 kg)] 225 lb 8.5 oz (102.3 kg) (12/17 2037) Physical Exam: General: NAD, sitting up in bed Cardiovascular: RRR, no mrg Respiratory: CTABL with mild crackles noted in L lower lung base, no wheezing Abdomen: abdominal binder on midsection with draining  Extremities: moves all 4 extremities equally  Laboratory: Recent Labs  Lab 08/11/17 0637 08/12/17 0613 08/13/17 0739  WBC 13.8* 13.6* 9.3  HGB 9.0* 8.8* 8.2*  HCT 27.4* 26.3* 24.8*  PLT 503* 463* 453*   Recent Labs  Lab 08/10/17 0118 08/11/17 0637 08/12/17 0613 08/13/17 0739  NA 128* 129* 129* 128*  K 4.3 4.2 4.4 4.6  CL 98* 99* 99* 100*  CO2 20* 22 21* 22  BUN 24* 27* 21* 18  CREATININE 0.74 0.92 0.81 0.72  CALCIUM 8.3* 7.8* 8.0* 7.7*  PROT 6.8 5.6*  --   --   BILITOT 0.4 0.4  --   --   ALKPHOS 161* 125  --   --   ALT 67* 52  --   --   AST 80* 44*  --   --   GLUCOSE 75 107* 114* 159*    Imaging/Diagnostic Tests: Ct Abdomen Pelvis W Contrast  Result  Date: 08/11/2017 CLINICAL DATA:  Abdominal pain and fever.  Suspected abscess. EXAM: CT ABDOMEN AND PELVIS WITH CONTRAST TECHNIQUE: Multidetector CT imaging of the abdomen and pelvis was performed using the standard protocol following bolus administration of intravenous contrast. CONTRAST:  178mL ISOVUE-300 IOPAMIDOL (ISOVUE-300) INJECTION 61% COMPARISON:  04/19/2017 FINDINGS: Lower chest: Bilateral hypoventilatory changes. Hepatobiliary: No focal liver abnormality is seen.  Status post cholecystectomy. No biliary dilatation. Pancreas: Unremarkable. No pancreatic ductal dilatation or surrounding inflammatory changes. Spleen: Normal in size without focal abnormality. Adrenals/Urinary Tract: Adrenal glands are unremarkable. Kidneys are normal, without renal calculi, focal lesion, or hydronephrosis. Bladder is unremarkable. Stomach/Bowel: Stomach is within normal limits. Appendix appears normal. No evidence of bowel wall thickening, distention, or inflammatory changes. Vascular/Lymphatic: Mild aortic atherosclerosis. No enlarged abdominal or pelvic lymph nodes. Reproductive: Status post hysterectomy. No adnexal masses. Other: Subcutaneous fat stranding along the anterior abdominal wall spans the entire length of the abdomen and ascends to the lower thorax. Small amount of gas tracks along the external fascial planes. There is a small localized fluid collection which measures approximately 5 x 1.4 cm in the periumbilical anterior abdominal wall. Draining catheter is present within the anterior pelvic wall. Musculoskeletal: No acute findings. IMPRESSION: Fat stranding, and small amount of gas along the anterior abdominal wall, with 5 cm localized fluid collection in the periumbilical area. This may represent fasciitis/cellulitis. It is difficult to determine whether the small amount of gas is due to gas producing organism or postprocedural. Favor postprocedural etiology. No evidence of acute abnormalities intra-abdominally. Electronically Signed   By: Fidela Salisbury M.D.   On: 08/11/2017 15:50    Stillman Buenger, [name], DO 08/13/2017, 1:28 PM PGY-1, [employer] Intern pager: [phone], text pages welcome

## 2017-08-13 NOTE — Progress Notes (Signed)
Jacqueline Goodman underwent a Panniculectomy/Abdominoplasty as an outpatient and has developed fatty necrosis of the lower tip of her abdominoplasty flap and around the umbilicus. This began and has been followed as an outpatient. She has not had any purulent drainage, fevers or abscess.. Jacqueline. Castillo developed an extremely low blood sugar (~ 25) at home with a decreased level of responsiveness so her family had the patient transported to [facility] ED via EMS and she was admitted through the ED on 08/10/2017. I was consulted on 08/11/2017 for wound management and came in to evaluate the patient's abdominal wound on that day and have followed her daily since then. PE: Vital signs stable.  Abdomen: Soft, non-tender. Fatty necrosis is present around the umbilicus and along the lower central abdominal skin at the incision line with dark, non-viable skin in some of these areas and then blistering of skin over deeper skin and tissue that is viable. Serous drainage with fatty fluid in the JP drain and with slight drainage from the wound. Minimal erythema. A few scattered areas of scabbing along some of the abdominoplasty incision with surrounding viable skin. 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 Due to the history of obesity and diabetes, these factors have likely contributed to poorer wound healing post-operatively. The patient was medically cleared for surgery and the surgery was performed to possibly help with her ambulation and overall health. These risks were discussed pre-operatively. Once the medical team feels that the patient's diabetes is under good control, the question of a possible pneumonia vs. atelectasis is clarified and they have cleared the patient again for surgery, we can proceed with the debridement of her wound. As the debridement is performed, we will judge in the OR whether we will proceed with normal saline moist to dry dressings or  perhaps place a VAC if the debridement is good enough. It may take more than one debridement to optimize the wound. I will discuss the possibility of a spinal anesthetic with the anesthesia team to minimize the general anesthesia. We will plan for debridement on Thursday to allow optimization of the patient for the OR. In the meantime, dressings consisting of normal saline moist to dry gauze dressings over the umbilicus and lower central skin where there this blistering of the skin (with packing of skin edges if needed) and Bacitracin ointment with either Vaseline or Xeroform gauze over the dry darker skin and then ABD pads with Hypafix tape need to be performed three times a day- this was reviewed and one of the dressing changes has been performed with one of the patient's nurses each day since Sunday and they placed the verbal order for the dressings.

## 2017-08-13 NOTE — Progress Notes (Signed)
Pt has a small amount of tan drainage spill onto the floor from dressing while in the bathroom this am. MD made aware and also oncoming RN.

## 2017-08-14 ENCOUNTER — Ambulatory Visit: Payer: Self-pay | Admitting: Plastic Surgery

## 2017-08-14 ENCOUNTER — Telehealth: Payer: Self-pay | Admitting: Family Medicine

## 2017-08-14 DIAGNOSIS — E43 Unspecified severe protein-calorie malnutrition: Secondary | ICD-10-CM

## 2017-08-14 LAB — CBC
HCT: 28 % — ABNORMAL LOW (ref 36.0–46.0)
Hemoglobin: 9 g/dL — ABNORMAL LOW (ref 12.0–15.0)
MCH: 27.1 pg (ref 26.0–34.0)
MCHC: 32.1 g/dL (ref 30.0–36.0)
MCV: 84.3 fL (ref 78.0–100.0)
Platelets: 558 10*3/uL — ABNORMAL HIGH (ref 150–400)
RBC: 3.32 MIL/uL — ABNORMAL LOW (ref 3.87–5.11)
RDW: 13 % (ref 11.5–15.5)
WBC: 11.9 10*3/uL — ABNORMAL HIGH (ref 4.0–10.5)

## 2017-08-14 LAB — GLUCOSE, CAPILLARY
Glucose-Capillary: 127 mg/dL — ABNORMAL HIGH (ref 65–99)
Glucose-Capillary: 206 mg/dL — ABNORMAL HIGH (ref 65–99)
Glucose-Capillary: 195 mg/dL — ABNORMAL HIGH (ref 65–99)
Glucose-Capillary: 194 mg/dL — ABNORMAL HIGH (ref 65–99)

## 2017-08-14 LAB — BASIC METABOLIC PANEL
Anion gap: 8 (ref 5–15)
BUN: 14 mg/dL (ref 6–20)
CO2: 26 mmol/L (ref 22–32)
Calcium: 8.3 mg/dL — ABNORMAL LOW (ref 8.9–10.3)
Chloride: 98 mmol/L — ABNORMAL LOW (ref 101–111)
Creatinine, Ser: 0.71 mg/dL (ref 0.44–1.00)
GFR calc Af Amer: >60 mL/min (ref >60)
GFR calc non Af Amer: >60 mL/min (ref >60)
Glucose, Bld: 144 mg/dL — ABNORMAL HIGH (ref 65–99)
Potassium: 4.4 mmol/L (ref 3.5–5.1)
Sodium: 132 mmol/L — ABNORMAL LOW (ref 135–145)

## 2017-08-14 LAB — VANCOMYCIN, TROUGH: Vancomycin Tr: 13 ug/mL — ABNORMAL LOW (ref 15–20)

## 2017-08-14 MED ORDER — VANCOMYCIN HCL IN DEXTROSE 1-5 GM/200ML-% IV SOLN
1000.0000 mg | Freq: Two times a day (BID) | INTRAVENOUS | Status: DC
  Administered 2017-08-14 – 2017-08-17 (×5): 1000 mg via INTRAVENOUS
  Filled 2017-08-14 (×6): qty 200

## 2017-08-14 MED ORDER — RAMELTEON 8 MG PO TABS
8.0000 mg | ORAL_TABLET | Freq: Every day | ORAL | Status: DC
  Administered 2017-08-14 – 2017-08-21 (×7): 8 mg via ORAL
  Filled 2017-08-14 (×8): qty 1

## 2017-08-14 NOTE — Progress Notes (Signed)
Pharmacy Antibiotic Note  Jacqueline Goodman is a 66 y.o. female admitted on 08/10/2017 with concern of wound infection and possible pneumonia (patient noted with recent tummy tuck and with abdominal wound).  Pharmacy has been consulted for vancomycin/zosyn dosing. Plans for I&D soon.  12/19: Vancomycin level 13 (~ 9 hours)  Plan: -Continue Zosyn 3.375gm IV q8h -Increase vancomycin to 1000mg  IV q12h -Will follow renal function, cultures and clinical progress   Height: 5\' 9"  (175.3 cm) Weight: 225 lb 9.6 oz (102.3 kg) IBW/kg (Calculated) : 66.2  Temp (24hrs), Avg:97.9 F (36.6 C), Min:97.6 F (36.4 C), Max:98.1 F (36.7 C)  Recent Labs  Lab 08/10/17 0118 08/10/17 0140 08/11/17 0637 08/12/17 0613 08/13/17 0739 08/14/17 0519 08/14/17 0843  WBC 14.5*  --  13.8* 13.6* 9.3 11.9*  --   CREATININE 0.74  --  0.92 0.81 0.72 0.71  --   LATICACIDVEN  --  1.15  --   --   --   --   --   VANCOTROUGH  --   --   --   --   --   --  13*    Estimated Creatinine Clearance: 88 mL/min (by C-G formula based on SCr of 0.71 mg/dL).    Allergies  Allergen Reactions  . Morphine And Related Other (See Comments)    "says doesn't work"    Antimicrobials this admission: 12/15 vanc>> 12/15 zosyn>>  Dose adjustments this admission: 12/19 increase vanc from 750 mg q 12 h to 1000 mg q 12 hr  Microbiology results: 12/15 urine ngf 12/15 blood x2 ngtd  Thank you for allowing [place] to participate in this patients care.  Jens Som, PharmD Clinical phone for 08/14/2017 from 7a-3:30p: [phone] If after 3:30p, please call main pharmacy at: [phone] 08/14/2017 10:52 AM

## 2017-08-14 NOTE — Progress Notes (Signed)
Family Medicine Teaching Service Daily Progress Note Intern Pager: [phone]  Patient name: Jacqueline Goodman Medical record number: [id number] Date of birth: 02/14/1951 Age: 66 y.o. Gender: female  Primary Care Provider: Lind Covert, MD Consultants: Wound Care Code Status: Full  Pt Overview and Major Events to Date:  KARENNA ROMANOFF is a 66 y.o. female presenting with hypoglycemia and generalized weakness found to have pneumonia. PMH is significant for T2DM, HTN, HLD, s/p tummy tuck 07/22/2017.  Assessment and Plan:  Poor wound healing s/p tummy tuck: Stable. on 11/26. Plastic surgeon to take her back to OR.  -Blood cx with NGx3 -Plastic surgeon is following who performed operation, appreciate recommendations -Percocet 5-325mg  q4 prn pain -Started vanc (12/15-), and Zosyn (12/15-), will deescalate after surgery -cleared for surgery  CAP: Improving. No crackles on exam today. CXR suggestive of pneumonia in the setting of leukocytosis with increased neutrophils. -Azithromycin (12/15) and CTX (12/15), switched to Vanc (12/15-) and Zosyn (12/15-) -Monitor WBC on CBC -Monitor O2 sats and give supplemental oxygen if sats below 92%  T2DM: Well controlled with last hemoglobin A1c of 6.5 in October 2018. -We will hold home glimepiride -SSI and CBGs at meals and bedtimes  Insomnia: Stable.  -Will hold home benadryl and suggest discontinuing this prior to discharge -Given ramelteon for sleep   HTN: Normotensive overnight. Last BP slightly elevated at 149/65. -Decrease home losartan 50mg  -Monitor  Hypoalbuminemia: Albumin 1.8 on admission.  -Could be adding to decreased wound healing. -Nutrition consulted  Hyponatremia: NA 132 today.  -Will monitor on BMP -stopped IVF  Nausea: Stable.  -Compazine for nausea  FEN/GI: carb modified diet Prophylaxis: Lovenox, SCDs  Disposition: pending surgical debridement by plastic surgery  Subjective:  Patient complaining of  not sleeping. Says she feels fine and is ready to have surgery she just really wants to know when it is. She has no other complaints.   Objective: Temp:  [97.6 F (36.4 C)-98.1 F (36.7 C)] 97.6 F (36.4 C) (12/19 0524) Pulse Rate:  [72-84] 74 (12/19 0524) Resp:  [18-20] 20 (12/18 2134) BP: (120-149)/(56-66) 149/65 (12/19 0524) SpO2:  [98 %-100 %] 100 % (12/19 0524) Weight:  [225 lb 9.6 oz (102.3 kg)] 225 lb 9.6 oz (102.3 kg) (12/18 2134) Physical Exam: General: NAD, sitting up in chair Cardiovascular: RRR, no mrg Respiratory: CTABL, no crackles, rhonchi, or wheezing Abdomen: abdominal binder on midsection Extremities: moves all 4 extremities equally  Laboratory: Recent Labs  Lab 08/12/17 0613 08/13/17 0739 08/14/17 0519  WBC 13.6* 9.3 11.9*  HGB 8.8* 8.2* 9.0*  HCT 26.3* 24.8* 28.0*  PLT 463* 453* 558*   Recent Labs  Lab 08/10/17 0118 08/11/17 0637 08/12/17 0613 08/13/17 0739 08/14/17 0519  NA 128* 129* 129* 128* 132*  K 4.3 4.2 4.4 4.6 4.4  CL 98* 99* 99* 100* 98*  CO2 20* 22 21* 22 26  BUN 24* 27* 21* 18 14  CREATININE 0.74 0.92 0.81 0.72 0.71  CALCIUM 8.3* 7.8* 8.0* 7.7* 8.3*  PROT 6.8 5.6*  --   --   --   BILITOT 0.4 0.4  --   --   --   ALKPHOS 161* 125  --   --   --   ALT 67* 52  --   --   --   AST 80* 44*  --   --   --   GLUCOSE 75 107* 114* 159* 144*    Imaging/Diagnostic Tests: No results found.  Adreonna Yontz, [name], DO 08/14/2017, 8:03  AM PGY-1, [place] Intern pager: [phone], text pages welcome

## 2017-08-14 NOTE — Care Management Important Message (Signed)
Important Message  Patient Details  Name: Jacqueline Goodman MRN: [id number] Date of Birth: 16-Apr-1951   Medicare Important Message Given:  Yes    Nathen May 08/14/2017, 9:16 AM

## 2017-08-14 NOTE — Telephone Encounter (Addendum)
Residents called to discussed patient with the plastic surgeon to inform her that patient is cleared for surgery. During round this morning, they informed me that Dr. Roxy Horseman will like to speak to the attending directly.  I called her office at [phone] and spoke with Chelsey. I was advised she is not available at the moment. Message left for her to call me back at her own convenience.  FROM MEDICAL STANDPOINT MS. Siebels IS READY FOR SURGERY.  We were waiting on the resolution of hypoglycemia, stable BP and improve sodium and she has met all requirements. She is stable from pneumonia standpoint.  Further surgical management and plan deferred to the plastic surgeon.

## 2017-08-14 NOTE — Care Management Note (Signed)
Case Management Note  Patient Details  Name: LONETA TAMPLIN MRN: [id number] Date of Birth: 03-28-1951  Subjective/Objective:                Patient from home admitted with large wound infection s/p pannis revision 11/26. Anticipate I&D 12/20, patient medically cleared for surgery per attending's notes. CM will continue to follow and assess for home needs.     Action/Plan:   Expected Discharge Date:  08/15/17               Expected Discharge Plan:  Home/Self Care  In-House Referral:     Discharge planning Services  CM Consult  Post Acute Care Choice:    Choice offered to:     DME Arranged:    DME Agency:     HH Arranged:    HH Agency:     Status of Service:  In process, will continue to follow  If discussed at Long Length of Stay Meetings, dates discussed:    Additional Comments:  Carles Collet, RN 08/14/2017, 1:58 PM

## 2017-08-14 NOTE — Telephone Encounter (Signed)
I received call back from the plastic surgeon. She will like to take patient in to the OR tomorrow. Her main concern is to keep her blood glucose under control. She prefer a goal of 150 or less. We can ensure this once she follow-up at the clinic with her PCP. We will adjust her DM meds as needed.

## 2017-08-15 ENCOUNTER — Encounter (HOSPITAL_COMMUNITY): Payer: Self-pay | Admitting: Anesthesiology

## 2017-08-15 ENCOUNTER — Ambulatory Visit: Payer: Self-pay | Admitting: Plastic Surgery

## 2017-08-15 ENCOUNTER — Inpatient Hospital Stay (HOSPITAL_COMMUNITY): Payer: Medicare Other | Admitting: Anesthesiology

## 2017-08-15 ENCOUNTER — Encounter (HOSPITAL_COMMUNITY)
Admission: EM | Disposition: A | Discharge status: Home / Self Care | Payer: Self-pay | Source: Home / Self Care | Attending: Family Medicine

## 2017-08-15 HISTORY — PX: INCISION AND DRAINAGE OF WOUND: SHX1803

## 2017-08-15 LAB — BASIC METABOLIC PANEL
Anion gap: 5 (ref 5–15)
BUN: 12 mg/dL (ref 6–20)
CO2: 26 mmol/L (ref 22–32)
Calcium: 8 mg/dL — ABNORMAL LOW (ref 8.9–10.3)
Chloride: 101 mmol/L (ref 101–111)
Creatinine, Ser: 0.67 mg/dL (ref 0.44–1.00)
GFR calc Af Amer: >60 mL/min (ref >60)
GFR calc non Af Amer: >60 mL/min (ref >60)
Glucose, Bld: 136 mg/dL — ABNORMAL HIGH (ref 65–99)
Potassium: 4.2 mmol/L (ref 3.5–5.1)
Sodium: 132 mmol/L — ABNORMAL LOW (ref 135–145)

## 2017-08-15 LAB — CULTURE, BLOOD (ROUTINE X 2)
Culture: NO GROWTH
Culture: NO GROWTH
Special Requests: ADEQUATE
Special Requests: ADEQUATE

## 2017-08-15 LAB — GLUCOSE, CAPILLARY
Glucose-Capillary: 109 mg/dL — ABNORMAL HIGH (ref 65–99)
Glucose-Capillary: 153 mg/dL — ABNORMAL HIGH (ref 65–99)
Glucose-Capillary: 143 mg/dL — ABNORMAL HIGH (ref 65–99)
Glucose-Capillary: 112 mg/dL — ABNORMAL HIGH (ref 65–99)
Glucose-Capillary: 126 mg/dL — ABNORMAL HIGH (ref 65–99)
Glucose-Capillary: 139 mg/dL — ABNORMAL HIGH (ref 65–99)

## 2017-08-15 LAB — CBC
HCT: 25.6 % — ABNORMAL LOW (ref 36.0–46.0)
Hemoglobin: 8.5 g/dL — ABNORMAL LOW (ref 12.0–15.0)
MCH: 28 pg (ref 26.0–34.0)
MCHC: 33.2 g/dL (ref 30.0–36.0)
MCV: 84.2 fL (ref 78.0–100.0)
Platelets: 460 10*3/uL — ABNORMAL HIGH (ref 150–400)
RBC: 3.04 MIL/uL — ABNORMAL LOW (ref 3.87–5.11)
RDW: 13.2 % (ref 11.5–15.5)
WBC: 10.5 10*3/uL (ref 4.0–10.5)

## 2017-08-15 LAB — SURGICAL PCR SCREEN
MRSA, PCR: NEGATIVE
Staphylococcus aureus: NEGATIVE

## 2017-08-15 SURGERY — IRRIGATION AND DEBRIDEMENT WOUND
Anesthesia: General | Site: Abdomen

## 2017-08-15 MED ORDER — BUPIVACAINE LIPOSOME 1.3 % IJ SUSP
INTRAMUSCULAR | Status: DC | PRN
  Administered 2017-08-15: 20 mL

## 2017-08-15 MED ORDER — LACTATED RINGERS IV SOLN
INTRAVENOUS | Status: DC | PRN
  Administered 2017-08-15 (×3): via INTRAVENOUS

## 2017-08-15 MED ORDER — ONDANSETRON HCL 4 MG/2ML IJ SOLN
INTRAMUSCULAR | Status: DC | PRN
  Administered 2017-08-15: 4 mg via INTRAVENOUS

## 2017-08-15 MED ORDER — SUGAMMADEX SODIUM 200 MG/2ML IV SOLN
INTRAVENOUS | Status: AC
  Filled 2017-08-15: qty 4

## 2017-08-15 MED ORDER — FENTANYL CITRATE (PF) 250 MCG/5ML IJ SOLN
INTRAMUSCULAR | Status: DC | PRN
  Administered 2017-08-15: 50 ug via INTRAVENOUS
  Administered 2017-08-15: 100 ug via INTRAVENOUS

## 2017-08-15 MED ORDER — SUGAMMADEX SODIUM 200 MG/2ML IV SOLN
INTRAVENOUS | Status: DC | PRN
  Administered 2017-08-15: 800 mg via INTRAVENOUS

## 2017-08-15 MED ORDER — MIDAZOLAM HCL 2 MG/2ML IJ SOLN
INTRAMUSCULAR | Status: AC
  Filled 2017-08-15: qty 2

## 2017-08-15 MED ORDER — ONDANSETRON HCL 4 MG/2ML IJ SOLN: 4.0000 mg | Freq: Once | INTRAMUSCULAR | Status: DC | PRN

## 2017-08-15 MED ORDER — LIDOCAINE 2% (20 MG/ML) 5 ML SYRINGE
INTRAMUSCULAR | Status: AC
  Filled 2017-08-15: qty 5

## 2017-08-15 MED ORDER — SODIUM CHLORIDE 0.9 % IJ SOLN
INTRAMUSCULAR | Status: DC | PRN
  Administered 2017-08-15: 80 mL

## 2017-08-15 MED ORDER — FENTANYL CITRATE (PF) 250 MCG/5ML IJ SOLN
INTRAMUSCULAR | Status: AC
  Filled 2017-08-15: qty 5

## 2017-08-15 MED ORDER — PROPOFOL 10 MG/ML IV BOLUS
INTRAVENOUS | Status: DC | PRN
  Administered 2017-08-15: 140 mg via INTRAVENOUS

## 2017-08-15 MED ORDER — LIDOCAINE-EPINEPHRINE 1 %-1:100000 IJ SOLN
INTRAMUSCULAR | Status: DC | PRN
  Administered 2017-08-15: 30 mL

## 2017-08-15 MED ORDER — LACTATED RINGERS IV SOLN
INTRAVENOUS | Status: DC
  Administered 2017-08-15: 17:00:00 via INTRAVENOUS

## 2017-08-15 MED ORDER — LIDOCAINE 2% (20 MG/ML) 5 ML SYRINGE
INTRAMUSCULAR | Status: DC | PRN
  Administered 2017-08-15: 100 mg via INTRAVENOUS

## 2017-08-15 MED ORDER — EPHEDRINE SULFATE 50 MG/ML IJ SOLN
INTRAMUSCULAR | Status: DC | PRN
  Administered 2017-08-15 (×4): 5 mg via INTRAVENOUS
  Administered 2017-08-15: 10 mg via INTRAVENOUS

## 2017-08-15 MED ORDER — LIDOCAINE-EPINEPHRINE 1 %-1:100000 IJ SOLN
INTRAMUSCULAR | Status: AC
  Filled 2017-08-15: qty 1

## 2017-08-15 MED ORDER — BUPIVACAINE LIPOSOME 1.3 % IJ SUSP
20.0000 mL | Freq: Once | INTRAMUSCULAR | Status: DC
  Filled 2017-08-15: qty 20

## 2017-08-15 MED ORDER — 0.9 % SODIUM CHLORIDE (POUR BTL) OPTIME
TOPICAL | Status: DC | PRN
  Administered 2017-08-15: 1000 mL

## 2017-08-15 MED ORDER — BUPIVACAINE HCL (PF) 0.25 % IJ SOLN
INTRAMUSCULAR | Status: AC
  Filled 2017-08-15: qty 60

## 2017-08-15 MED ORDER — ROCURONIUM BROMIDE 10 MG/ML (PF) SYRINGE
PREFILLED_SYRINGE | INTRAVENOUS | Status: DC | PRN
  Administered 2017-08-15: 20 mg via INTRAVENOUS
  Administered 2017-08-15: 10 mg via INTRAVENOUS
  Administered 2017-08-15: 20 mg via INTRAVENOUS
  Administered 2017-08-15: 50 mg via INTRAVENOUS

## 2017-08-15 MED ORDER — HEPARIN SODIUM (PORCINE) 5000 UNIT/ML IJ SOLN
5000.0000 [IU] | Freq: Three times a day (TID) | INTRAMUSCULAR | Status: DC
  Administered 2017-08-16 – 2017-08-21 (×17): 5000 [IU] via SUBCUTANEOUS
  Filled 2017-08-15 (×16): qty 1

## 2017-08-15 MED ORDER — LACTATED RINGERS IV SOLN: INTRAVENOUS | Status: DC

## 2017-08-15 MED ORDER — PHENOL 1.4 % MT LIQD: 2.0000 | OROMUCOSAL | Status: DC | PRN

## 2017-08-15 MED ORDER — BUPIVACAINE HCL (PF) 0.25 % IJ SOLN
INTRAMUSCULAR | Status: DC | PRN
  Administered 2017-08-15: 60 mL

## 2017-08-15 MED ORDER — MIDAZOLAM HCL 2 MG/2ML IJ SOLN
INTRAMUSCULAR | Status: DC | PRN
  Administered 2017-08-15: 2 mg via INTRAVENOUS

## 2017-08-15 MED ORDER — SODIUM CHLORIDE 0.9 % IV SOLN
INTRAVENOUS | Status: DC | PRN
  Administered 2017-08-15: 500 mL

## 2017-08-15 MED ORDER — SODIUM CHLORIDE 0.9 % IR SOLN
Status: DC | PRN
  Administered 2017-08-15: 3000 mL

## 2017-08-15 MED ORDER — HYDROMORPHONE HCL 1 MG/ML IJ SOLN: 0.2500 mg | INTRAMUSCULAR | Status: DC | PRN

## 2017-08-15 MED ORDER — PROPOFOL 10 MG/ML IV BOLUS
INTRAVENOUS | Status: AC
  Filled 2017-08-15: qty 20

## 2017-08-15 MED ORDER — MEPERIDINE HCL 25 MG/ML IJ SOLN: 6.2500 mg | INTRAMUSCULAR | Status: DC | PRN

## 2017-08-15 SURGICAL SUPPLY — 52 items
BAG DECANTER FOR FLEXI CONT (MISCELLANEOUS) ×1 IMPLANT
BINDER ABDOMINAL 12 ML 46-62 (SOFTGOODS) ×1 IMPLANT
BLADE CLIPPER SURG (BLADE) IMPLANT
BNDG GAUZE ELAST 4 BULKY (GAUZE/BANDAGES/DRESSINGS) ×6 IMPLANT
CANISTER SUCT 3000ML PPV (MISCELLANEOUS) ×2 IMPLANT
CHLORAPREP W/TINT 26ML (MISCELLANEOUS) IMPLANT
CONT SPEC 4OZ CLIKSEAL STRL BL (MISCELLANEOUS) IMPLANT
COVER SURGICAL LIGHT HANDLE (MISCELLANEOUS) ×2 IMPLANT
DECANTER SPIKE VIAL GLASS SM (MISCELLANEOUS) ×1 IMPLANT
DRAPE HALF SHEET 40X57 (DRAPES) IMPLANT
DRAPE IMP U-DRAPE 54X76 (DRAPES) ×1 IMPLANT
DRAPE INCISE IOBAN 66X45 STRL (DRAPES) IMPLANT
DRAPE LAPAROTOMY T 98X78 PEDS (DRAPES) ×1 IMPLANT
DRSG ADAPTIC 3X8 NADH LF (GAUZE/BANDAGES/DRESSINGS) IMPLANT
DRSG PAD ABDOMINAL 8X10 ST (GAUZE/BANDAGES/DRESSINGS) ×1 IMPLANT
DRSG VAC ATS LRG SENSATRAC (GAUZE/BANDAGES/DRESSINGS) IMPLANT
DRSG VAC ATS MED SENSATRAC (GAUZE/BANDAGES/DRESSINGS) IMPLANT
DRSG VAC ATS SM SENSATRAC (GAUZE/BANDAGES/DRESSINGS) IMPLANT
ELECT CAUTERY BLADE 6.4 (BLADE) ×2 IMPLANT
ELECT REM PT RETURN 9FT ADLT (ELECTROSURGICAL) ×2
ELECTRODE REM PT RTRN 9FT ADLT (ELECTROSURGICAL) ×1 IMPLANT
GAUZE SPONGE 4X4 12PLY STRL (GAUZE/BANDAGES/DRESSINGS) ×1 IMPLANT
GLOVE BIO SURGEON STRL SZ 6 (GLOVE) ×2 IMPLANT
GLOVE BIOGEL PI IND STRL 7.0 (GLOVE) IMPLANT
GLOVE BIOGEL PI IND STRL 7.5 (GLOVE) IMPLANT
GLOVE BIOGEL PI INDICATOR 7.0 (GLOVE) ×1
GLOVE BIOGEL PI INDICATOR 7.5 (GLOVE)
GLOVE SURG SS PI 6.0 STRL IVOR (GLOVE) ×2 IMPLANT
GOWN STRL REUS W/ TWL LRG LVL3 (GOWN DISPOSABLE) ×2 IMPLANT
GOWN STRL REUS W/TWL LRG LVL3 (GOWN DISPOSABLE) ×4
HANDPIECE INTERPULSE COAX TIP (DISPOSABLE) ×2
KIT BASIN OR (CUSTOM PROCEDURE TRAY) ×2 IMPLANT
KIT ROOM TURNOVER OR (KITS) ×2 IMPLANT
NDL HYPO 25GX1X1/2 BEV (NEEDLE) IMPLANT
NEEDLE HYPO 25GX1X1/2 BEV (NEEDLE) ×6 IMPLANT
NS IRRIG 1000ML POUR BTL (IV SOLUTION) ×2 IMPLANT
PACK GENERAL/GYN (CUSTOM PROCEDURE TRAY) ×2 IMPLANT
PACK UNIVERSAL I (CUSTOM PROCEDURE TRAY) ×1 IMPLANT
PAD ABD 8X10 STRL (GAUZE/BANDAGES/DRESSINGS) ×2 IMPLANT
PAD ARMBOARD 7.5X6 YLW CONV (MISCELLANEOUS) ×4 IMPLANT
SET HNDPC FAN SPRY TIP SCT (DISPOSABLE) IMPLANT
SPONGE LAP 18X18 X RAY DECT (DISPOSABLE) ×1 IMPLANT
STAPLER VISISTAT 35W (STAPLE) ×1 IMPLANT
SURGILUBE 2OZ TUBE FLIPTOP (MISCELLANEOUS) IMPLANT
SUT PROLENE 0 CT (SUTURE) ×3 IMPLANT
SUT VIC AB 5-0 PS2 18 (SUTURE) IMPLANT
SWAB COLLECTION DEVICE MRSA (MISCELLANEOUS) ×1 IMPLANT
SWAB CULTURE ESWAB REG 1ML (MISCELLANEOUS) ×2 IMPLANT
SYR 10ML LL (SYRINGE) ×1 IMPLANT
TOWEL OR 17X24 6PK STRL BLUE (TOWEL DISPOSABLE) ×2 IMPLANT
TOWEL OR 17X26 10 PK STRL BLUE (TOWEL DISPOSABLE) ×2 IMPLANT
UNDERPAD 30X30 (UNDERPADS AND DIAPERS) ×2 IMPLANT

## 2017-08-15 NOTE — Transfer of Care (Signed)
Immediate Anesthesia Transfer of Care Note  Patient: Jacqueline Goodman  Procedure(s) Performed: Ananias Pilgrim AND DEBRIDEMENT OF ABDOMINAL WOUND (N/A Abdomen)  Patient Location: PACU  Anesthesia Type:General  Level of Consciousness: awake, alert  and oriented  Airway & Oxygen Therapy: Patient connected to nasal cannula oxygen  Post-op Assessment: Report given to RN and Post -op Vital signs reviewed and stable  Post vital signs: Reviewed and stable  Last Vitals:  Vitals:   08/14/17 2125 08/15/17 0534  BP: 138/62 (!) 123/46  Pulse: 80 65  Resp: (!) 21 12  Temp: 36.8 C 36.8 C  SpO2: 100% 97%    Last Pain:  Vitals:   08/15/17 2023  TempSrc:   PainSc: (P) 0-No pain      Patients Stated Pain Goal: 1 (60/63/01 6010)  Complications: No apparent anesthesia complications

## 2017-08-15 NOTE — Brief Op Note (Signed)
08/15/2017  8:41 PM  PATIENT:  Koren Shiver Tussey  66 y.o. 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 total)  SPECIMEN:  No Specimen  DISPOSITION OF SPECIMEN:  N/A  COUNTS:  YES  DICTATION: .Other Dictation: Dictation Number [id number]  PLAN OF CARE: Admit to inpatient   PATIENT DISPOSITION:  PACU - hemodynamically stable.   Delay start of Pharmacological VTE agent (>24hrs) due to surgical blood loss or risk of bleeding: not applicable

## 2017-08-15 NOTE — Discharge Summary (Signed)
[facility] Discharge Summary  Patient name: Jacqueline Goodman Medical record number: [id number] Date of birth: [birth year] Age: 66 y.o. Gender: female Date of Admission: 08/10/2017  Date of Discharge: 08/21/17 Admitting Physician: Leeanne Rio, MD  Primary Care Provider: Lind Covert, MD Consultants: Plastic Surgery  Indication for Hospitalization: hypoglycemia due to wound infection  Discharge Diagnoses/Problem List:  I&D for infection of wound s/p tummy tuck  T2DM Insomnia HTN Hypoalbuminemia Hyponatremia Nausea  Disposition: home with HHPT  Discharge Condition: stable  Discharge Exam: *from morning progress rounds General: Was getting wound redressed when I went by Cardiovascular: RRR, no mrg Respiratory: CTABL, no crackles, rhonchi, or wheezing Abdomen: wound appears free of gross infection/drainage, being packed by wound nurse at time of rounds  Extremities: no deficits noted but not ambulating during wound dressing change Neuro: no focal deficits  Brief Hospital Course:  This is a 66 year old female who presented with hypoglycemia and weakness.  Past medical history significant for recent tummy tuck on 11/26 on Duricef 500 mg twice daily 11/27-12/2, T2 DM, hypertension, and hyperlipidemia.  Patient was brought into the ED by EMS who found her to be altered and unresponsive with a blood sugar to be as low as 25.  EMS had given an amp of D50 prior to presentation to the ED.  Daughter reported that since her tummy tuck on 11/26 patient had had very little appetite.  She takes glimepiride at home as well.  On arrival to the ED CXR was suggestive of pneumonia.  Crackles were noted on exam as well.  WBC was elevated to 14.5 with neutrophils of 11.7.  Azithromycin and CTX were given in the ED and continued until the evening.  Upon dressing change of her surgical abdominal wound on the evening of 12/15, the wound was found to be dehiscent  and have purulent drainage.  Her plastic surgeon was consulted and notified.  Antibiotics were adjusted by the primary team to vancomycin and Zosyn on the evening of 12/15 for broad-spectrum coverage.    A CT abdomen and pelvis was ordered in order to rule out drainable abscess, which was negative for drainable abscess.    On 12/18 patient was clear of any signs of pneumonia including crackles and her white blood cell count was much improved.  Patient remained afebrile throughout hospitalization. She was cleared for surgery by primary team on 12/19.   Patient was seen by plastic surgeon who decided to take her back to do an I&D in the OR on the evening of 12/20.  Recommendations per her were to continue vancomycin and Zosyn after surgery and switch to oral antibiotics at least 24 hours after surgery.  Patient was started on clindamycin 600 mg every 4 hours on 12/22 to be continued until 72 hours after surgery.  After I&D plastic surgery recommended that patient's blood sugars remain below 150.  Due to patient's presentation with hypoglycemia goal blood sugars were 135-150.  Patient was on sensitive sliding scale insulin and her home glimepiride was also restarted on 12/21.  Issues for Follow Up:  1. Patient goal CBGs between 135-150 after I&D. 2. Patient using Benadryl nightly as a sleep aid.  Discontinued while admitted and started on ramelteon which patient reports has helped her sleep at night.  Recommend discontinuing Benadryl due to patient's age.  Patient will be sent home with ramelteon as a sleep aid. 3. Patient will need close follow-up by plastic surgeon who performed tummy tuck, please verify  appt has been made. 4. Due to patient's episode of hypoglycemia may consider changing her glimepiride in the outpatient setting once her infection and wound has healed. 5. Patient was supposed to be getting m-w-f wound dressing checks by home health, please verify this is happening  Significant  Procedures:  08/15/2017: I&D of abdominal wound from tummy tuck   Significant Labs and Imaging:  Recent Labs  Lab 08/15/17 0630 08/16/17 0718 08/17/17 1026  WBC 10.5 10.5 11.2*  HGB 8.5* 8.3* 8.6*  HCT 25.6* 25.2* 26.9*  PLT 460* 456* 449*   Recent Labs  Lab 08/15/17 0630 08/16/17 0718 08/17/17 1026  NA 132* 130* 132*  K 4.2 4.4 4.4  CL 101 100* 100*  CO2 '26 25 26  ' GLUCOSE 136* 179* 154*  BUN '12 12 15  ' CREATININE 0.67 0.64 0.77  CALCIUM 8.0* 7.9* 7.7*  ALKPHOS  --   --  106  AST  --   --  19  ALT  --   --  22  ALBUMIN  --   --  1.8*    Results/Tests Pending at Time of Discharge:   Discharge Medications:  Allergies as of 08/21/2017      Reactions   Morphine And Related Other (See Comments)   "says doesn't work"   Tape Rash   Breaks the skin down      Medication List    STOP taking these medications   diphenhydrAMINE 25 mg capsule Commonly known as:  BENADRYL     TAKE these medications   clindamycin 300 MG capsule Commonly known as:  CLEOCIN Take 1 capsule (300 mg total) by mouth every 6 (six) hours for 8 days.   feeding supplement (GLUCERNA SHAKE) Liqd Take 237 mLs by mouth 3 (three) times daily between meals.   feeding supplement (PRO-STAT SUGAR FREE 64) Liqd Take 30 mLs by mouth 3 (three) times daily.   glimepiride 2 MG tablet Commonly known as:  AMARYL Take 1 tablet (2 mg total) by mouth daily with breakfast. What changed:    medication strength  how much to take   glucose blood test strip Commonly known as:  ONE TOUCH ULTRA TEST Use to test blood sugar once every morning before eating. ICD-10 Code: E11.9   losartan 25 MG tablet Commonly known as:  COZAAR Take 3 tablets (75 mg total) by mouth daily. What changed:  Another medication with the same name was added. Make sure you understand how and when to take each.   losartan 50 MG tablet Commonly known as:  COZAAR Take 1 tablet (50 mg total) by mouth daily. What changed:  You were  already taking a medication with the same name, and this prescription was added. Make sure you understand how and when to take each.   ONE TOUCH ULTRA 2 w/Device Kit Check blood sugar once every morning before eating. ICD-10 Code: E11.9   oxyCODONE-acetaminophen 5-325 MG tablet Commonly known as:  PERCOCET/ROXICET Take 1-2 tablets by mouth every 4 (four) hours as needed for severe pain. What changed:  Another medication with the same name was added. Make sure you understand how and when to take each.   oxyCODONE-acetaminophen 5-325 MG tablet Commonly known as:  PERCOCET/ROXICET Take 1 tablet by mouth every 4 (four) hours as needed for up to 7 days for severe pain. What changed:  You were already taking a medication with the same name, and this prescription was added. Make sure you understand how and when to take each.  polyethylene glycol packet Commonly known as:  MIRALAX / GLYCOLAX Take 17 g by mouth daily as needed for mild constipation.   ramelteon 8 MG tablet Commonly known as:  ROZEREM Take 1 tablet (8 mg total) by mouth at bedtime.       Discharge Instructions: Please refer to Patient Instructions section of EMR for full details.  Patient was counseled important signs and symptoms that should prompt return to medical care, changes in medications, dietary instructions, activity restrictions, and follow up appointments.   Follow-Up Appointments: Follow-up Information    [facility]-Home Follow up.   Specialty:  [place] Why:  Home Health RN-will call to arrange initial visit/ wound vac Contact information: [address] [place] [address] [phone]        Contogiannis, Audrea Muscat, MD. Schedule an appointment as soon as possible for a visit.   Specialty:  Plastic Surgery Contact information: [place] [address] [phone]        Marjie Skiff, MD. Go on 08/23/2017.   Specialty:  Family Medicine Why:   1:30pm Contact information: [place] [address] [phone]        Lind Covert, MD. Go on 08/28/2017.   Specialty:  Family Medicine Why:  2:10pm Contact information: [place] [address] [phone]           Sherene Sires, DO 08/21/2017, 6:19 PM PGY-1, [place]

## 2017-08-15 NOTE — Progress Notes (Addendum)
Physical Therapy Treatment Patient Details Name: Jacqueline Goodman MRN: [id number] DOB: 04/08/1951 Today's Date: 08/15/2017    History of Present Illness Jacqueline Goodman is a 66 y.o. female presenting with hypoglycemia and generalized weakness found to have pneumonia. Recent tummy tuck incision with drain infected and dehiscence.  PMH is significant for T2DM, HTN, HLD, s/p tummy tuck 07/22/2017    PT Comments    Pt making steady progress with functional mobility. She tolerated ambulating a further distance this session with occasional standing rest breaks. No reports of increased pain with mobility. Pt would continue to benefit from skilled physical therapy services at this time while admitted and after d/c to address the below listed limitations in order to improve overall safety and independence with functional mobility.    Follow Up Recommendations  Home health PT;Supervision/Assistance - 24 hour     Equipment Recommendations  None recommended by PT    Recommendations for Other Services       Precautions / Restrictions Precautions Precautions: Fall Restrictions Weight Bearing Restrictions: No    Mobility  Bed Mobility Overal bed mobility: Needs Assistance Bed Mobility: Rolling;Sidelying to Sit Rolling: Supervision Sidelying to sit: Mod assist       General bed mobility comments: assist to elevate trunk  Transfers Overall transfer level: Needs assistance Equipment used: None Transfers: Sit to/from Stand Sit to Stand: Supervision         General transfer comment: increased time to perform, no physical assist requried  Ambulation/Gait Ambulation/Gait assistance: Supervision Ambulation Distance (Feet): 175 Feet Assistive device: (pushed IV pole) Gait Pattern/deviations: Step-through pattern;Decreased stride length Gait velocity: decreased Gait velocity interpretation: Below normal speed for age/gender General Gait Details: steady with gait with single UE  support on IV pole   Stairs            Wheelchair Mobility    Modified Rankin (Stroke Patients Only)       Balance Overall balance assessment: Needs assistance Sitting-balance support: No upper extremity supported;Feet supported Sitting balance-Leahy Scale: Fair     Standing balance support: During functional activity Standing balance-Leahy Scale: Fair                              Cognition Arousal/Alertness: Awake/alert Behavior During Therapy: WFL for tasks assessed/performed Overall Cognitive Status: Within Functional Limits for tasks assessed                                        Exercises      General Comments        Pertinent Vitals/Pain Pain Assessment: Faces Faces Pain Scale: No hurt    Home Living                      Prior Function            PT Goals (current goals can now be found in the care plan section) Acute Rehab PT Goals PT Goal Formulation: With patient Time For Goal Achievement: 08/25/17 Potential to Achieve Goals: Good Progress towards PT goals: Progressing toward goals    Frequency    Min 3X/week      PT Plan Current plan remains appropriate    Co-evaluation              AM-PAC PT "6 Clicks" Daily Activity  Outcome Measure  Difficulty  turning over in bed (including adjusting bedclothes, sheets and blankets)?: None Difficulty moving from lying on back to sitting on the side of the bed? : Unable Difficulty sitting down on and standing up from a chair with arms (e.g., wheelchair, bedside commode, etc,.)?: A Little Help needed moving to and from a bed to chair (including a wheelchair)?: None Help needed walking in hospital room?: None Help needed climbing 3-5 steps with a railing? : A Little 6 Click Score: 19    End of Session   Activity Tolerance: Patient tolerated treatment well Patient left: in chair;with call bell/phone within reach;with family/visitor present Nurse  Communication: Mobility status PT Visit Diagnosis: Muscle weakness (generalized) (M62.81);Unsteadiness on feet (R26.81)     Time: 0881-1031 PT Time Calculation (min) (ACUTE ONLY): 26 min  Charges:  $Gait Training: 8-22 mins $Therapeutic Activity: 8-22 mins                    G Codes:       [place], [place], [place] 08/15/2017, 10:56 AM

## 2017-08-15 NOTE — Anesthesia Preprocedure Evaluation (Addendum)
Anesthesia Evaluation  Patient identified by MRN, date of birth, ID band Patient awake    Reviewed: Allergy & Precautions, NPO status , Patient's Chart, lab work & pertinent test results  Airway Mallampati: I  TM Distance: >3 FB Neck ROM: Full    Dental   Pulmonary    Pulmonary exam normal        Cardiovascular hypertension, Pt. on medications Normal cardiovascular exam     Neuro/Psych PSYCHIATRIC DISORDERS Anxiety negative neurological ROS     GI/Hepatic negative GI ROS, Neg liver ROS,   Endo/Other  diabetes, Type 2, Oral Hypoglycemic Agents  Renal/GU   negative genitourinary   Musculoskeletal negative musculoskeletal ROS (+)   Abdominal   Peds  Hematology   Anesthesia Other Findings   Reproductive/Obstetrics negative OB ROS                            Anesthesia Physical Anesthesia Plan  ASA: III  Anesthesia Plan: General   Post-op Pain Management:    Induction: Intravenous  PONV Risk Score and Plan: 3 and Midazolam, Ondansetron and Treatment may vary due to age or medical condition  Airway Management Planned: Oral ETT  Additional Equipment:   Intra-op Plan:   Post-operative Plan: Extubation in OR  Informed Consent: I have reviewed the patients History and Physical, chart, labs and discussed the procedure including the risks, benefits and alternatives for the proposed anesthesia with the patient or authorized representative who has indicated his/her understanding and acceptance.     Plan Discussed with: CRNA and Surgeon  Anesthesia Plan Comments:         Anesthesia Quick Evaluation

## 2017-08-15 NOTE — Anesthesia Procedure Notes (Signed)
Procedure Name: Intubation Date/Time: 08/15/2017 5:50 PM Performed by: Bryson Corona, CRNA Pre-anesthesia Checklist: Patient identified, Emergency Drugs available, Suction available and Patient being monitored Patient Re-evaluated:Patient Re-evaluated prior to induction Oxygen Delivery Method: Circle System Utilized Preoxygenation: Pre-oxygenation with 100% oxygen Induction Type: IV induction Ventilation: Mask ventilation without difficulty Laryngoscope Size: Miller and 2 Grade View: Grade I Tube type: Oral Tube size: 7.0 mm Number of attempts: 1 Airway Equipment and Method: Stylet and Oral airway Placement Confirmation: ETT inserted through vocal cords under direct vision,  positive ETCO2 and breath sounds checked- equal and bilateral Secured at: 23 cm Tube secured with: Tape Dental Injury: Teeth and Oropharynx as per pre-operative assessment

## 2017-08-15 NOTE — Progress Notes (Signed)
FPTS Interim Progress Note  S: Patient is out of OR resting comfortably in bed. She states her throat is sore and does not want to speak and has some dizziness and nausea but otherwise feels good. She denies fever, chills, difficulty breathing, chest pain, abdominal pain, vomiting.   O: Resp: CTAB in frontal lung fields, no wheezing, no crackles      Cardio: RRR, normal S1, S2, no murmurs BP (!) 129/51   Pulse 83   Temp 97.8 F (36.6 C)   Resp 19   Ht 5\' 9"  (1.753 m)   Wt 228 lb (103.4 kg)   SpO2 96%   BMI 33.67 kg/m     A/P: I have restarted her DVT prophylaxis with Heparin, given her a carb modified diet. I have given her a throat spray to use for her sore throat post op and she is on compazine for nausea.  Nuala Alpha, DO 08/15/2017, 10:04 PM PGY-1, [place] Medicine Service pager [phone]

## 2017-08-15 NOTE — Progress Notes (Signed)
Family Medicine Teaching Service Daily Progress Note Intern Pager: [phone]  Patient name: Jacqueline Goodman Medical record number: [id number] Date of birth: 22-Aug-1951 Age: 66 y.o. Gender: female  Primary Care Provider: Lind Covert, MD Consultants: Wound Care Code Status: Full  Pt Overview and Major Events to Date:  Jacqueline Goodman is a 66 y.o. female presenting with hypoglycemia and generalized weakness found to have pneumonia. PMH is significant for T2DM, HTN, HLD, s/p tummy tuck 07/22/2017.  Assessment and Plan:  Poor wound healing s/p tummy tuck: Stable. on 11/26. Plastic surgeon to take her back to OR 12/20 -Blood cx with NGx4 -Plastic surgeon is following who performed operation -Percocet 5-325mg  q4 prn pain -Started vanc (12/15-), and Zosyn (12/15-), will deescalate after surgery -cleared for surgery  CAP: Resolved. Asymptomatic. No crackles on exam today. CXR suggestive of pneumonia in the setting of leukocytosis with increased neutrophils. -Azithromycin (12/15) and CTX (12/15), switched to Vanc (12/15-) and Zosyn (12/15-) -Monitor WBC on CBC -Monitor O2 sats and give supplemental oxygen if sats below 92%  T2DM: Well controlled with last hemoglobin A1c of 6.5 in October 2018. -We will hold home glimepiride, consider switching to metformin after surgery for better glycemic control -SSI and CBGs at meals and bedtimes  Insomnia: Stable.  -Will hold home benadryl and suggest discontinuing this prior to discharge -Given ramelteon for sleep   HTN: Normotensive overnight. Last BP 123/46. -Decrease home losartan 50mg  -Monitor  Hypoalbuminemia: Albumin 1.8 on admission.  -Could be adding to decreased wound healing. -Nutrition consulted  Hyponatremia: NA 132 today.  -Will monitor on BMP -stopped IVF  Nausea: Stable.  -Compazine for nausea  FEN/GI: NPO until surgery Prophylaxis: SCDs  Disposition: pending surgical debridement by plastic  surgery  Subjective:  Patient walking the halls this morning and encouraged to get better prior to Christmas.   Objective: Temp:  [98.2 F (36.8 C)] 98.2 F (36.8 C) (12/20 0534) Pulse Rate:  [65-80] 65 (12/20 0534) Resp:  [12-21] 12 (12/20 0534) BP: (123-138)/(46-62) 123/46 (12/20 0534) SpO2:  [97 %-100 %] 97 % (12/20 0534) Weight:  [228 lb (103.4 kg)] 228 lb (103.4 kg) (12/19 2125) Physical Exam: General: NAD, sitting up in chair Cardiovascular: RRR, no mrg Respiratory: CTABL, no crackles, rhonchi, or wheezing Abdomen: abdominal binder on midsection Extremities: moves all 4 extremities equally  Laboratory: Recent Labs  Lab 08/13/17 0739 08/14/17 0519 08/15/17 0630  WBC 9.3 11.9* 10.5  HGB 8.2* 9.0* 8.5*  HCT 24.8* 28.0* 25.6*  PLT 453* 558* 460*   Recent Labs  Lab 08/10/17 0118 08/11/17 0637  08/13/17 0739 08/14/17 0519 08/15/17 0630  NA 128* 129*   < > 128* 132* 132*  K 4.3 4.2   < > 4.6 4.4 4.2  CL 98* 99*   < > 100* 98* 101  CO2 20* 22   < > 22 26 26   BUN 24* 27*   < > 18 14 12   CREATININE 0.74 0.92   < > 0.72 0.71 0.67  CALCIUM 8.3* 7.8*   < > 7.7* 8.3* 8.0*  PROT 6.8 5.6*  --   --   --   --   BILITOT 0.4 0.4  --   --   --   --   ALKPHOS 161* 125  --   --   --   --   ALT 67* 52  --   --   --   --   AST 80* 44*  --   --   --   --  GLUCOSE 75 107*   < > 159* 144* 136*   < > = values in this interval not displayed.    Imaging/Diagnostic Tests: No results found.  Tranika Scholler, [name], DO 08/15/2017, 10:39 AM PGY-1, [place] Intern pager: [phone], text pages welcome

## 2017-08-15 NOTE — Progress Notes (Signed)
Occupational Therapy Treatment Patient Details Name: Jacqueline Goodman MRN: [id number] DOB: 10/02/1950 Today's Date: 08/15/2017    History of present illness Jacqueline Goodman is a 66 y.o. female presenting with hypoglycemia and generalized weakness found to have pneumonia. Recent tummy tuck incision with drain infected and dehiscence.  PMH is significant for T2DM, HTN, HLD, s/p tummy tuck 07/22/2017   OT comments  Session focused on Pt and Daughter's education with AE for LB bathing/dressing to maximize independence and for pain management. Pt making excellent progress towards OT goals. OT will continue to follow in the acute setting - will see tomorrow post-op for follow up.  Follow Up Recommendations  Home health OT;Supervision/Assistance - 24 hour    Equipment Recommendations  None recommended by OT    Recommendations for Other Services      Precautions / Restrictions Precautions Precautions: Fall Restrictions Weight Bearing Restrictions: No       Mobility Bed Mobility Overal bed mobility: Needs Assistance Bed Mobility: Rolling;Sidelying to Sit Rolling: Supervision Sidelying to sit: Mod assist       General bed mobility comments: assist to elevate trunk  Transfers Overall transfer level: Needs assistance Equipment used: None Transfers: Sit to/from Stand Sit to Stand: Supervision         General transfer comment: increased time to perform, no physical assist requried    Balance Overall balance assessment: Needs assistance Sitting-balance support: No upper extremity supported;Feet supported Sitting balance-Leahy Scale: Fair     Standing balance support: During functional activity Standing balance-Leahy Scale: Fair                             ADL either performed or assessed with clinical judgement   ADL               Lower Body Bathing: Min guard;With adaptive equipment;Sit to/from stand Lower Body Bathing Details (indicate cue type and  reason): educated Pt and daughter in use of long handle sponge Upper Body Dressing : Set up;Sitting;Min guard;With adaptive equipment     Lower Body Dressing Details (indicate cue type and reason): total A without AE; with AE min guard A with VCs                     Vision       Perception     Praxis      Cognition Arousal/Alertness: Awake/alert Behavior During Therapy: WFL for tasks assessed/performed Overall Cognitive Status: Within Functional Limits for tasks assessed                                          Exercises     Shoulder Instructions       General Comments      Pertinent Vitals/ Pain       Pain Assessment: Faces Faces Pain Scale: No hurt Pain Intervention(s): Monitored during session  Home Living                                          Prior Functioning/Environment              Frequency  Min 2X/week        Progress Toward Goals  OT Goals(current goals can now be  found in the care plan section)  Progress towards OT goals: Progressing toward goals  Acute Rehab OT Goals Patient Stated Goal: to go home OT Goal Formulation: With patient/family Time For Goal Achievement: 08/26/17 Potential to Achieve Goals: Good  Plan Discharge plan remains appropriate;Frequency remains appropriate    Co-evaluation                 AM-PAC PT "6 Clicks" Daily Activity     Outcome Measure   Help from another person eating meals?: None Help from another person taking care of personal grooming?: A Little Help from another person toileting, which includes using toliet, bedpan, or urinal?: A Lot Help from another person bathing (including washing, rinsing, drying)?: A Lot Help from another person to put on and taking off regular upper body clothing?: A Little Help from another person to put on and taking off regular lower body clothing?: Total 6 Click Score: 15    End of Session    OT Visit Diagnosis:  Unsteadiness on feet (R26.81);Muscle weakness (generalized) (M62.81)   Activity Tolerance Patient tolerated treatment well   Patient Left in bed;with call bell/phone within reach;with family/visitor present   Nurse Communication Mobility status        Time: 1448-1540 OT Time Calculation (min): 52 min  Charges: OT General Charges $OT Visit: 1 Visit OT Treatments $Self Care/Home Management : 8-22 mins  Hulda Humphrey OTR/L [place] 08/15/2017, 5:09 PM

## 2017-08-16 ENCOUNTER — Encounter (HOSPITAL_COMMUNITY): Payer: Self-pay | Admitting: Plastic Surgery

## 2017-08-16 LAB — BASIC METABOLIC PANEL
Anion gap: 5 (ref 5–15)
BUN: 12 mg/dL (ref 6–20)
CO2: 25 mmol/L (ref 22–32)
Calcium: 7.9 mg/dL — ABNORMAL LOW (ref 8.9–10.3)
Chloride: 100 mmol/L — ABNORMAL LOW (ref 101–111)
Creatinine, Ser: 0.64 mg/dL (ref 0.44–1.00)
GFR calc Af Amer: >60 mL/min (ref >60)
GFR calc non Af Amer: >60 mL/min (ref >60)
Glucose, Bld: 179 mg/dL — ABNORMAL HIGH (ref 65–99)
Potassium: 4.4 mmol/L (ref 3.5–5.1)
Sodium: 130 mmol/L — ABNORMAL LOW (ref 135–145)

## 2017-08-16 LAB — CBC
HCT: 25.2 % — ABNORMAL LOW (ref 36.0–46.0)
Hemoglobin: 8.3 g/dL — ABNORMAL LOW (ref 12.0–15.0)
MCH: 27.8 pg (ref 26.0–34.0)
MCHC: 32.9 g/dL (ref 30.0–36.0)
MCV: 84.3 fL (ref 78.0–100.0)
Platelets: 456 10*3/uL — ABNORMAL HIGH (ref 150–400)
RBC: 2.99 MIL/uL — ABNORMAL LOW (ref 3.87–5.11)
RDW: 13.4 % (ref 11.5–15.5)
WBC: 10.5 10*3/uL (ref 4.0–10.5)

## 2017-08-16 LAB — GLUCOSE, CAPILLARY
Glucose-Capillary: 198 mg/dL — ABNORMAL HIGH (ref 65–99)
Glucose-Capillary: 195 mg/dL — ABNORMAL HIGH (ref 65–99)
Glucose-Capillary: 183 mg/dL — ABNORMAL HIGH (ref 65–99)
Glucose-Capillary: 93 mg/dL (ref 65–99)

## 2017-08-16 MED ORDER — METFORMIN HCL 500 MG PO TABS: 500.0000 mg | ORAL_TABLET | Freq: Two times a day (BID) | ORAL | Status: DC

## 2017-08-16 MED ORDER — INSULIN ASPART 100 UNIT/ML ~~LOC~~ SOLN
0.0000 [IU] | Freq: Four times a day (QID) | SUBCUTANEOUS | Status: DC
  Administered 2017-08-16: 2 [IU] via SUBCUTANEOUS
  Administered 2017-08-17: 1 [IU] via SUBCUTANEOUS
  Administered 2017-08-19: 2 [IU] via SUBCUTANEOUS
  Administered 2017-08-19: 3 [IU] via SUBCUTANEOUS
  Administered 2017-08-20 (×2): 2 [IU] via SUBCUTANEOUS
  Administered 2017-08-20 – 2017-08-21 (×3): 1 [IU] via SUBCUTANEOUS

## 2017-08-16 MED ORDER — INSULIN ASPART 100 UNIT/ML ~~LOC~~ SOLN
0.0000 [IU] | SUBCUTANEOUS | Status: DC
Start: 2017-08-16 — End: 2017-08-16

## 2017-08-16 MED ORDER — GLIMEPIRIDE 2 MG PO TABS
2.0000 mg | ORAL_TABLET | Freq: Once | ORAL | Status: DC
  Filled 2017-08-16: qty 1

## 2017-08-16 MED ORDER — GLIMEPIRIDE 4 MG PO TABS
4.0000 mg | ORAL_TABLET | Freq: Every day | ORAL | Status: DC
  Administered 2017-08-17 – 2017-08-18 (×2): 4 mg via ORAL
  Filled 2017-08-16 (×3): qty 1

## 2017-08-16 MED ORDER — GLIMEPIRIDE 2 MG PO TABS
2.0000 mg | ORAL_TABLET | Freq: Every day | ORAL | Status: AC
  Administered 2017-08-16: 2 mg via ORAL
  Filled 2017-08-16: qty 1

## 2017-08-16 NOTE — Progress Notes (Addendum)
Inpatient Diabetes Program Recommendations  AACE/ADA: New Consensus Statement on Inpatient Glycemic Control (2015)  Target Ranges:  Prepandial:   less than 140 mg/dL      Peak postprandial:   less than 180 mg/dL (1-2 hours)      Critically ill patients:  140 - 180 mg/dL   Results for Jacqueline Goodman, Jacqueline Goodman (MRN [id number]) as of 08/16/2017 12:35  Ref. Range 08/15/2017 07:38 08/15/2017 12:14 08/15/2017 14:01 08/15/2017 16:54 08/15/2017 20:25  Glucose-Capillary Latest Ref Range: 65 - 99 mg/dL 109 (H) 153 (H) 143 (H) 112 (H) 126 (H)   Results for Jacqueline Goodman, Jacqueline Goodman (MRN [id number]) as of 08/16/2017 14:08  Ref. Range 08/16/2017 07:44 08/16/2017 11:51  Glucose-Capillary Latest Ref Range: 65 - 99 mg/dL 198 (H) 195 (H)    Results for Jacqueline Goodman, Jacqueline Goodman (MRN [id number]) as of 08/16/2017 12:35  Ref. Range 06/05/2017 10:22  Hemoglobin A1C Unknown 6.5    Admit with: Pneumonia/ Hypoglycemia  History: DM  Home DM Meds: Amaryl 4 mg daily  Current Insulin Orders: Amaryl 4 mg daily (to start tomorrow 12/22)      Amaryl 2 mg daily (for today 12/21)      Novolog Sensitive Correction Scale/ SSI (0-9 units) TID AC + HS         MD- Please consider the following in-hospital insulin adjustments to possibly achieve tighter glycemic control in-hospital:  1. Start low dose basal insulin: Lantus 10 units daily (0.1 units/kg dosing)  2. Increase Novolog SSI to Moderate scale (0-15 units) TID AC+ HS    MD- Note patient having Hypoglycemia at home likely due to poor oral intake and Sulfonylurea medication (Amaryl) at home.  May consider stopping Amaryl for home use and trying patient on DPP-4 inhibitor medication like Tradjenta 5 mg daily.  DPP-4 inhibitor medications have lower risk for Hypoglycemia since they work in a glucose dependent manner.     --Will follow patient during hospitalization--  Wyn Quaker RN, MSN, CDE Diabetes Coordinator Inpatient Glycemic Control Team Team Pager:  [phone] (8a-5p)

## 2017-08-16 NOTE — Consult Note (Signed)
[place] Nurse wound consult note Reason for Consult: requested to assist plastic surgeon at bedside for wound care. Patient with recent plastic surgery in outpatient office on 07/22/17, after which time wound developed fatty necrosis. Plastic surgeon took patient back to inpatient OR for debridement 08/15/17. Plastic surgery has been directing wound care and assisting with dressing changes since admission and postoperatively.  Wound care is now ordered 3x per day. Wound type: non healing surgical wound Pressure Injury POA: NA Measurement: 36cm x 9.5cm x 5cm x 12.5-14cm undermining proximally along fascial plane and to the left lateral side of the incision Wound bed: clean, fascia intact, subcutaneous tissue pink, retention sutures in place, some yellow slough that encases the umbilicus which is free floating in the incision currently.  Drainage (amount, consistency, odor) moderate, serosanguinous, no odor Periwound: intact  Dressing procedure/placement/frequency: Plastic surgery removed 4 rolls of kerlix from the abdominal wound upon my arrival to the room. Bedside nurse Wendi along with plastic surgeon replaced dressing. Plastic surgeon giving instructions to bedside nurse on how to pack appropriately.    Request better glycemic control, WOC nurse updated Diabetes management consultation with requested parameters per plastic surgery  Request PT evaluation because patient reports unsteady when getting up and moving around.   Discussed with plastic surgeon use of gauze dressings TID vs use of NPWT. Would need complex NPWT dressings with white foam and black foam.  Plastic surgeon may want to bring patient back to OR for wash out and placement of NPWT dressing initially.  [place] nurse discussed logistics of the needed dressing changes with the upcoming holidays.    [place] nurse will follow up on Monday am to determine if surgeon has decided on next course of action for dressing changes.  Will need complex  dressing changes for extended period of time, unclear DC setting that can manage.  Will request CM to review, if needed NPWT dressings at home.   [place], [place], [place]

## 2017-08-16 NOTE — Progress Notes (Signed)
Family Medicine Teaching Service Daily Progress Note Intern Pager: [phone]  Patient name: Jacqueline Goodman Medical record number: [id number] Date of birth: [birth year] Age: 66 y.o. Gender: female  Primary Care Provider: Lind Covert, MD Consultants: Wound Care Code Status: Full  Pt Overview and Major Events to Date:  Jacqueline Goodman is a 66 y.o. female presenting with hypoglycemia and generalized weakness found to have pneumonia. PMH is significant for T2DM, HTN, HLD, s/p tummy tuck 07/22/2017.  Assessment and Plan:  Poor wound healing s/p tummy tuck: S/p I&D 12/20 -Blood cx with NGx5 days -Plastic surgeon is following who performed operation -Percocet 5-325mg  q4 prn pain -Started vanc (12/15-), and Zosyn (12/15-), will deescalate 12/22 to oral antibiotics -MRSA/Staph cultures from wound negative  T2DM: Last CBG 198. Goal 135-150 after surgery. Last hemoglobin A1c of 6.5 in October 2018. -We continue home glimepiride -will stop metformin due to side effects -SSI and CBGs at meals and bedtimes  Insomnia: Stable.  -Will hold home benadryl and suggest discontinuing this prior to discharge -Given ramelteon for sleep   HTN: Normotensive overnight. Last BP 148/58. -Losartan 50mg  -Monitor  Hypoalbuminemia: Albumin 1.8 on admission.  -Could be adding to decreased wound healing. -Nutrition consulted  Hyponatremia: Stable. NA 132.  -Will monitor on BMP  Nausea: Stable.  -Compazine for nausea  FEN/GI: Carb modified diet Prophylaxis: SCDs/ heparin  Disposition: pending medical stability  Subjective:  Patient is only willing to go home on her previous medications and plans to follow up closely with [place]. She is planning to eat. She is planning to check her sugars multiple times a day. Educated on her diet.    Objective: Temp:  [97.2 F (36.2 C)-98.1 F (36.7 C)] 98.1 F (36.7 C) (12/21 0940) Pulse Rate:  [78-84] 78 (12/21 0940) Resp:  [13-22] 16 (12/21  0940) BP: (116-148)/(42-58) 129/56 (12/21 0940) SpO2:  [96 %-100 %] 100 % (12/21 0940) Physical Exam: General: NAD, sitting up in chair Cardiovascular: RRR, no mrg Respiratory: CTABL, no crackles, rhonchi, or wheezing Abdomen: abdominal binder on midsection Extremities: moves all 4 extremities equally  Laboratory: Recent Labs  Lab 08/14/17 0519 08/15/17 0630 08/16/17 0718  WBC 11.9* 10.5 10.5  HGB 9.0* 8.5* 8.3*  HCT 28.0* 25.6* 25.2*  PLT 558* 460* 456*   Recent Labs  Lab 08/10/17 0118 08/11/17 0637  08/14/17 0519 08/15/17 0630 08/16/17 0718  NA 128* 129*   < > 132* 132* 130*  K 4.3 4.2   < > 4.4 4.2 4.4  CL 98* 99*   < > 98* 101 100*  CO2 20* 22   < > 26 26 25   BUN 24* 27*   < > 14 12 12   CREATININE 0.74 0.92   < > 0.71 0.67 0.64  CALCIUM 8.3* 7.8*   < > 8.3* 8.0* 7.9*  PROT 6.8 5.6*  --   --   --   --   BILITOT 0.4 0.4  --   --   --   --   ALKPHOS 161* 125  --   --   --   --   ALT 67* 52  --   --   --   --   AST 80* 44*  --   --   --   --   GLUCOSE 75 107*   < > 144* 136* 179*   < > = values in this interval not displayed.    Imaging/Diagnostic Tests: No results found.  Meranda Dechaine, [name], DO 08/16/2017,  11:03 AM PGY-1, [place] Intern pager: [phone], text pages welcome

## 2017-08-17 LAB — COMPREHENSIVE METABOLIC PANEL
ALT: 22 U/L (ref 14–54)
AST: 19 U/L (ref 15–41)
Albumin: 1.8 g/dL — ABNORMAL LOW (ref 3.5–5.0)
Alkaline Phosphatase: 106 U/L (ref 38–126)
Anion gap: 6 (ref 5–15)
BUN: 15 mg/dL (ref 6–20)
CO2: 26 mmol/L (ref 22–32)
Calcium: 7.7 mg/dL — ABNORMAL LOW (ref 8.9–10.3)
Chloride: 100 mmol/L — ABNORMAL LOW (ref 101–111)
Creatinine, Ser: 0.77 mg/dL (ref 0.44–1.00)
GFR calc Af Amer: >60 mL/min (ref >60)
GFR calc non Af Amer: >60 mL/min (ref >60)
Glucose, Bld: 154 mg/dL — ABNORMAL HIGH (ref 65–99)
Potassium: 4.4 mmol/L (ref 3.5–5.1)
Sodium: 132 mmol/L — ABNORMAL LOW (ref 135–145)
Total Bilirubin: 0.4 mg/dL (ref 0.3–1.2)
Total Protein: 5.9 g/dL — ABNORMAL LOW (ref 6.5–8.1)

## 2017-08-17 LAB — CBC
HCT: 26.9 % — ABNORMAL LOW (ref 36.0–46.0)
Hemoglobin: 8.6 g/dL — ABNORMAL LOW (ref 12.0–15.0)
MCH: 27.3 pg (ref 26.0–34.0)
MCHC: 32 g/dL (ref 30.0–36.0)
MCV: 85.4 fL (ref 78.0–100.0)
Platelets: 449 10*3/uL — ABNORMAL HIGH (ref 150–400)
RBC: 3.15 MIL/uL — ABNORMAL LOW (ref 3.87–5.11)
RDW: 13.7 % (ref 11.5–15.5)
WBC: 11.2 10*3/uL — ABNORMAL HIGH (ref 4.0–10.5)

## 2017-08-17 LAB — GLUCOSE, CAPILLARY
Glucose-Capillary: 86 mg/dL (ref 65–99)
Glucose-Capillary: 102 mg/dL — ABNORMAL HIGH (ref 65–99)
Glucose-Capillary: 130 mg/dL — ABNORMAL HIGH (ref 65–99)
Glucose-Capillary: 78 mg/dL (ref 65–99)

## 2017-08-17 MED ORDER — CLINDAMYCIN HCL 300 MG PO CAPS
600.0000 mg | ORAL_CAPSULE | Freq: Four times a day (QID) | ORAL | Status: DC
  Administered 2017-08-17 – 2017-08-18 (×5): 600 mg via ORAL
  Filled 2017-08-17 (×5): qty 2

## 2017-08-17 NOTE — Progress Notes (Signed)
Family Medicine Teaching Service Daily Progress Note Intern Pager: [phone]  Patient name: Jacqueline Goodman Medical record number: [id number] Date of birth: 1951/04/29 Age: 66 y.o. Gender: female  Primary Care Provider: Lind Covert, MD Consultants: Wound Care Code Status: Full  Pt Overview and Major Events to Date:  Jacqueline Goodman is a 66 y.o. female presenting with hypoglycemia and generalized weakness found to have pneumonia. PMH is significant for T2DM, HTN, HLD, s/p tummy tuck 07/22/2017.  Assessment and Plan:  Poor wound healing s/p tummy tuck: S/p I&D 12/20 -Blood cx with NGx5 days -Plastic surgeon is following who performed operation -Percocet 5-325mg  q4 prn pain -Discontinued vanc (12/15-22), and Zosyn (12/15-22), will start Clindamycin for antibiotic coverage s/p I&D- spoke with pharmacy on recommendations for antibiotic coverage. Recommending giving 600 mg q6 hr for 72 hours to cover for any necrosis. Would help to guide antibiotic decisions if we knew whether she was having another I&D.  -MRSA/Staph cultures from wound negative, aerobic/anaerobic pending  T2DM: Well controlled. Last CBG 102. Goal 135-150 after surgery. Last hemoglobin A1c of 6.5 in October 2018. -We continue home glimepiride -SSI and CBGs q6hr for better control  Insomnia: Stable.  -Will hold home benadryl and suggest discontinuing this prior to discharge -Giving ramelteon for sleep   HTN: Normotensive overnight. Last BP 135/65. -Losartan 50mg  -Monitor  Hypoalbuminemia: Stable. Albumin 1.8 on admission and 12/22.  -Could be adding to decreased wound healing. -Nutrition consulted  Hyponatremia: Worsening. NA 130.  -Will monitor on BMP -Consider adding fluids if this does not improve  Nausea: Stable.  -Compazine for nausea  FEN/GI: Carb modified diet Prophylaxis: SCDs/ heparin  Disposition: pending plastic surgery recommendations  Subjective:  Patient is very upset that she has  been told she may not get to leave before Christmas due to plastic surgeon recommendations. She is very confuse with the plan. She does not know if she is having to have another surgery or if she is having something done in the room that could be outpatient. The daughter is also in the room and confused with the plan. She is very motivated to control her sugars and go home. I am unable to help them understand the plan as there is no documentation.   Objective: Temp:  [97.7 F (36.5 C)-98.2 F (36.8 C)] 97.8 F (36.6 C) (12/22 1030) Pulse Rate:  [67-85] 85 (12/22 1030) Resp:  [18] 18 (12/22 1030) BP: (107-135)/(45-65) 135/65 (12/22 1030) SpO2:  [98 %-100 %] 99 % (12/22 1030) Physical Exam: General: NAD, laying in bed Cardiovascular: RRR, no mrg Respiratory: CTABL, no crackles, rhonchi, or wheezing Abdomen: abdominal binder on midsection Extremities: moves all 4 extremities equally  Laboratory: Recent Labs  Lab 08/15/17 0630 08/16/17 0718 08/17/17 1026  WBC 10.5 10.5 11.2*  HGB 8.5* 8.3* 8.6*  HCT 25.6* 25.2* 26.9*  PLT 460* 456* 449*   Recent Labs  Lab 08/11/17 0637  08/15/17 0630 08/16/17 0718 08/17/17 1026  NA 129*   < > 132* 130* 132*  K 4.2   < > 4.2 4.4 4.4  CL 99*   < > 101 100* 100*  CO2 22   < > 26 25 26   BUN 27*   < > 12 12 15   CREATININE 0.92   < > 0.67 0.64 0.77  CALCIUM 7.8*   < > 8.0* 7.9* 7.7*  PROT 5.6*  --   --   --  5.9*  BILITOT 0.4  --   --   --  0.4  ALKPHOS 125  --   --   --  106  ALT 52  --   --   --  22  AST 44*  --   --   --  19  GLUCOSE 107*   < > 136* 179* 154*   < > = values in this interval not displayed.    Imaging/Diagnostic Tests: No results found.  Jacqueline Goodman, [name], DO 08/17/2017, 1:09 PM PGY-1, Jacqueline Goodman Intern pager: [phone], text pages welcome

## 2017-08-17 NOTE — Progress Notes (Signed)
FPTS Interim Progress Note  Spoke with plastic surgeon, Dr. Hetty Blend on the phone regarding patient care recommendations. She stated that she would recommend keeping vanc and zosyn on for 24 hours after her surgery before switching to oral. She then recommended consulting ID for further antibiotic recommendations for the wound. She mentioned that patient was placed on vanc and zosyn for her pneumonia, but these antibiotics were placed for the infection of her surgical wound. We will switch her to oral antibiotic on 12/22 per plastic surgery recommendations.   [name], [name], DO 08/16/2017, 12:53 PM PGY-1, [place] Medicine Service pager [phone]

## 2017-08-17 NOTE — Care Management Note (Addendum)
Case Management Note  Patient Details  Name: Jacqueline Goodman MRN: [id number] Date of Birth: December 07, 1950  Subjective/Objective:  Poor wound healing s/p tummy tuck, DM                  Action/Plan: Discharge Planning: 08/16/2017 Spoke to pt, husband and dtr at bedside. Offered choice for HH/list provided. Pt requested [facility] for HH. Placed [facility] wound vac application on unit chart for attending to complete. Pt is requesting a RW for home. Will need HHRN orders/F2F with instruction on dressing changes.  Contacted [facility] with new referral.   PCP CHAMBLISS, MARSHALL L   Expected Discharge Date:  08/15/17               Expected Discharge Plan:  [name]  In-House Referral:  NA  Discharge planning Services  CM Consult  Post Acute Care Choice:  Home Health Choice offered to:  Patient, Adult Children  DME Arranged:    DME Agency:     HH Arranged:  RN [place] Agency:  [facility]  Status of Service:  In process, will continue to follow  If discussed at Long Length of Stay Meetings, dates discussed:    Additional Comments:  Erenest Rasher, RN 08/17/2017, 10:35 AM

## 2017-08-17 NOTE — Progress Notes (Signed)
Physical Therapy Treatment Patient Details Name: Jacqueline Goodman MRN: [id number] DOB: 05/03/1951 Today's Date: 08/17/2017    History of Present Illness Jacqueline Goodman is a 66 y.o. female presenting with hypoglycemia and generalized weakness found to have pneumonia. Recent tummy tuck incision with drain infected and dehiscence.  PMH is significant for T2DM, HTN, HLD, s/p tummy tuck 07/22/2017. Pt now s/p I&D on 12/20.    PT Comments    Pt seen for mobility progression following I&D of abdominal wound on 12/20. Pt requiring more assistance this session with ambulation as she did not have the IV pole for support. Pt refusing to use an AD as she has a hx of falls with RW and is fearful of falling. Pt would continue to benefit from skilled physical therapy services at this time while admitted and after d/c to address the below listed limitations in order to improve overall safety and independence with functional mobility.    Follow Up Recommendations  Home health PT;Supervision/Assistance - 24 hour     Equipment Recommendations  None recommended by PT(pt refusing to use a RW)    Recommendations for Other Services       Precautions / Restrictions Precautions Precautions: Fall Precaution Comments: does not want to use RW due to fall x2 with RW; abdominal wound with compression garment on  Restrictions Weight Bearing Restrictions: No    Mobility  Bed Mobility Overal bed mobility: Needs Assistance Bed Mobility: Rolling;Sidelying to Sit;Sit to Sidelying Rolling: Supervision Sidelying to sit: Min guard     Sit to sidelying: Mod assist General bed mobility comments: increased time and effort, assist to bring bilateral LEs back onto bed  Transfers Overall transfer level: Needs assistance Equipment used: None Transfers: Sit to/from Stand Sit to Stand: Supervision         General transfer comment: increased time to perform, no physical assist  requried  Ambulation/Gait Ambulation/Gait assistance: Min assist Ambulation Distance (Feet): 150 Feet Assistive device: 1 person hand held assist Gait Pattern/deviations: Step-through pattern;Decreased stride length Gait velocity: decreased Gait velocity interpretation: Below normal speed for age/gender General Gait Details: modest instability requiring constant min A with 1HHA and pt reaching for support surfaces with other UE. pt refusing to use an AD   Stairs            Wheelchair Mobility    Modified Rankin (Stroke Patients Only)       Balance Overall balance assessment: Needs assistance Sitting-balance support: No upper extremity supported;Feet supported Sitting balance-Leahy Scale: Good     Standing balance support: During functional activity Standing balance-Leahy Scale: Fair Standing balance comment: able to stand static without UE support                            Cognition Arousal/Alertness: Awake/alert Behavior During Therapy: WFL for tasks assessed/performed Overall Cognitive Status: Within Functional Limits for tasks assessed                                        Exercises      General Comments        Pertinent Vitals/Pain Pain Assessment: No/denies pain    Home Living                      Prior Function  PT Goals (current goals can now be found in the care plan section) Acute Rehab PT Goals PT Goal Formulation: With patient Time For Goal Achievement: 08/25/17 Potential to Achieve Goals: Good Progress towards PT goals: Progressing toward goals    Frequency    Min 3X/week      PT Plan Current plan remains appropriate    Co-evaluation              AM-PAC PT "6 Clicks" Daily Activity  Outcome Measure  Difficulty turning over in bed (including adjusting bedclothes, sheets and blankets)?: A Little Difficulty moving from lying on back to sitting on the side of the bed? : A  Little Difficulty sitting down on and standing up from a chair with arms (e.g., wheelchair, bedside commode, etc,.)?: A Little Help needed moving to and from a bed to chair (including a wheelchair)?: A Little Help needed walking in hospital room?: A Little Help needed climbing 3-5 steps with a railing? : A Lot 6 Click Score: 17    End of Session   Activity Tolerance: Patient tolerated treatment well Patient left: in bed;with call bell/phone within reach;with family/visitor present Nurse Communication: Mobility status PT Visit Diagnosis: Muscle weakness (generalized) (M62.81);Unsteadiness on feet (R26.81)     Time: 0092-3300 PT Time Calculation (min) (ACUTE ONLY): 20 min  Charges:  $Gait Training: 8-22 mins                    G Codes:       [place], [place], [place] 08/17/2017, 2:49 PM

## 2017-08-18 LAB — GLUCOSE, CAPILLARY
Glucose-Capillary: 73 mg/dL (ref 65–99)
Glucose-Capillary: 74 mg/dL (ref 65–99)
Glucose-Capillary: 75 mg/dL (ref 65–99)
Glucose-Capillary: 96 mg/dL (ref 65–99)

## 2017-08-18 MED ORDER — CLINDAMYCIN HCL 300 MG PO CAPS
300.0000 mg | ORAL_CAPSULE | Freq: Four times a day (QID) | ORAL | Status: DC
  Administered 2017-08-18 – 2017-08-21 (×13): 300 mg via ORAL
  Filled 2017-08-18 (×13): qty 1

## 2017-08-18 NOTE — Progress Notes (Signed)
Family Medicine Teaching Service Daily Progress Note Intern Pager: [phone]  Patient name: Jacqueline Goodman [facility] Medical record number: [id number] Date of birth: 1951-05-08 Age: 66 y.o. Gender: female  Primary Care Provider: Lind Covert, MD Consultants: Wound Care Code Status: Full  Pt Overview and Major Events to Date:  Jacqueline Goodman is a 66 y.o. female presenting with hypoglycemia and generalized weakness found to have pneumonia. PMH is significant for T2DM, HTN, HLD, s/p tummy tuck 07/22/2017.  Assessment and Plan:  Poor wound healing s/p tummy tuck: S/p I&D 12/20 -Blood cx with NGx5 days -Plastic surgeon is following who performed operation -Percocet 5-325mg  q4 prn pain -Discontinued vanc (12/15-22), and Zosyn (12/15-22), started Clindamycin for antibiotic coverage s/p I&D- spoke with pharmacy on recommendations for antibiotic coverage. Recommending giving 600 mg q6 hr for 72 hours to cover for any necrosis. Would help to guide antibiotic decisions if we knew whether she was having another I&D.  -MRSA/Staph cultures from wound negative, aerobic/anaerobic pending  T2DM: Well controlled. Last CBG 96. Goal 135-150 after surgery. Last hemoglobin A1c of 6.5 in October 2018. -We continue home glimepiride -SSI and CBGs q6hr for better control  Insomnia: Stable.  -Will hold home benadryl and suggest discontinuing this prior to discharge -Giving ramelteon for sleep   HTN: Normotensive overnight. Last BP 132/53. -Losartan 50mg  -Monitor  Hypoalbuminemia: Stable. Albumin 1.8 on admission and 12/22.  -Could be adding to decreased wound healing. -Nutrition consulted  Hyponatremia: improving 132 on 12/22pm -Will monitor on BMP -Consider adding fluids if this does not improve  Nausea: Stable.  -Compazine for nausea  FEN/GI: Carb modified diet Prophylaxis: SCDs/ heparin  Disposition: pending plastic surgery recommendations, attempted to reach on office number and epic  message sent to clarify plan  Subjective:  Patient feeling pain is improving.  She believes "pneumonia is gone" and she has been happy with her respiration today.   Patient and RN state the plan was to go to OR on Monday.  Objective: Temp:  [97.7 F (36.5 C)-98.3 F (36.8 C)] 97.7 F (36.5 C) (12/23 0347) Pulse Rate:  [80-86] 82 (12/23 0347) Resp:  [18-20] 20 (12/23 0347) BP: (135-161)/(56-70) 143/59 (12/23 0347) SpO2:  [98 %-100 %] 98 % (12/23 0347) Weight:  [228 lb 2.8 oz (103.5 kg)] 228 lb 2.8 oz (103.5 kg) (12/22 2032) Physical Exam: General: NAD, laying in bed Cardiovascular: RRR, no mrg Respiratory: CTABL, no crackles, rhonchi, or wheezing Abdomen: abdominal binder on midsection Extremities: moves all 4 extremities equally  Laboratory: Recent Labs  Lab 08/15/17 0630 08/16/17 0718 08/17/17 1026  WBC 10.5 10.5 11.2*  HGB 8.5* 8.3* 8.6*  HCT 25.6* 25.2* 26.9*  PLT 460* 456* 449*   Recent Labs  Lab 08/15/17 0630 08/16/17 0718 08/17/17 1026  NA 132* 130* 132*  K 4.2 4.4 4.4  CL 101 100* 100*  CO2 26 25 26   BUN 12 12 15   CREATININE 0.67 0.64 0.77  CALCIUM 8.0* 7.9* 7.7*  PROT  --   --  5.9*  BILITOT  --   --  0.4  ALKPHOS  --   --  106  ALT  --   --  22  AST  --   --  19  GLUCOSE 136* 179* 154*    Imaging/Diagnostic Tests: No results found.  Sherene Sires, DO 08/18/2017, 8:35 AM PGY-1, [place] Intern pager: [phone], text pages welcome

## 2017-08-18 NOTE — Progress Notes (Signed)
Ms. Jacqueline Goodman has done well post-operatively and is recovering without any complications. Her abdominal wound is clean with a good bed of granulation tissue and without any signs of infection. I think that we should be able to place a VAC dressing tomorrow with the wound care team- I had discussed this possibility with Jacqueline Goodman on Friday and she felt it would be appropriate. If the wound care team would like for me to be available for the [facility] placement, I will be available after 12-1:00 pm. The nursing staff understand this is the plan and will try to arrange for the [facility] placement tomorrow. Using the wound VAC will likely allow the quickest healing of the wound for the patient. The dressing needs to be changed tonight but since the [facility] will be placed tomorrow, if it will be done in the morning then the morning dressing change will not need to be performed. I did obtain cultures in the OR and should be followed up by tomorrow. The patient's blood glucose levels appear to be coming under better control. The optimal range should be less than 120 for good wound healing but absolutely less than 150. We need this for her wound to continue to heal well. As for discharge, I think that with wound VAC placement tomorrow and good glucose control in general, then at some point this coming week after Christmas would be achievable. However Jacqueline Goodman and the family will need to feel comfortable with changing the VAC dressing. We will provide appropriate follow-up in our office but will need very close monitoring of the patient's diabetes and medical conditions by the primary care team to help her heal well and quickly. Thanks for your help in general with Ms. Jacqueline Goodman.

## 2017-08-19 DIAGNOSIS — E669 Obesity, unspecified: Secondary | ICD-10-CM

## 2017-08-19 DIAGNOSIS — E1169 Type 2 diabetes mellitus with other specified complication: Secondary | ICD-10-CM

## 2017-08-19 LAB — GLUCOSE, CAPILLARY
Glucose-Capillary: 107 mg/dL — ABNORMAL HIGH (ref 65–99)
Glucose-Capillary: 157 mg/dL — ABNORMAL HIGH (ref 65–99)
Glucose-Capillary: 169 mg/dL — ABNORMAL HIGH (ref 65–99)
Glucose-Capillary: 201 mg/dL — ABNORMAL HIGH (ref 65–99)
Glucose-Capillary: 49 mg/dL — ABNORMAL LOW (ref 65–99)
Glucose-Capillary: 66 mg/dL (ref 65–99)
Glucose-Capillary: 80 mg/dL (ref 65–99)

## 2017-08-19 LAB — AEROBIC/ANAEROBIC CULTURE (SURGICAL/DEEP WOUND)

## 2017-08-19 LAB — AEROBIC/ANAEROBIC CULTURE W GRAM STAIN (SURGICAL/DEEP WOUND): Culture: NORMAL

## 2017-08-19 MED ORDER — PRO-STAT SUGAR FREE PO LIQD: 30.0000 mL | Freq: Three times a day (TID) | ORAL | 0 refills | Status: DC

## 2017-08-19 MED ORDER — LOSARTAN POTASSIUM 25 MG PO TABS: 75.0000 mg | ORAL_TABLET | Freq: Every day | ORAL | 0 refills | Status: DC

## 2017-08-19 MED ORDER — LOSARTAN POTASSIUM 50 MG PO TABS: 50.0000 mg | ORAL_TABLET | Freq: Every day | ORAL | 0 refills | Status: DC

## 2017-08-19 MED ORDER — PRO-STAT SUGAR FREE PO LIQD
30.0000 mL | Freq: Three times a day (TID) | ORAL | Status: DC
  Administered 2017-08-19 – 2017-08-21 (×7): 30 mL via ORAL
  Filled 2017-08-19 (×7): qty 30

## 2017-08-19 MED ORDER — GLUCERNA SHAKE PO LIQD: 237.0000 mL | Freq: Three times a day (TID) | ORAL | 0 refills | Status: AC

## 2017-08-19 MED ORDER — GLIMEPIRIDE 2 MG PO TABS: 2.0000 mg | ORAL_TABLET | Freq: Every day | ORAL | 3 refills | Status: DC

## 2017-08-19 MED ORDER — GLUCOSE 40 % PO GEL
ORAL | Status: AC
  Administered 2017-08-19: 37.5 g
  Filled 2017-08-19: qty 1

## 2017-08-19 MED ORDER — RAMELTEON 8 MG PO TABS: 8.0000 mg | ORAL_TABLET | Freq: Every day | ORAL | 0 refills | Status: DC

## 2017-08-19 MED ORDER — GLIMEPIRIDE 2 MG PO TABS
2.0000 mg | ORAL_TABLET | Freq: Every day | ORAL | Status: DC
  Filled 2017-08-19: qty 1

## 2017-08-19 MED ORDER — POLYETHYLENE GLYCOL 3350 17 G PO PACK: 17.0000 g | PACK | Freq: Every day | ORAL | 0 refills | Status: DC | PRN

## 2017-08-19 MED ORDER — CLINDAMYCIN HCL 300 MG PO CAPS: 300.0000 mg | ORAL_CAPSULE | Freq: Four times a day (QID) | ORAL | 0 refills | Status: AC

## 2017-08-19 NOTE — Progress Notes (Signed)
Family Medicine Teaching Service Daily Progress Note Intern Pager: [phone]  Patient name: Adelaida Reindel [place] Medical record number: [id number] Date of birth: 11-Jun-1951 Age: 66 y.o. Gender: female  Primary Care Provider: Lind Covert, MD Consultants: Wound Care Code Status: Full  Pt Overview and Major Events to Date:  12/15- admitted to [place] 12/20- wound revision in OR  Assessment and Plan: BREA COLESON is a 66 y.o. female presenting with hypoglycemia and generalized weakness. PMH is significant for T2DM, HTN, HLD, s/p tummy tuck 07/22/2017.  Poor wound healing s/p tummy tuck: S/p I&D 12/20 -Blood cx with NGx5 days -Plastic surgery following, plan for wound vac application today and d/c home after -Percocet 5-325mg  q4 prn pain -continue clindamycin 300 mg qid for 10 days total -MRSA/Staph cultures from wound negative, aerobic/anaerobic pending  T2DM: Well controlled. Goal <150.  -decreased amaryl to 2mg  daily due to hypoglycemic event overnight -SSI and CBGs q6hr for better control  Insomnia: Stable.  -Will hold home benadryl and suggest discontinuing this prior to discharge -Giving ramelteon for sleep   HTN: Normotensive overnight. Last BP 132/53. -Losartan 50mg  -Monitor  Hypoalbuminemia: Stable. Albumin 1.8 on admission and 12/22.  -Could be adding to decreased wound healing. -Nutrition consulted  Hyponatremia: improving 132 on 12/22pm -Will monitor on BMP -Consider adding fluids if this does not improve  Nausea: Stable.  -Compazine for nausea  FEN/GI: Carb modified diet Prophylaxis: SCDs/ heparin  Disposition: pending wound vac placement, home 12/24 likely  Subjective:  Wants to go home. Denying pain. Awaiting wound vac.  Objective: Temp:  [97.8 F (36.6 C)-98.6 F (37 C)] 98.5 F (36.9 C) (12/24 0759) Pulse Rate:  [72-81] 81 (12/24 0759) Resp:  [18-20] 18 (12/24 0759) BP: (118-151)/(37-56) 126/48 (12/24 0759) SpO2:  [96 %-100 %] 96 %  (12/24 0759) Weight:  [227 lb 11.8 oz (103.3 kg)] 227 lb 11.8 oz (103.3 kg) (12/23 2225) Physical Exam: General: NAD, laying in bed Cardiovascular: RRR, no mrg Respiratory: CTABL, no crackles, rhonchi, or wheezing Abdomen: abdominal binder on midsection Extremities: moves all 4 extremities equally Neuro: no focal deficits  Laboratory: Recent Labs  Lab 08/15/17 0630 08/16/17 0718 08/17/17 1026  WBC 10.5 10.5 11.2*  HGB 8.5* 8.3* 8.6*  HCT 25.6* 25.2* 26.9*  PLT 460* 456* 449*   Recent Labs  Lab 08/15/17 0630 08/16/17 0718 08/17/17 1026  NA 132* 130* 132*  K 4.2 4.4 4.4  CL 101 100* 100*  CO2 26 25 26   BUN 12 12 15   CREATININE 0.67 0.64 0.77  CALCIUM 8.0* 7.9* 7.7*  PROT  --   --  5.9*  BILITOT  --   --  0.4  ALKPHOS  --   --  106  ALT  --   --  22  AST  --   --  19  GLUCOSE 136* 179* 154*   Imaging/Diagnostic Tests: No results found.  Steve Rattler, DO 08/19/2017, 8:30 AM PGY-2, [place] Intern pager: [phone], text pages welcome

## 2017-08-19 NOTE — Progress Notes (Signed)
Physical Therapy Treatment Patient Details Name: Jacqueline Goodman MRN: [id number] DOB: [birth year] Today's Date: 08/19/2017    History of Present Illness Raelle Chambers Hauge is a 66 y.o. female presenting with hypoglycemia and generalized weakness found to have pneumonia. Recent tummy tuck incision with drain infected and dehiscence.  PMH is significant for T2DM, HTN, HLD, s/p tummy tuck 07/22/2017. Pt now s/p I&D on 12/20.    PT Comments    Patient seen for mobility progression. Tolerated increased activity/distance today. Spent time educating patient regarding mobility expectations for discharge. Anticipate patient will be safe for d/c home with family and HHPT. Will continue to see as indicated.   Follow Up Recommendations  Home health PT;Supervision/Assistance - 24 hour     Equipment Recommendations  None recommended by PT(pt refusing to use a RW)    Recommendations for Other Services       Precautions / Restrictions Precautions Precautions: Fall Restrictions Weight Bearing Restrictions: No    Mobility  Bed Mobility               General bed mobility comments: received in chair  Transfers Overall transfer level: Needs assistance Equipment used: None Transfers: Sit to/from Stand Sit to Stand: Supervision         General transfer comment: increased time to perform, no physical assist requried  Ambulation/Gait Ambulation/Gait assistance: Min guard;Min assist Ambulation Distance (Feet): 180 Feet Assistive device: 1 person hand held assist Gait Pattern/deviations: Step-through pattern;Decreased stride length Gait velocity: decreased Gait velocity interpretation: Below normal speed for age/gender General Gait Details: modest instability noted but overall moving well. Patient using therapist arm as a tactile cue for stability. Patient with increased distance this session(VCs for increased gait speed)   Stairs            Wheelchair Mobility    Modified  [place] (Stroke Patients Only)       Balance Overall balance assessment: Needs assistance Sitting-balance support: No upper extremity supported;Feet supported Sitting balance-Leahy Scale: Good     Standing balance support: During functional activity Standing balance-Leahy Scale: Fair                              Cognition Arousal/Alertness: Awake/alert Behavior During Therapy: WFL for tasks assessed/performed Overall Cognitive Status: Within Functional Limits for tasks assessed                                        Exercises      General Comments General comments (skin integrity, edema, etc.): educated patient extensively regarding mobility expectations, safety with mobility, and activities to avoid at this time       Pertinent Vitals/Pain Pain Assessment: 0-10 Pain Score: 6  Pain Location: abdomen Pain Descriptors / Indicators: Sore Pain Intervention(s): Monitored during session    Home Living                      Prior Function            PT Goals (current goals can now be found in the care plan section) Acute Rehab PT Goals Patient Stated Goal: to go home PT Goal Formulation: With patient Time For Goal Achievement: 08/25/17 Potential to Achieve Goals: Good Progress towards PT goals: Progressing toward goals    Frequency    Min 3X/week  PT Plan Current plan remains appropriate    Co-evaluation              AM-PAC PT "6 Clicks" Daily Activity  Outcome Measure  Difficulty turning over in bed (including adjusting bedclothes, sheets and blankets)?: A Little Difficulty moving from lying on back to sitting on the side of the bed? : A Little Difficulty sitting down on and standing up from a chair with arms (e.g., wheelchair, bedside commode, etc,.)?: A Little Help needed moving to and from a bed to chair (including a wheelchair)?: A Little Help needed walking in hospital room?: A Little Help needed climbing  3-5 steps with a railing? : A Little 6 Click Score: 18    End of Session Equipment Utilized During Treatment: (did not use gait belt dur to abdominal incision) Activity Tolerance: Patient tolerated treatment well Patient left: in chair;with call bell/phone within reach;with family/visitor present Nurse Communication: Mobility status PT Visit Diagnosis: Muscle weakness (generalized) (M62.81);Unsteadiness on feet (R26.81)     Time:  -     Charges:  $Gait Training: 8-22 mins                    G Codes:       Alben Deeds, PT DPT  Board Certified Neurologic Specialist [place] 08/19/2017, 12:22 PM

## 2017-08-19 NOTE — Consult Note (Signed)
[place] Nurse wound follow up Wound type: surgical, post op abdominoplasty as outpatient.   Measurement: 9.5cm x 40cm x 4cm  Undermining:11.5cm left lateral wound edge Undermining: 13 cm right upper quadrant Undermining: 11 cm right lateral wound edge Undermining: 4 cm proximal to umbilicus Wound EEF:EOFHQ fascial wall, some skin necrosis noted at 1 o clock   Patient states "plastic surgeon said I might loose some skin there"  Drainage (amount, consistency, odor) moderate serosanguinous, no odor  Periwound: intact  Dressing procedure/placement/frequency: [place] nurse discussed placement of NPWT dressing with plastic surgeon on Friday.  Today placed white foam along over fascia, 6 pieces cut to fit in undermined areas and cover fascia completely. Additional 8 pieces of black foam used to fill the remainder of the wound bed, both black and white foam have been weaved under retention sutures  Circular pieces of black foam used around patient umbilicus.  Sealed at 124mmHG, patient tolerated quite well.  Patient will require complex NPWT dressing changes, making sure to label dressings with the number of pieces of white foam and the number of pieces of black foam.  HHRN must be experienced in placement of complex VAC dressings. And make sure to not place stress on retention sutures.   CM aware of needs for NPWT at home. Attending to follow up with plastic surgery on needed follow up after DC.   [place] nurse team will follow along with you for complex NPWT dressing changes.  [place], [place], [place]

## 2017-08-19 NOTE — Care Management Important Message (Signed)
Important Message  Patient Details  Name: Jacqueline Goodman MRN: [id number] Date of Birth: 1951-03-29   Medicare Important Message Given:  Yes    Nathen May 08/19/2017, 9:41 AM

## 2017-08-19 NOTE — Progress Notes (Signed)
Occupational Therapy Treatment and Discharge Patient Details Name: Jacqueline Goodman MRN: [id number] DOB: 1951-01-03 Today's Date: 08/19/2017    History of present illness Jacqueline Goodman is a 66 y.o. female presenting with hypoglycemia and generalized weakness found to have pneumonia. Recent tummy tuck incision with drain infected and dehiscence.  PMH is significant for T2DM, HTN, HLD, s/p tummy tuck 07/22/2017. Pt now s/p I&D on 12/20.   OT comments  This 66 yo female admitted with above presents to acute OT with all education completed from an acute care standpoint--no further acute OT needs, but pt would benefit from [facility] to address any issues with more independence in that environment. We will D/C from acute OT.  Follow Up Recommendations  Home health OT;Supervision/Assistance - 24 hour    Equipment Recommendations  None recommended by OT       Precautions / Restrictions Precautions Precautions: Fall Precaution Comments: does not want to use RW due to fall x2 with RW; abdominal wound with wound vac as of today (08/19/2017) Restrictions Weight Bearing Restrictions: No       Mobility Bed Mobility Overal bed mobility: Needs Assistance Bed Mobility: Sit to Supine       Sit to supine: Min assist(for LLE)    Transfers Overall transfer level: Needs assistance Equipment used: None Transfers: Sit to/from Stand Sit to Stand: Supervision         General transfer comment: increased time to perform, no physical assist requried    Balance Overall balance assessment: Needs assistance Sitting-balance support: No upper extremity supported;Feet supported Sitting balance-Leahy Scale: Good     Standing balance support: During functional activity Standing balance-Leahy Scale: Fair Standing balance comment: able to stand static without UE support; holds onto surface or reaches for hand with mobility (does not want to use RW due to feels using one made her fall 2 times in past)                            ADL either performed or assessed with clinical judgement   ADL Overall ADL's : Needs assistance/impaired                         Toilet Transfer: Min guard;Ambulation;BSC Toilet Transfer Details (indicate cue type and reason): over toliet; min guard A due to fall risk and does not want to use RW (reports one made her fall x2) Toileting- Clothing Manipulation and Hygiene:S Sit to/from stand               Vision Patient Visual Report: No change from baseline            Cognition Arousal/Alertness: Awake/alert Behavior During Therapy: WFL for tasks assessed/performed Overall Cognitive Status: Within Functional Limits for tasks assessed                                                     Pertinent Vitals/ Pain       Pain Assessment: No/denies pain Pain Score: 6  Pain Location: abdomen Pain Descriptors / Indicators: Sore Pain Intervention(s): Monitored during session            Progress Toward Goals  OT Goals(current goals can now be found in the care plan section)  Progress towards OT goals: (all education  completed)  Acute Rehab OT Goals Patient Stated Goal: to go home  Plan (all education completed, pt has A from husband and dtr prn at home and AE education has been completed)       AM-PAC PT "6 Clicks" Daily Activity     Outcome Measure   Help from another person eating meals?: None Help from another person taking care of personal grooming?: A Little Help from another person toileting, which includes using toliet, bedpan, or urinal?: A Little Help from another person bathing (including washing, rinsing, drying)?: A Little Help from another person to put on and taking off regular upper body clothing?: A Little Help from another person to put on and taking off regular lower body clothing?: A Lot(does better with AE and increased time) 6 Click Score: 18    End of Session    OT Visit  Diagnosis: Unsteadiness on feet (R26.81);Muscle weakness (generalized) (M62.81)   Activity Tolerance Patient tolerated treatment well   Patient Left in bed(with wound care nurse applying wound vac)   Nurse Communication          Time: 2035-5974 OT Time Calculation (min): 22 min  Charges: OT General Charges $OT Visit: 1 Visit OT Treatments $Self Care/Home Management : 8-22 mins  [place], OTR/L [phone] 08/19/2017

## 2017-08-19 NOTE — Progress Notes (Signed)
Nutrition Follow-up  DOCUMENTATION CODES:   Severe malnutrition in context of acute illness/injury  INTERVENTION:   -Continue Glucerna Shake po TID, each supplement provides 220 kcal and 10 grams of protein  -increase Pro-Stat 30 mL to TID  -Snacks TID between meals; encouraged smaller, more frequent meals to help percent hypoglycemic episodes. Reinforced importance of eating carbohydrates as well as protein at each meals  -Continue MVI   NUTRITION DIAGNOSIS:   Severe Malnutrition related to acute illness as evidenced by percent weight loss, moderate muscle depletion, moderate fat depletion.  Being addressed via supplements, snacks  GOAL:   Patient will meet greater than or equal to 90% of their needs  Progressing  MONITOR:   PO intake, Supplement acceptance, Skin, Labs, Weight trends  REASON FOR ASSESSMENT:   Malnutrition Screening Tool    ASSESSMENT:   66 yo female admitted with hypoglycemia (in setting of sulfonylureas with decreased appetite and acute infection) and generalized weakness found to have pneumonia. Pt also with recent tummy tuck (07/22/17) with poor wound healing which has dehisced with purulent draiange Pt with hx of DM, HTN, HLD  12/20 Irrigation and debridement of fatty necrosis of lower abdominal skin fla[ 12/24 Planning wound VAC placement today   Pt continues with hypoglycemic episodes. Noted diabetes coordinator recommending discontinuing Amaryl  Appetite remains poor; pt ate bacon and bites of eggs this AM. Upon visit, pt had not consumed any carbohydrate source. Pt had pears on meal tray, strongly encouraged pt to consume a carbohydrate source at each meal and with each snack. Reviewed sources of carbohydrates. Explained to pt that while protein intake is important for wound healing, carbohydrate intake is necessary to prevent hypoglycemic episodes and good glucose management is imperative for wound healing as well.   Pt verbalizes she loves  the Glucerna shakes and is drinking TID. Pt also taking Pro-Stat  Labs: CBGs 49-157 Meds: amaryl, MVI  Diet Order:  Diet Carb Modified Fluid consistency: Thin; Room service appropriate? Yes  EDUCATION NEEDS:   Education needs have been addressed  Skin:  Skin Assessment: Skin Integrity Issues: Skin Integrity Issues:: Other (Comment) Other: recent tummy tuck that has dehisced with purulent drainage  Last BM:  12/17  Height:   Ht Readings from Last 1 Encounters:  08/10/17 5\' 9"  (1.753 m)    Weight:   Wt Readings from Last 1 Encounters:  08/18/17 227 lb 11.8 oz (103.3 kg)    Ideal Body Weight:     BMI:  Body mass index is 33.63 kg/m.  Estimated Nutritional Needs:   Kcal:  2000-2200  Protein:  125-140 g  Fluid:  >/= 2 L  Kerman Passey MS, RD, LDN, CNSC [phone] Pager  [phone] Weekend/On-Call Pager

## 2017-08-19 NOTE — Progress Notes (Signed)
Inpatient Diabetes Program Recommendations  AACE/ADA: New Consensus Statement on Inpatient Glycemic Control (2015)  Target Ranges:  Prepandial:   less than 140 mg/dL      Peak postprandial:   less than 180 mg/dL (1-2 hours)      Critically ill patients:  140 - 180 mg/dL   Results for Jacqueline Goodman, Jacqueline Goodman (MRN [id number]) as of 08/19/2017 09:32  Ref. Range 08/18/2017 00:18 08/18/2017 07:14 08/18/2017 12:03 08/18/2017 17:14 08/19/2017 00:50 08/19/2017 01:17 08/19/2017 01:59 08/19/2017 06:31  Glucose-Capillary Latest Ref Range: 65 - 99 mg/dL 75 74 96 73 49 (L) 66 107 (H) 80   Review of Glycemic Control  Diabetes history: DM2 Outpatient Diabetes medications: Amaryl 4 mg QAM Current orders for Inpatient glycemic control: Amaryl 4 mg QAM, Novolog 0-9 units Q6H  Inpatient Diabetes Program Recommendations:  Oral Agents: Please consider discontinuing Amaryl while inpatient.   Outpatient DM medications: With patient experiencing hypoglycemia as an inpatient on Amaryl, MD may want to consider stopping Amaryl as an outpatient and trying patient on DPP-4 inhibitor medication like Tradjenta 5 mg daily. DPP-4 inhibitor medications have lower risk for Hypoglycemia since they work in a glucose dependent manner.    Thanks, Barnie Alderman, RN, MSN, CDE Diabetes Coordinator Inpatient Diabetes Program [phone] (Team Pager from 8am to 5pm)

## 2017-08-19 NOTE — Care Management Note (Addendum)
Case Management Note  Patient Details  Name: ARACELY RICKETT MRN: [id number] Date of Birth: 1951-05-18  Subjective/Objective:     Presented with hypoglycemia and generalized weakness found to have pneumonia. PMH is significant forT2DM,HTN, HLD,s/p tummy tuck 07/22/2017.      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 and Home health services. [facility] will bring and exchange home vac @ bedside pending insurance approval  prior to discharge to home. Questions please call Ezra Sites ([place])  @ [phone]. Fax [phone]. Wound vac application, demographics, H&P, and op note were all faxed for authorization for home vac per CM.   Expected Discharge Date:                 Expected Discharge Plan:  [place]  In-House Referral:  NA  Discharge planning Services  CM Consult  Post Acute Care Choice:  Home Health Choice offered to:  Patient, Adult Children  DME Arranged:  Vac DME Agency:  [facility]   HH Arranged:  RN,PT [place] Agency:  [place]  Status of Service:  COMPLETED  If discussed at Long Length of Stay Meetings, dates discussed:    Additional Comments:  Sharin Mons, RN 08/19/2017, 1:15 PM

## 2017-08-19 NOTE — Progress Notes (Signed)
PT Cancellation Note  Patient Details Name: Jacqueline Goodman MRN: [id number] DOB: July 27, 1951   Cancelled Treatment:    Reason Eval/Treat Not Completed: Other (comment) patient just sitting up for breakfast at this time, stated she had a late night. Will follow up later this date.   Duncan Dull 08/19/2017, 10:31 AM Alben Deeds, PT DPT  Board Certified Neurologic Specialist [phone]

## 2017-08-20 DIAGNOSIS — E43 Unspecified severe protein-calorie malnutrition: Secondary | ICD-10-CM

## 2017-08-20 LAB — GLUCOSE, CAPILLARY
Glucose-Capillary: 177 mg/dL — ABNORMAL HIGH (ref 65–99)
Glucose-Capillary: 132 mg/dL — ABNORMAL HIGH (ref 65–99)
Glucose-Capillary: 86 mg/dL (ref 65–99)
Glucose-Capillary: 173 mg/dL — ABNORMAL HIGH (ref 65–99)

## 2017-08-20 MED ORDER — GLIMEPIRIDE 4 MG PO TABS
4.0000 mg | ORAL_TABLET | Freq: Every day | ORAL | Status: DC
  Administered 2017-08-20 – 2017-08-21 (×2): 4 mg via ORAL
  Filled 2017-08-20 (×2): qty 1

## 2017-08-20 NOTE — Progress Notes (Signed)
Family Medicine Teaching Service Daily Progress Note Intern Pager: [phone]  Patient name: Jacqueline Goodman Medical record number: [id number] Date of birth: [birth year] Age: 66 y.o. Gender: female  Primary Care Provider: Lind Covert, MD Consultants: Wound Care Code Status: Full  Pt Overview and Major Events to Date:  12/15- admitted to [facility] 12/20- wound revision in OR 12/24- wound vac placed   Assessment and Plan: Jacqueline Goodman is a 66 y.o. female presenting with hypoglycemia and generalized weakness. PMH is significant for T2DM, HTN, HLD, s/p tummy tuck 07/22/2017.  Poor wound healing s/p tummy tuck: S/p I&D 12/20, wound vac applied 12/24 -Plastic surgery following -wound care following for dressing changes -Percocet 5-325mg  q4 prn pain -continue clindamycin 300 mg qid for 10 days total -MRSA/Staph cultures from wound negative, aerobic/anaerobic pending  T2DM: Well controlled. Goal CBGs <150.  -continue amaryl 4mg  daily -SSI and CBGs q6hr for better control  Difficulty sleeping: Stable.  -continue ramelteon for sleep   HTN: Normotensive overnight. Last BP 127/72 -continue Losartan 50mg  -Monitor BP   Hypoalbuminemia: Stable. Albumin 1.8 on admission and 12/22.  -Could be adding to impaired wound healing. -Nutrition consulted  Hyponatremia: improving 132 on 12/22pm -Will monitor on BMP   Nausea: Stable.  -Compazine for nausea  FEN/GI: Carb modified diet Prophylaxis: SCDs/ heparin  Disposition: home 12/26  Subjective:  Doing well. Has some discomfort with vac on. No other complaints.  Objective: Temp:  [97.8 F (36.6 C)-99.5 F (37.5 C)] 99.5 F (37.5 C) (12/24 2123) Pulse Rate:  [72-96] 96 (12/24 2123) Resp:  [16-19] 16 (12/24 2123) BP: (118-143)/(37-52) 143/48 (12/24 2123) SpO2:  [96 %-100 %] 100 % (12/24 2123) Physical Exam: General: sitting up in bedside chair, in NAD Cardiovascular: RRR, no mrg Respiratory: CTABL, no crackles, rhonchi,  or wheezing Abdomen: wound vac applied to lower abdomen  Extremities: moves all 4 extremities equally, no edema or cyanosis Neuro: no focal deficits  Laboratory: Recent Labs  Lab 08/15/17 0630 08/16/17 0718 08/17/17 1026  WBC 10.5 10.5 11.2*  HGB 8.5* 8.3* 8.6*  HCT 25.6* 25.2* 26.9*  PLT 460* 456* 449*   Recent Labs  Lab 08/15/17 0630 08/16/17 0718 08/17/17 1026  NA 132* 130* 132*  K 4.2 4.4 4.4  CL 101 100* 100*  CO2 26 25 26   BUN 12 12 15   CREATININE 0.67 0.64 0.77  CALCIUM 8.0* 7.9* 7.7*  PROT  --   --  5.9*  BILITOT  --   --  0.4  ALKPHOS  --   --  106  ALT  --   --  22  AST  --   --  19  GLUCOSE 136* 179* 154*   Imaging/Diagnostic Tests: No results found.  Steve Rattler, DO 08/20/2017, 6:08 AM PGY-2, [place] Intern pager: [phone], text pages welcome

## 2017-08-21 ENCOUNTER — Ambulatory Visit: Payer: Medicare Other | Admitting: Family Medicine

## 2017-08-21 LAB — GLUCOSE, CAPILLARY
Glucose-Capillary: 93 mg/dL (ref 65–99)
Glucose-Capillary: 128 mg/dL — ABNORMAL HIGH (ref 65–99)
Glucose-Capillary: 130 mg/dL — ABNORMAL HIGH (ref 65–99)

## 2017-08-21 MED ORDER — OXYCODONE-ACETAMINOPHEN 5-325 MG PO TABS: 1.0000 | ORAL_TABLET | ORAL | 0 refills | Status: AC | PRN

## 2017-08-21 NOTE — Anesthesia Postprocedure Evaluation (Signed)
Anesthesia Post Note  Patient: Jacqueline Goodman  Procedure(s) Performed: Ananias Pilgrim AND DEBRIDEMENT OF ABDOMINAL WOUND (N/A Abdomen)     Patient location during evaluation: PACU Anesthesia Type: General Level of consciousness: awake and alert Pain management: pain level controlled Vital Signs Assessment: post-procedure vital signs reviewed and stable Respiratory status: spontaneous breathing, nonlabored ventilation, respiratory function stable and patient connected to nasal cannula oxygen Cardiovascular status: blood pressure returned to baseline and stable Postop Assessment: no apparent nausea or vomiting Anesthetic complications: no    Last Vitals:  Vitals:   08/20/17 1726 08/20/17 2020  BP: (!) 136/52 (!) 146/97  Pulse: 80 96  Resp: 16 18  Temp: 36.9 C 36.9 C  SpO2: 100% 100%    Last Pain:  Vitals:   08/20/17 2111  TempSrc:   PainSc: Asleep                 Shalay Carder S

## 2017-08-21 NOTE — Consult Note (Addendum)
[place] nurse follow-up: Vac dressing change; refer to previous consult not on Monday for assessment and measurements. Full thickness post-op wound; pt is followed by the Plastics team and is planning to discharge with home health to change the complex dressings Q M/W/F. Drainage (amount, consistency, odor) moderate serosanguinous drainage in the cannister (400 cc), no odor, new cannister applied.  Periwound: Left and right previous surgical site to outer abd (not vac site) have slough, each site is approx .2X1cm with small amt yellow drainage, no fluctuance. Dressing procedure/placement/frequency: Removed all previous piecs of white foam (9) and black foam (8) and replaced with 5 pieces of white foam and 5 pieces of black foam to fit in undermined areas and cover fascia completely and have been weaved under retention sutures  Circular pieces of black foam used around patient umbilicus,  and placed white foam over the top of that site also .  Sealed at 15mmHG, patient tolerated with minimal amt discomfort after meds were given earlier. Abd binder re-applied over the incision site and foam dressings applied to outer abd sites. Patient will require complex NPWT dressing changes, making sure to label dressings with the number of pieces of white foam and the number of pieces of black foam.  HHRN must be experienced in placement of complex VAC dressings. And make sure to not place stress on retention sutures.  CM aware of needs for NPWT at home. Attending to follow up with plastic surgery on needed follow up after DC.  Please re-consult if further assistance is needed.  Thank-you,  Julien Girt MSN, [place], [place], [place], [place]

## 2017-08-21 NOTE — Progress Notes (Addendum)
Spoke w Ezra Sites ([place])  @ [phone], who states that insurance Josem Kaufmann is still pending. It is unclear if Josem Kaufmann will be approved due to the original surgery being an elective self pay procedure. Patient may have to sign ABN for VAC.  [facility] 3rd party [place], [phone]. Spoke w Liaison John at [phone] who stated that [facility] did not receive referral from [facility]. CM provided [employer] with FAX number [phone] to fax order and clinical attn STAT to be reviewed. If not approved, or no determination,  ABN for VAC would need to come from [facility]. CM will follow.  Anticipate DC today.  10:55 Faxed referral to [facility], fax conformation received.  15:10- Received approval, from [facility] with purchase ID number [id number]. Notified [employer] and LM with [facility] to expedite delivery of VAC to hospital today.  Notified Dr Criss Rosales of approval. MD stated he will DC tonight after VAC delivered to hospital.  Notified [place] after VAC delivered to hospital.  16:00 Notified by [employer] that Vac anticipated delivery is by 5:00, RN, family and attending updated.

## 2017-08-21 NOTE — Progress Notes (Signed)
Patient Discharge: Disposition: Patient discharged to home with wound vac and home health. Education:  Reviewed medications, prescriptions, follow-up appointments and discharge instructions, verbalized understanding. IV: Discontinued IV before discharge, tolerated good. Telemetry: N/A Transportation: Patient escorted out of the unit in w/c accompanied by the daughter with all the patient belongings and also the wound vac supplies.  Changed the wound vac to portable canister before discharge.

## 2017-08-21 NOTE — Progress Notes (Signed)
Physical Therapy Treatment Patient Details Name: Jacqueline Goodman MRN: [id number] DOB: 11/08/1950 Today's Date: 08/21/2017    History of Present Illness Jacqueline Goodman is a 66 y.o. female presenting with hypoglycemia and generalized weakness found to have pneumonia. Recent tummy tuck incision with drain infected and dehiscence.  PMH is significant for T2DM, HTN, HLD, s/p tummy tuck 07/22/2017. Pt now s/p I&D on 12/20. Wound Vac placed 12/24    PT Comments    Patient initially reluctant but then agreeable to mobility. Performed bed mobility, functional task and ambulation today. Continues to require education regarding importance of mobility and safety with activity. Current POC remains appropriate.   Follow Up Recommendations  Home health PT;Supervision/Assistance - 24 hour     Equipment Recommendations  None recommended by PT(pt refusing to use a RW)    Recommendations for Other Services       Precautions / Restrictions Precautions Precautions: Fall Restrictions Weight Bearing Restrictions: No    Mobility  Bed Mobility Overal bed mobility: Needs Assistance Bed Mobility: Rolling;Supine to Sit Rolling: Supervision   Supine to sit: Min assist     General bed mobility comments: Min assist to pull to sit at EOB, increased time to perform  Transfers Overall transfer level: Needs assistance Equipment used: None Transfers: Sit to/from Stand Sit to Stand: Supervision         General transfer comment: increased time to perform, no physical assist requried (performed from [facility] and bed)  Ambulation/Gait Ambulation/Gait assistance: Min guard;Min assist Ambulation Distance (Feet): 200 Feet Assistive device: 1 person hand held assist Gait Pattern/deviations: Step-through pattern;Decreased stride length Gait velocity: decreased   General Gait Details: Continues to require increased VCs for upright posture and increased cadence with mobility   Stairs             Wheelchair Mobility    Modified Rankin (Stroke Patients Only)       Balance Overall balance assessment: Needs assistance Sitting-balance support: No upper extremity supported;Feet supported Sitting balance-Leahy Scale: Good     Standing balance support: During functional activity Standing balance-Leahy Scale: Fair                              Cognition Arousal/Alertness: Awake/alert Behavior During Therapy: WFL for tasks assessed/performed Overall Cognitive Status: Within Functional Limits for tasks assessed                                        Exercises      General Comments        Pertinent Vitals/Pain Pain Assessment: No/denies pain    Home Living                      Prior Function            PT Goals (current goals can now be found in the care plan section) Acute Rehab PT Goals Patient Stated Goal: to go home PT Goal Formulation: With patient Time For Goal Achievement: 08/25/17 Potential to Achieve Goals: Good Progress towards PT goals: Progressing toward goals    Frequency    Min 3X/week      PT Plan Current plan remains appropriate    Co-evaluation              AM-PAC PT "6 Clicks" Daily Activity  Outcome Measure  Difficulty  turning over in bed (including adjusting bedclothes, sheets and blankets)?: A Little Difficulty moving from lying on back to sitting on the side of the bed? : A Little Difficulty sitting down on and standing up from a chair with arms (e.g., wheelchair, bedside commode, etc,.)?: A Little Help needed moving to and from a bed to chair (including a wheelchair)?: A Little Help needed walking in hospital room?: A Little Help needed climbing 3-5 steps with a railing? : A Little 6 Click Score: 18    End of Session Equipment Utilized During Treatment: (did not use gait belt due to abdominal incision) Activity Tolerance: Patient tolerated treatment well Patient left: in  chair;with call bell/phone within reach;with family/visitor present Nurse Communication: Mobility status PT Visit Diagnosis: Muscle weakness (generalized) (M62.81);Unsteadiness on feet (R26.81)     Time: 6269-4854 PT Time Calculation (min) (ACUTE ONLY): 19 min  Charges:  $Therapeutic Exercise: 8-22 mins                    G Codes:       Alben Deeds, PT DPT  Board Certified Neurologic Specialist [place] 08/21/2017, 3:37 PM

## 2017-08-21 NOTE — Progress Notes (Signed)
Family Medicine Teaching Service Daily Progress Note Intern Pager: [phone]  Patient name: Jacqueline Goodman Kenneth Medical record number: [id number] Date of birth: 1951/03/03 Age: 66 y.o. Gender: female  Primary Care Provider: Lind Covert, MD Consultants: Wound Care Code Status: Full  Pt Overview and Major Events to Date:  12/15- admitted to [facility] 12/20- wound revision in OR 12/24- wound vac placed   Assessment and Plan: Jacqueline Goodman is a 66 y.o. female presenting with hypoglycemia and generalized weakness. PMH is significant for T2DM, HTN, HLD, s/p tummy tuck 07/22/2017.  Poor wound healing s/p tummy tuck: S/p I&D 12/20, wound vac applied 12/24 -Plastic surgery following -wound care following for dressing changes -Percocet 5-325mg  q4 prn pain -continue clindamycin 300 mg qid for 10 days total -MRSA/Staph cultures from wound negative, aerobic/anaerobic pending  T2DM: Well controlled. Goal CBGs <150.  -continue amaryl 4mg  daily -SSI and CBGs q6hr for better control  Difficulty sleeping: Stable.  -continue ramelteon for sleep   HTN: Normotensive overnight. Last BP 127/72 -continue Losartan 50mg  -Monitor BP   Hypoalbuminemia: Stable. Albumin 1.8 on admission and 12/22.  -Could be adding to impaired wound healing. -Nutrition consulted  Hyponatremia: improving 132 on 12/22pm -Will monitor on BMP   Nausea: Stable.  -Compazine for nausea  FEN/GI: Carb modified diet Prophylaxis: SCDs/ heparin  Disposition: home 12/26 once wound vac has arrived  Subjective:  Patient is feeling their pain has improved, they are comfortable with home health wound care and seeing [facility] with close followup.   They have yet to get a confirmed appt with plastics but know that is supposed to be done and they will call and arrange appt.  Objective: Temp:  [98.3 F (36.8 C)-98.5 F (36.9 C)] 98.4 F (36.9 C) (12/25 2020) Pulse Rate:  [71-96] 96 (12/25 2020) Resp:  [16-18] 18  (12/25 2020) BP: (107-146)/(52-97) 146/97 (12/25 2020) SpO2:  [99 %-100 %] 100 % (12/25 2020) Weight:  [227 lb (103 kg)] 227 lb (103 kg) (12/25 2020) Physical Exam: General: Was getting wound redressed when I went by Cardiovascular: RRR, no mrg Respiratory: CTABL, no crackles, rhonchi, or wheezing Abdomen: wound appears free of gross infection/drainage, being packed by wound nurse at time of rounds  Extremities: no deficits noted but not ambulating during wound dressing change Neuro: no focal deficits  Laboratory: Recent Labs  Lab 08/15/17 0630 08/16/17 0718 08/17/17 1026  WBC 10.5 10.5 11.2*  HGB 8.5* 8.3* 8.6*  HCT 25.6* 25.2* 26.9*  PLT 460* 456* 449*   Recent Labs  Lab 08/15/17 0630 08/16/17 0718 08/17/17 1026  NA 132* 130* 132*  K 4.2 4.4 4.4  CL 101 100* 100*  CO2 26 25 26   BUN 12 12 15   CREATININE 0.67 0.64 0.77  CALCIUM 8.0* 7.9* 7.7*  PROT  --   --  5.9*  BILITOT  --   --  0.4  ALKPHOS  --   --  106  ALT  --   --  22  AST  --   --  19  GLUCOSE 136* 179* 154*   Imaging/Diagnostic Tests: No results found.  Sherene Sires, DO 08/21/2017, 6:52 AM PGY-2, [place] Intern pager: [phone], text pages welcome

## 2017-08-22 DIAGNOSIS — L7682 Other postprocedural complications of skin and subcutaneous tissue: Secondary | ICD-10-CM | POA: Diagnosis not present

## 2017-08-22 DIAGNOSIS — I10 Essential (primary) hypertension: Secondary | ICD-10-CM | POA: Diagnosis not present

## 2017-08-22 DIAGNOSIS — E119 Type 2 diabetes mellitus without complications: Secondary | ICD-10-CM | POA: Diagnosis not present

## 2017-08-22 DIAGNOSIS — E785 Hyperlipidemia, unspecified: Secondary | ICD-10-CM | POA: Diagnosis not present

## 2017-08-22 DIAGNOSIS — E8809 Other disorders of plasma-protein metabolism, not elsewhere classified: Secondary | ICD-10-CM | POA: Diagnosis not present

## 2017-08-22 DIAGNOSIS — T8189XD Other complications of procedures, not elsewhere classified, subsequent encounter: Secondary | ICD-10-CM | POA: Diagnosis not present

## 2017-08-22 DIAGNOSIS — J189 Pneumonia, unspecified organism: Secondary | ICD-10-CM | POA: Diagnosis not present

## 2017-08-23 ENCOUNTER — Other Ambulatory Visit: Payer: Self-pay

## 2017-08-23 ENCOUNTER — Telehealth: Payer: Self-pay | Admitting: *Deleted

## 2017-08-23 ENCOUNTER — Ambulatory Visit (INDEPENDENT_AMBULATORY_CARE_PROVIDER_SITE_OTHER): Payer: Medicare Other | Admitting: Family Medicine

## 2017-08-23 ENCOUNTER — Encounter: Payer: Self-pay | Admitting: Family Medicine

## 2017-08-23 VITALS — BP 126/74 | HR 78 | Temp 98.4°F | Ht 69.0 in | Wt 227.0 lb

## 2017-08-23 DIAGNOSIS — E1169 Type 2 diabetes mellitus with other specified complication: Secondary | ICD-10-CM | POA: Diagnosis present

## 2017-08-23 DIAGNOSIS — E669 Obesity, unspecified: Secondary | ICD-10-CM

## 2017-08-23 DIAGNOSIS — T8130XA Disruption of wound, unspecified, initial encounter: Secondary | ICD-10-CM

## 2017-08-23 DIAGNOSIS — J189 Pneumonia, unspecified organism: Secondary | ICD-10-CM | POA: Diagnosis not present

## 2017-08-23 DIAGNOSIS — I10 Essential (primary) hypertension: Secondary | ICD-10-CM | POA: Diagnosis not present

## 2017-08-23 DIAGNOSIS — L7682 Other postprocedural complications of skin and subcutaneous tissue: Secondary | ICD-10-CM | POA: Diagnosis not present

## 2017-08-23 DIAGNOSIS — T8189XD Other complications of procedures, not elsewhere classified, subsequent encounter: Secondary | ICD-10-CM | POA: Diagnosis not present

## 2017-08-23 DIAGNOSIS — E119 Type 2 diabetes mellitus without complications: Secondary | ICD-10-CM | POA: Diagnosis not present

## 2017-08-23 DIAGNOSIS — E8809 Other disorders of plasma-protein metabolism, not elsewhere classified: Secondary | ICD-10-CM | POA: Diagnosis not present

## 2017-08-23 LAB — POCT GLYCOSYLATED HEMOGLOBIN (HGB A1C): Hemoglobin A1C: 6.1

## 2017-08-23 LAB — GLUCOSE, POCT (MANUAL RESULT ENTRY): POC Glucose: 140 mg/dl — AB (ref 70–99)

## 2017-08-23 NOTE — Progress Notes (Signed)
  HPI:  Jacqueline Goodman presents for hospital follow up. Patient was hospitalized from 12/15 to 12/26 with hypoglecemia in the setting of abdominal wound infection. Since discharge, patient reports that she has been doing well. She has been trying to keep her BG between 130-150 and has been successful except for one or two instances. Patient has followed up with wound care nurse and they will need authorization from PCP before they can come to her house for dressing change. Patient has an appointment today (12/28) 3:00 pm with plastic surgery to follow up on abdominal wound. Patient will start melatonin since Ramelteon was not approved by her insurance. Patient will continue with glimepiride until wound is healed.  Patient denies any chest pain , abdominal pain, shortness of breath, nausea, vomiting , fever or chills.  ROS: See HPI.  [place]:   PHYSICAL EXAM: BP 126/74   Pulse 78   Temp 98.4 F (36.9 C) (Oral)   Ht 5\' 9"  (1.753 m)   Wt 227 lb (103 kg)   SpO2 97%   BMI 33.52 kg/m  General:NAD, pleasant, able to participate in exam Cardiac: RRR, normal heart sounds, no murmurs. 2+ radial and PT pulses bilaterally Respiratory: CTAB, normal effort, No wheezes, rales or rhonchi Abdomen: Dressing in place, soft, nontender, nondistended, no hepatic or splenomegaly, +BS Extremities: no edema or cyanosis. WWP. Skin: warm and dry, no rashes noted Neuro: alert and oriented x4, no focal deficits Psych: Normal affect and mood  ASSESSMENT/PLAN:  #Abdominal wound, improving Patient will continue to keep BG within  Instructed range. A1c today is 6.1. She is following up with plastic surgeon today (12/28). Wound care verbal orders given by Dr. Ardelia Mems for [facility] nurse for home dressing changes M-W-F.  #Insomnia Patient interested in starting melatonin since ramelteon was declined by [facility].  --Start patient on melatonin 5 mg qhs --Follow up with PCP  #Hypoglycemia, well  controlled Currently on glimepiride 2 mg daily.patient know signs of hypoglycemia and measures that need to be taken in case of hypoglycemic event ( juice, etc)    [place] J. Ardelia Mems, [place]

## 2017-08-23 NOTE — Patient Instructions (Signed)
It was great seeing you today! We have addressed the following issues today  1. Continue to maintain your Blood glucose between 130-150. 2. Make sure you follow up with your plastic surgeon 3. You can take 5 mg of melatonin at night as needed 4. Complete antibiotic course 5. Follow up with PCP ([place]) on 08/29/2017  If we did any lab work today, and the results require attention, either me or my nurse will get in touch with you. If everything is normal, you will get a letter in mail and a message via . If you don't hear from [place] in two weeks, please give [place] a call. Otherwise, we look forward to seeing you again at your next visit. If you have any questions or concerns before then, please call the clinic at [phone].  Please bring all your medications to every doctors visit  Sign up for My Chart to have easy access to your labs results, and communication with your Primary care physician. Please ask Front Desk for some assistance.   Please check-out at the front desk before leaving the clinic.    Take Care,   Dr. Andy Gauss

## 2017-08-23 NOTE — Telephone Encounter (Signed)
Mary called and needed PT verbal orders for the following:  2 times a week for 4 weeks, then 1 time a week for 2 weeks  . Fleeger, Salome Spotted, CMA

## 2017-08-23 NOTE — Telephone Encounter (Signed)
Received message on nurse line from [place], [place] with [facility] requesting approval for plan of care for [name] wound care. Requesting 2 visits this week and 3 x per week for 9 weeks. Plan to go out today so need approval as soon as possible. Please call Rip [place] at [phone]. Hubbard Hartshorn, RN, BSN

## 2017-08-23 NOTE — Telephone Encounter (Signed)
Precepted this patient with Dr. Andy Gauss today, whom she saw for a hospital follow up visit.  I called and authorized these verbal orders.  Leeanne Rio, MD

## 2017-08-24 DIAGNOSIS — I10 Essential (primary) hypertension: Secondary | ICD-10-CM | POA: Diagnosis not present

## 2017-08-24 DIAGNOSIS — J189 Pneumonia, unspecified organism: Secondary | ICD-10-CM | POA: Diagnosis not present

## 2017-08-24 DIAGNOSIS — E8809 Other disorders of plasma-protein metabolism, not elsewhere classified: Secondary | ICD-10-CM | POA: Diagnosis not present

## 2017-08-24 DIAGNOSIS — E119 Type 2 diabetes mellitus without complications: Secondary | ICD-10-CM | POA: Diagnosis not present

## 2017-08-24 DIAGNOSIS — L7682 Other postprocedural complications of skin and subcutaneous tissue: Secondary | ICD-10-CM | POA: Diagnosis not present

## 2017-08-24 DIAGNOSIS — T8189XD Other complications of procedures, not elsewhere classified, subsequent encounter: Secondary | ICD-10-CM | POA: Diagnosis not present

## 2017-08-26 DIAGNOSIS — E8809 Other disorders of plasma-protein metabolism, not elsewhere classified: Secondary | ICD-10-CM | POA: Diagnosis not present

## 2017-08-26 DIAGNOSIS — T8189XD Other complications of procedures, not elsewhere classified, subsequent encounter: Secondary | ICD-10-CM | POA: Diagnosis not present

## 2017-08-26 DIAGNOSIS — L7682 Other postprocedural complications of skin and subcutaneous tissue: Secondary | ICD-10-CM | POA: Diagnosis not present

## 2017-08-26 DIAGNOSIS — I10 Essential (primary) hypertension: Secondary | ICD-10-CM | POA: Diagnosis not present

## 2017-08-26 DIAGNOSIS — J189 Pneumonia, unspecified organism: Secondary | ICD-10-CM | POA: Diagnosis not present

## 2017-08-26 DIAGNOSIS — E119 Type 2 diabetes mellitus without complications: Secondary | ICD-10-CM | POA: Diagnosis not present

## 2017-08-27 DIAGNOSIS — T8149XA Infection following a procedure, other surgical site, initial encounter: Secondary | ICD-10-CM

## 2017-08-27 HISTORY — DX: Infection following a procedure, other surgical site, initial encounter: T81.49XA

## 2017-08-28 ENCOUNTER — Ambulatory Visit (INDEPENDENT_AMBULATORY_CARE_PROVIDER_SITE_OTHER): Payer: Medicare Other | Admitting: Family Medicine

## 2017-08-28 ENCOUNTER — Encounter: Payer: Self-pay | Admitting: Family Medicine

## 2017-08-28 ENCOUNTER — Other Ambulatory Visit: Payer: Self-pay

## 2017-08-28 ENCOUNTER — Inpatient Hospital Stay: Admission: RE | Admit: 2017-08-28 | Payer: Medicare Other | Source: Ambulatory Visit

## 2017-08-28 DIAGNOSIS — J189 Pneumonia, unspecified organism: Secondary | ICD-10-CM | POA: Diagnosis not present

## 2017-08-28 DIAGNOSIS — L7682 Other postprocedural complications of skin and subcutaneous tissue: Secondary | ICD-10-CM | POA: Diagnosis not present

## 2017-08-28 DIAGNOSIS — I10 Essential (primary) hypertension: Secondary | ICD-10-CM | POA: Diagnosis not present

## 2017-08-28 DIAGNOSIS — E8809 Other disorders of plasma-protein metabolism, not elsewhere classified: Secondary | ICD-10-CM | POA: Diagnosis not present

## 2017-08-28 DIAGNOSIS — T8189XD Other complications of procedures, not elsewhere classified, subsequent encounter: Secondary | ICD-10-CM | POA: Diagnosis not present

## 2017-08-28 DIAGNOSIS — E119 Type 2 diabetes mellitus without complications: Secondary | ICD-10-CM | POA: Diagnosis not present

## 2017-08-28 DIAGNOSIS — R5383 Other fatigue: Secondary | ICD-10-CM | POA: Diagnosis not present

## 2017-08-28 NOTE — Patient Instructions (Addendum)
Good to see you today!  Thanks for coming in.  Watch the red areas on your Left leg - if worsening then call me  If you have any fevers or worsening lightheadness then call me  Take 1/2 benadryl 12.5 mg at night\  I will call you if your lab tests are not normal.  Otherwise we will discuss them at your next visit.  Come back in 1-2 weeks

## 2017-08-28 NOTE — Assessment & Plan Note (Signed)
After abdominal surgery.  Wide differential.  Does not appear to have systemic infection with normal vs and exam.  Unsure if focal red areas on leg are infection vs irritation.  Patient is on clindamycin.  Asked family to watch leg carefully.  Will check labs for anemia or renal liver disease.  Has follow up with surgery on Friday and will see me next week.

## 2017-08-28 NOTE — Progress Notes (Signed)
Subjective  Patient is presenting with the following illnesses  FATIGUE lightheadness For last week or so.  Seems to be getting worse after last visit here instead of better Feels very tired all time, lightheadness sometimes when stands No fever or shortness of breath or chest pain  Small red spots on left leg Her edema seems to be worse  Wound - no purulent discharge but one area is dark Taking benadryl 25 mg at night for sleep and congestion   Chief Complaint noted Review of Symptoms - see HPI PMH - Smoking status noted.     Objective Vital Signs reviewed Awake alert nad Sitting in wheelchair but able to stand and get on exam table with balance assist Left lower leg - two areas of focal redness - outlined with pen.  No fluctuance and minimally sore.  No calf tenderness Wearing compression stocking with minimal edema Lungs:  Normal respiratory effort, chest expands symmetrically. Lungs are clear to auscultation, no crackles or wheezes. Heart - Regular rate and rhythm.  No murmurs, gallops or rubs.    Abdomen - wound vac in place.  No areas of purulence or discharge and minimal redness.  One area in middle under clear dressing look somewhat dark Neck:  No deformities, thyromegaly, masses, or tenderness noted.   Supple with full range of motion without pain. Skin:  Intact without suspicious lesions or rashes     Assessments/Plans  Fatigue After abdominal surgery.  Wide differential.  Does not appear to have systemic infection with normal vs and exam.  Unsure if focal red areas on leg are infection vs irritation.  Patient is on clindamycin.  Asked family to watch leg carefully.  Will check labs for anemia or renal liver disease.  Has follow up with surgery on Friday and will see me next week.     See after visit summary for details of patient instuctions

## 2017-08-29 LAB — CMP14+EGFR
ALT: 20 IU/L (ref 0–32)
AST: 23 IU/L (ref 0–40)
Albumin/Globulin Ratio: 0.9 — ABNORMAL LOW (ref 1.2–2.2)
Albumin: 2.9 g/dL — ABNORMAL LOW (ref 3.6–4.8)
Alkaline Phosphatase: 92 IU/L (ref 39–117)
BUN/Creatinine Ratio: 24 (ref 12–28)
BUN: 20 mg/dL (ref 8–27)
Bilirubin Total: <0.2 mg/dL (ref 0.0–1.2)
CO2: 23 mmol/L (ref 20–29)
Calcium: 8.7 mg/dL (ref 8.7–10.3)
Chloride: 99 mmol/L (ref 96–106)
Creatinine, Ser: 0.82 mg/dL (ref 0.57–1.00)
GFR calc Af Amer: 86 mL/min/{1.73_m2} (ref >59)
GFR calc non Af Amer: 75 mL/min/{1.73_m2} (ref >59)
Globulin, Total: 3.2 g/dL (ref 1.5–4.5)
Glucose: 110 mg/dL — ABNORMAL HIGH (ref 65–99)
Potassium: 5.1 mmol/L (ref 3.5–5.2)
Sodium: 136 mmol/L (ref 134–144)
Total Protein: 6.1 g/dL (ref 6.0–8.5)

## 2017-08-29 LAB — CBC
Hematocrit: 27.1 % — ABNORMAL LOW (ref 34.0–46.6)
Hemoglobin: 8.7 g/dL — ABNORMAL LOW (ref 11.1–15.9)
MCH: 27.4 pg (ref 26.6–33.0)
MCHC: 32.1 g/dL (ref 31.5–35.7)
MCV: 85 fL (ref 79–97)
Platelets: 517 10*3/uL — ABNORMAL HIGH (ref 150–379)
RBC: 3.18 x10E6/uL — ABNORMAL LOW (ref 3.77–5.28)
RDW: 14.5 % (ref 12.3–15.4)
WBC: 11.5 10*3/uL — ABNORMAL HIGH (ref 3.4–10.8)

## 2017-08-30 ENCOUNTER — Other Ambulatory Visit: Payer: Self-pay

## 2017-08-30 ENCOUNTER — Encounter (HOSPITAL_COMMUNITY): Payer: Self-pay | Admitting: *Deleted

## 2017-08-30 ENCOUNTER — Ambulatory Visit: Payer: Self-pay | Admitting: Plastic Surgery

## 2017-08-30 DIAGNOSIS — J189 Pneumonia, unspecified organism: Secondary | ICD-10-CM | POA: Diagnosis not present

## 2017-08-30 DIAGNOSIS — I10 Essential (primary) hypertension: Secondary | ICD-10-CM | POA: Diagnosis not present

## 2017-08-30 DIAGNOSIS — T8189XD Other complications of procedures, not elsewhere classified, subsequent encounter: Secondary | ICD-10-CM | POA: Diagnosis not present

## 2017-08-30 DIAGNOSIS — E8809 Other disorders of plasma-protein metabolism, not elsewhere classified: Secondary | ICD-10-CM | POA: Diagnosis not present

## 2017-08-30 DIAGNOSIS — E119 Type 2 diabetes mellitus without complications: Secondary | ICD-10-CM | POA: Diagnosis not present

## 2017-08-30 DIAGNOSIS — L7682 Other postprocedural complications of skin and subcutaneous tissue: Secondary | ICD-10-CM | POA: Diagnosis not present

## 2017-08-30 NOTE — Progress Notes (Signed)
Spoke with pt's daughter, [name] with pt's permission for pre-op call. She states pt does not have a cardiac history. Denies any recent chest pain or sob. Pt is a type 2 diabetic. Last A1C ws 6.1 on 08/23/17. She states pt's fasting blood sugar is usually between 120-140. Dr. Concepcion Elk that pt have a light breakfast by 6 AM and then clear liquids until she leaves for the hospital. Instructed [name] to have pt check her blood sugar when she gets up Monday AM and every 2 hours until she leaves for the hospital. If blood sugar is 70 or below, treat with 1/2 cup of clear juice (apple or cranberry) and recheck blood sugar 15 minutes after drinking juice. If blood sugar continues to be 70 or below, call the Short Stay department and ask to speak to a nurse. [name] voiced understanding

## 2017-09-02 ENCOUNTER — Other Ambulatory Visit: Payer: Self-pay

## 2017-09-02 ENCOUNTER — Ambulatory Visit (HOSPITAL_COMMUNITY): Payer: Medicare Other | Admitting: Certified Registered Nurse Anesthetist

## 2017-09-02 ENCOUNTER — Ambulatory Visit (HOSPITAL_COMMUNITY)
Admission: RE | Admit: 2017-09-02 | Discharge: 2017-09-02 | Disposition: A | Discharge status: Home / Self Care | Payer: Medicare Other | Source: Ambulatory Visit | Attending: Plastic Surgery | Admitting: Plastic Surgery

## 2017-09-02 ENCOUNTER — Encounter (HOSPITAL_COMMUNITY): Payer: Self-pay | Admitting: Surgery

## 2017-09-02 ENCOUNTER — Encounter (HOSPITAL_COMMUNITY)
Admission: RE | Disposition: A | Discharge status: Home / Self Care | Payer: Self-pay | Source: Ambulatory Visit | Attending: Plastic Surgery

## 2017-09-02 DIAGNOSIS — E119 Type 2 diabetes mellitus without complications: Secondary | ICD-10-CM | POA: Insufficient documentation

## 2017-09-02 DIAGNOSIS — Z79899 Other long term (current) drug therapy: Secondary | ICD-10-CM | POA: Insufficient documentation

## 2017-09-02 DIAGNOSIS — T8131XA Disruption of external operation (surgical) wound, not elsewhere classified, initial encounter: Secondary | ICD-10-CM | POA: Insufficient documentation

## 2017-09-02 DIAGNOSIS — Z6832 Body mass index (BMI) 32.0-32.9, adult: Secondary | ICD-10-CM | POA: Diagnosis not present

## 2017-09-02 DIAGNOSIS — I10 Essential (primary) hypertension: Secondary | ICD-10-CM | POA: Diagnosis not present

## 2017-09-02 DIAGNOSIS — I96 Gangrene, not elsewhere classified: Secondary | ICD-10-CM | POA: Diagnosis not present

## 2017-09-02 DIAGNOSIS — Y838 Other surgical procedures as the cause of abnormal reaction of the patient, or of later complication, without mention of misadventure at the time of the procedure: Secondary | ICD-10-CM | POA: Insufficient documentation

## 2017-09-02 DIAGNOSIS — E785 Hyperlipidemia, unspecified: Secondary | ICD-10-CM | POA: Diagnosis not present

## 2017-09-02 HISTORY — DX: Other specified postprocedural states: R11.2

## 2017-09-02 HISTORY — PX: DEBRIDEMENT AND CLOSURE WOUND: SHX5614

## 2017-09-02 HISTORY — DX: Pneumonia, unspecified organism: J18.9

## 2017-09-02 HISTORY — DX: Nausea with vomiting, unspecified: Z98.890

## 2017-09-02 HISTORY — DX: Essential (primary) hypertension: I10

## 2017-09-02 LAB — BASIC METABOLIC PANEL
Anion gap: 7 (ref 5–15)
BUN: 23 mg/dL — ABNORMAL HIGH (ref 6–20)
CO2: 24 mmol/L (ref 22–32)
Calcium: 8.6 mg/dL — ABNORMAL LOW (ref 8.9–10.3)
Chloride: 101 mmol/L (ref 101–111)
Creatinine, Ser: 0.74 mg/dL (ref 0.44–1.00)
GFR calc Af Amer: >60 mL/min (ref >60)
GFR calc non Af Amer: >60 mL/min (ref >60)
Glucose, Bld: 149 mg/dL — ABNORMAL HIGH (ref 65–99)
Potassium: 4.6 mmol/L (ref 3.5–5.1)
Sodium: 132 mmol/L — ABNORMAL LOW (ref 135–145)

## 2017-09-02 LAB — GLUCOSE, CAPILLARY
Glucose-Capillary: 144 mg/dL — ABNORMAL HIGH (ref 65–99)
Glucose-Capillary: 125 mg/dL — ABNORMAL HIGH (ref 65–99)
Glucose-Capillary: 106 mg/dL — ABNORMAL HIGH (ref 65–99)
Glucose-Capillary: 92 mg/dL (ref 65–99)
Glucose-Capillary: 139 mg/dL — ABNORMAL HIGH (ref 65–99)

## 2017-09-02 LAB — HEMOGLOBIN: Hemoglobin: 9.1 g/dL — ABNORMAL LOW (ref 12.0–15.0)

## 2017-09-02 SURGERY — DEBRIDEMENT, WOUND, WITH CLOSURE
Anesthesia: Monitor Anesthesia Care

## 2017-09-02 MED ORDER — MIDAZOLAM HCL 2 MG/2ML IJ SOLN
INTRAMUSCULAR | Status: AC
  Filled 2017-09-02: qty 2

## 2017-09-02 MED ORDER — OXYCODONE HCL 5 MG PO TABS
ORAL_TABLET | ORAL | Status: AC
  Filled 2017-09-02: qty 1

## 2017-09-02 MED ORDER — PROPOFOL 10 MG/ML IV BOLUS
INTRAVENOUS | Status: AC
  Filled 2017-09-02: qty 20

## 2017-09-02 MED ORDER — CHLORHEXIDINE GLUCONATE CLOTH 2 % EX PADS: 6.0000 | MEDICATED_PAD | Freq: Once | CUTANEOUS | Status: DC

## 2017-09-02 MED ORDER — LIDOCAINE-EPINEPHRINE 1 %-1:100000 IJ SOLN
INTRAMUSCULAR | Status: AC
  Filled 2017-09-02: qty 1

## 2017-09-02 MED ORDER — DEXAMETHASONE SODIUM PHOSPHATE 10 MG/ML IJ SOLN
INTRAMUSCULAR | Status: DC | PRN
  Administered 2017-09-02: 10 mg via INTRAVENOUS

## 2017-09-02 MED ORDER — FENTANYL CITRATE (PF) 100 MCG/2ML IJ SOLN
25.0000 ug | INTRAMUSCULAR | Status: DC | PRN
  Administered 2017-09-02 (×2): 50 ug via INTRAVENOUS

## 2017-09-02 MED ORDER — PHENYLEPHRINE 40 MCG/ML (10ML) SYRINGE FOR IV PUSH (FOR BLOOD PRESSURE SUPPORT)
PREFILLED_SYRINGE | INTRAVENOUS | Status: AC
  Filled 2017-09-02: qty 10

## 2017-09-02 MED ORDER — OXYCODONE HCL 5 MG PO TABS
5.0000 mg | ORAL_TABLET | Freq: Once | ORAL | Status: AC | PRN
  Administered 2017-09-02: 5 mg via ORAL

## 2017-09-02 MED ORDER — LIDOCAINE-EPINEPHRINE (PF) 1 %-1:200000 IJ SOLN
INTRAMUSCULAR | Status: AC
  Filled 2017-09-02: qty 30

## 2017-09-02 MED ORDER — PROPOFOL 500 MG/50ML IV EMUL
INTRAVENOUS | Status: DC | PRN
  Administered 2017-09-02: 50 ug/kg/min via INTRAVENOUS

## 2017-09-02 MED ORDER — DEXAMETHASONE SODIUM PHOSPHATE 10 MG/ML IJ SOLN
INTRAMUSCULAR | Status: AC
  Filled 2017-09-02: qty 1

## 2017-09-02 MED ORDER — PROPOFOL 10 MG/ML IV BOLUS
INTRAVENOUS | Status: DC | PRN
  Administered 2017-09-02 (×4): 20 mg via INTRAVENOUS

## 2017-09-02 MED ORDER — FENTANYL CITRATE (PF) 250 MCG/5ML IJ SOLN
INTRAMUSCULAR | Status: AC
  Filled 2017-09-02: qty 5

## 2017-09-02 MED ORDER — 0.9 % SODIUM CHLORIDE (POUR BTL) OPTIME
TOPICAL | Status: DC | PRN
  Administered 2017-09-02: 1000 mL

## 2017-09-02 MED ORDER — ONDANSETRON HCL 4 MG/2ML IJ SOLN
INTRAMUSCULAR | Status: AC
  Filled 2017-09-02: qty 2

## 2017-09-02 MED ORDER — FENTANYL CITRATE (PF) 100 MCG/2ML IJ SOLN
INTRAMUSCULAR | Status: DC | PRN
  Administered 2017-09-02: 25 ug via INTRAVENOUS
  Administered 2017-09-02 (×2): 50 ug via INTRAVENOUS

## 2017-09-02 MED ORDER — CEFAZOLIN SODIUM-DEXTROSE 2-4 GM/100ML-% IV SOLN
2.0000 g | INTRAVENOUS | Status: AC
  Administered 2017-09-02: 2 g via INTRAVENOUS
  Filled 2017-09-02: qty 100

## 2017-09-02 MED ORDER — LIDOCAINE-EPINEPHRINE 1 %-1:100000 IJ SOLN
INTRAMUSCULAR | Status: DC | PRN
  Administered 2017-09-02: 30 mL

## 2017-09-02 MED ORDER — FENTANYL CITRATE (PF) 100 MCG/2ML IJ SOLN
INTRAMUSCULAR | Status: AC
  Filled 2017-09-02: qty 2

## 2017-09-02 MED ORDER — MIDAZOLAM HCL 5 MG/5ML IJ SOLN
INTRAMUSCULAR | Status: DC | PRN
  Administered 2017-09-02: 2 mg via INTRAVENOUS

## 2017-09-02 MED ORDER — ONDANSETRON HCL 4 MG/2ML IJ SOLN
INTRAMUSCULAR | Status: DC | PRN
  Administered 2017-09-02: 4 mg via INTRAVENOUS

## 2017-09-02 MED ORDER — LACTATED RINGERS IV SOLN
INTRAVENOUS | Status: DC
  Administered 2017-09-02 (×3): via INTRAVENOUS

## 2017-09-02 MED ORDER — OXYCODONE HCL 5 MG/5ML PO SOLN: 5.0000 mg | Freq: Once | ORAL | Status: AC | PRN

## 2017-09-02 SURGICAL SUPPLY — 51 items
APL SKNCLS STERI-STRIP NONHPOA (GAUZE/BANDAGES/DRESSINGS)
APPLICATOR COTTON TIP 6IN STRL (MISCELLANEOUS) IMPLANT
BAG DECANTER FOR FLEXI CONT (MISCELLANEOUS) IMPLANT
BENZOIN TINCTURE PRP APPL 2/3 (GAUZE/BANDAGES/DRESSINGS) ×1 IMPLANT
CANISTER SUCT 3000ML PPV (MISCELLANEOUS) ×2 IMPLANT
CANISTER WOUND CARE 500ML ATS (WOUND CARE) ×2 IMPLANT
CONT SPEC 4OZ CLIKSEAL STRL BL (MISCELLANEOUS) IMPLANT
COVER SURGICAL LIGHT HANDLE (MISCELLANEOUS) ×2 IMPLANT
DRAPE HALF SHEET 40X57 (DRAPES) IMPLANT
DRAPE INCISE IOBAN 66X45 STRL (DRAPES) IMPLANT
DRAPE LAPAROSCOPIC ABDOMINAL (DRAPES) IMPLANT
DRAPE LAPAROTOMY 100X72 PEDS (DRAPES) ×2 IMPLANT
DRESSING HYDROCOLLOID 4X4 (GAUZE/BANDAGES/DRESSINGS) ×2 IMPLANT
DRSG ADAPTIC 3X8 NADH LF (GAUZE/BANDAGES/DRESSINGS) IMPLANT
DRSG PAD ABDOMINAL 8X10 ST (GAUZE/BANDAGES/DRESSINGS) IMPLANT
DRSG VAC ATS LRG SENSATRAC (GAUZE/BANDAGES/DRESSINGS) ×1 IMPLANT
DRSG VAC ATS MED SENSATRAC (GAUZE/BANDAGES/DRESSINGS) IMPLANT
DRSG VAC ATS SM SENSATRAC (GAUZE/BANDAGES/DRESSINGS) IMPLANT
DRSG VERSA FOAM LRG 10X15 (GAUZE/BANDAGES/DRESSINGS) ×2 IMPLANT
ELECT CAUTERY BLADE 6.4 (BLADE) ×2 IMPLANT
ELECT REM PT RETURN 9FT ADLT (ELECTROSURGICAL) ×2
ELECTRODE REM PT RTRN 9FT ADLT (ELECTROSURGICAL) ×1 IMPLANT
GAUZE SPONGE 4X4 12PLY STRL (GAUZE/BANDAGES/DRESSINGS) IMPLANT
GEL ULTRASOUND 20GR AQUASONIC (MISCELLANEOUS) IMPLANT
GLOVE BIO SURGEON STRL SZ 6.5 (GLOVE) ×4 IMPLANT
GLOVE BIO SURGEON STRL SZ7.5 (GLOVE) ×2 IMPLANT
GLOVE BIOGEL PI IND STRL 7.5 (GLOVE) IMPLANT
GLOVE BIOGEL PI INDICATOR 7.5 (GLOVE) ×1
GOWN STRL REUS W/ TWL LRG LVL3 (GOWN DISPOSABLE) ×3 IMPLANT
GOWN STRL REUS W/TWL LRG LVL3 (GOWN DISPOSABLE) ×6
KIT BASIN OR (CUSTOM PROCEDURE TRAY) ×2 IMPLANT
KIT ROOM TURNOVER OR (KITS) ×2 IMPLANT
NDL HYPO 25GX1X1/2 BEV (NEEDLE) ×1 IMPLANT
NEEDLE HYPO 25GX1X1/2 BEV (NEEDLE) ×6 IMPLANT
NS IRRIG 1000ML POUR BTL (IV SOLUTION) ×2 IMPLANT
PACK GENERAL/GYN (CUSTOM PROCEDURE TRAY) ×2 IMPLANT
PACK UNIVERSAL I (CUSTOM PROCEDURE TRAY) ×2 IMPLANT
PAD ARMBOARD 7.5X6 YLW CONV (MISCELLANEOUS) ×4 IMPLANT
STAPLER VISISTAT 35W (STAPLE) ×1 IMPLANT
STOCKINETTE IMPERVIOUS 9X36 MD (GAUZE/BANDAGES/DRESSINGS) IMPLANT
STOCKINETTE IMPERVIOUS LG (DRAPES) IMPLANT
SURGILUBE 2OZ TUBE FLIPTOP (MISCELLANEOUS) IMPLANT
SUT MNCRL AB 4-0 PS2 18 (SUTURE) IMPLANT
SUT PROLENE 0 CT (SUTURE) ×1 IMPLANT
SUT SILK 4 0 P 3 (SUTURE) IMPLANT
SUT VIC AB 5-0 PS2 18 (SUTURE) IMPLANT
SWAB COLLECTION DEVICE MRSA (MISCELLANEOUS) IMPLANT
SWAB CULTURE ESWAB REG 1ML (MISCELLANEOUS) IMPLANT
SYR CONTROL 10ML LL (SYRINGE) ×4 IMPLANT
TUBE CONNECTING 12X1/4 (SUCTIONS) ×2 IMPLANT
YANKAUER SUCT BULB TIP NO VENT (SUCTIONS) ×2 IMPLANT

## 2017-09-02 NOTE — H&P (Signed)
  H&P faxed to surgical center.  -History and Physical Reviewed  -Patient has been re-examined  -No change in the plan of care  CONTOGIANNIS,MARY A    

## 2017-09-02 NOTE — Discharge Instructions (Signed)
1. No lifting greater than 5 lbs with arms for 4 weeks. 2. Keep VAC dressing in place- [place] and [employer] representative to change VAC dressing at home on Wednesday, 09/04/17. 3. Call office [phone] to schedule a follow-up appointment for 7-10 days. 4. Resume home medications per Dr. Erin Hearing [facility] team.

## 2017-09-02 NOTE — Transfer of Care (Signed)
Immediate Anesthesia Transfer of Care Note  Patient: Jacqueline Goodman  Procedure(s) Performed: WOUND DEBRIDEMENT (N/A )  Patient Location: PACU  Anesthesia Type:MAC  Level of Consciousness: awake, alert  and oriented  Airway & Oxygen Therapy: Patient Spontanous Breathing  Post-op Assessment: Report given to RN  Post vital signs: Reviewed and stable  Last Vitals:  Vitals:   09/02/17 0923  BP: (!) 144/60  Pulse: 76  Resp: 18  Temp: 37.1 C  SpO2: 100%    Last Pain:  Vitals:   09/02/17 1001  TempSrc:   PainSc: 10-Worst pain ever      Patients Stated Pain Goal: 5 (89/37/34 2876)  Complications: No apparent anesthesia complications

## 2017-09-02 NOTE — Anesthesia Preprocedure Evaluation (Signed)
Anesthesia Evaluation  Patient identified by MRN, date of birth, ID band Patient awake    Reviewed: Allergy & Precautions, NPO status , Patient's Chart, lab work & pertinent test results  History of Anesthesia Complications (+) PONV and history of anesthetic complications  Airway Mallampati: II  TM Distance: <3 FB Neck ROM: Full    Dental  (+) Teeth Intact   Pulmonary neg shortness of breath, neg sleep apnea, neg COPD, neg recent URI,    breath sounds clear to auscultation       Cardiovascular hypertension, Pt. on medications  Rhythm:Regular     Neuro/Psych PSYCHIATRIC DISORDERS Anxiety negative neurological ROS     GI/Hepatic negative GI ROS, Neg liver ROS,   Endo/Other  diabetes, Type 2Morbid obesity  Renal/GU      Musculoskeletal   Abdominal   Peds  Hematology  (+) anemia ,   Anesthesia Other Findings   Reproductive/Obstetrics                             Anesthesia Physical Anesthesia Plan  ASA: III  Anesthesia Plan: MAC   Post-op Pain Management:    Induction: Intravenous  PONV Risk Score and Plan: 3 and Ondansetron  Airway Management Planned: Nasal Cannula  Additional Equipment: None  Intra-op Plan:   Post-operative Plan:   Informed Consent: I have reviewed the patients History and Physical, chart, labs and discussed the procedure including the risks, benefits and alternatives for the proposed anesthesia with the patient or authorized representative who has indicated his/her understanding and acceptance.   Dental advisory given  Plan Discussed with: CRNA and Surgeon  Anesthesia Plan Comments:         Anesthesia Quick Evaluation

## 2017-09-02 NOTE — Brief Op Note (Signed)
09/02/2017  5:59 PM  PATIENT:  Jacqueline Goodman  67 y.o. 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   Delay start of Pharmacological VTE agent (>24hrs) due to surgical blood loss or risk of bleeding: not applicable

## 2017-09-03 ENCOUNTER — Encounter (HOSPITAL_COMMUNITY): Payer: Self-pay | Admitting: Plastic Surgery

## 2017-09-03 DIAGNOSIS — L7682 Other postprocedural complications of skin and subcutaneous tissue: Secondary | ICD-10-CM | POA: Diagnosis not present

## 2017-09-03 DIAGNOSIS — E119 Type 2 diabetes mellitus without complications: Secondary | ICD-10-CM | POA: Diagnosis not present

## 2017-09-03 DIAGNOSIS — I10 Essential (primary) hypertension: Secondary | ICD-10-CM | POA: Diagnosis not present

## 2017-09-03 DIAGNOSIS — J189 Pneumonia, unspecified organism: Secondary | ICD-10-CM | POA: Diagnosis not present

## 2017-09-03 DIAGNOSIS — E8809 Other disorders of plasma-protein metabolism, not elsewhere classified: Secondary | ICD-10-CM | POA: Diagnosis not present

## 2017-09-03 DIAGNOSIS — T8189XD Other complications of procedures, not elsewhere classified, subsequent encounter: Secondary | ICD-10-CM | POA: Diagnosis not present

## 2017-09-03 NOTE — Anesthesia Postprocedure Evaluation (Signed)
Anesthesia Post Note  Patient: Jacqueline Goodman  Procedure(s) Performed: WOUND DEBRIDEMENT (N/A )     Patient location during evaluation: PACU Anesthesia Type: MAC Level of consciousness: awake and alert Pain management: pain level controlled Vital Signs Assessment: post-procedure vital signs reviewed and stable Respiratory status: spontaneous breathing Cardiovascular status: stable Anesthetic complications: no    Last Vitals:  Vitals:   09/02/17 1845 09/02/17 1900  BP:    Pulse: 93 95  Resp: (!) 21 19  Temp:    SpO2: 93% 98%    Last Pain:  Vitals:   09/02/17 1812  TempSrc:   PainSc: 10-Worst pain ever   Pain Goal: Patients Stated Pain Goal: 5 (09/02/17 1001)               Nolon Nations

## 2017-09-04 ENCOUNTER — Encounter (HOSPITAL_COMMUNITY): Payer: Self-pay | Admitting: Emergency Medicine

## 2017-09-04 ENCOUNTER — Emergency Department (HOSPITAL_COMMUNITY): Payer: Medicare Other

## 2017-09-04 ENCOUNTER — Encounter: Payer: Self-pay | Admitting: Family Medicine

## 2017-09-04 ENCOUNTER — Ambulatory Visit (INDEPENDENT_AMBULATORY_CARE_PROVIDER_SITE_OTHER): Payer: Medicare Other | Admitting: Family Medicine

## 2017-09-04 ENCOUNTER — Other Ambulatory Visit: Payer: Self-pay

## 2017-09-04 ENCOUNTER — Emergency Department (HOSPITAL_COMMUNITY)
Admission: EM | Admit: 2017-09-04 | Discharge: 2017-09-05 | Disposition: A | Discharge status: Home / Self Care | Payer: Medicare Other | Attending: Emergency Medicine | Admitting: Emergency Medicine

## 2017-09-04 DIAGNOSIS — E1169 Type 2 diabetes mellitus with other specified complication: Secondary | ICD-10-CM | POA: Diagnosis not present

## 2017-09-04 DIAGNOSIS — E1165 Type 2 diabetes mellitus with hyperglycemia: Secondary | ICD-10-CM | POA: Insufficient documentation

## 2017-09-04 DIAGNOSIS — T8189XD Other complications of procedures, not elsewhere classified, subsequent encounter: Secondary | ICD-10-CM | POA: Diagnosis not present

## 2017-09-04 DIAGNOSIS — R112 Nausea with vomiting, unspecified: Secondary | ICD-10-CM | POA: Diagnosis not present

## 2017-09-04 DIAGNOSIS — R739 Hyperglycemia, unspecified: Secondary | ICD-10-CM

## 2017-09-04 DIAGNOSIS — Z79899 Other long term (current) drug therapy: Secondary | ICD-10-CM | POA: Diagnosis not present

## 2017-09-04 DIAGNOSIS — J189 Pneumonia, unspecified organism: Secondary | ICD-10-CM | POA: Diagnosis not present

## 2017-09-04 DIAGNOSIS — I10 Essential (primary) hypertension: Secondary | ICD-10-CM | POA: Diagnosis not present

## 2017-09-04 DIAGNOSIS — R5383 Other fatigue: Secondary | ICD-10-CM

## 2017-09-04 DIAGNOSIS — Z7984 Long term (current) use of oral hypoglycemic drugs: Secondary | ICD-10-CM | POA: Diagnosis not present

## 2017-09-04 DIAGNOSIS — E669 Obesity, unspecified: Secondary | ICD-10-CM | POA: Diagnosis not present

## 2017-09-04 DIAGNOSIS — R1084 Generalized abdominal pain: Secondary | ICD-10-CM | POA: Diagnosis not present

## 2017-09-04 DIAGNOSIS — I700 Atherosclerosis of aorta: Secondary | ICD-10-CM | POA: Diagnosis not present

## 2017-09-04 DIAGNOSIS — L7682 Other postprocedural complications of skin and subcutaneous tissue: Secondary | ICD-10-CM | POA: Diagnosis not present

## 2017-09-04 DIAGNOSIS — E8809 Other disorders of plasma-protein metabolism, not elsewhere classified: Secondary | ICD-10-CM | POA: Diagnosis not present

## 2017-09-04 DIAGNOSIS — E119 Type 2 diabetes mellitus without complications: Secondary | ICD-10-CM | POA: Diagnosis not present

## 2017-09-04 DIAGNOSIS — R509 Fever, unspecified: Secondary | ICD-10-CM | POA: Diagnosis not present

## 2017-09-04 DIAGNOSIS — R0602 Shortness of breath: Secondary | ICD-10-CM | POA: Diagnosis not present

## 2017-09-04 LAB — CBC WITH DIFFERENTIAL/PLATELET
Basophils Absolute: 0 10*3/uL (ref 0.0–0.1)
Basophils Relative: 0 %
Eosinophils Absolute: 0.1 10*3/uL (ref 0.0–0.7)
Eosinophils Relative: 1 %
HCT: 30.6 % — ABNORMAL LOW (ref 36.0–46.0)
Hemoglobin: 9.9 g/dL — ABNORMAL LOW (ref 12.0–15.0)
Lymphocytes Relative: 9 %
Lymphs Abs: 1 10*3/uL (ref 0.7–4.0)
MCH: 27.2 pg (ref 26.0–34.0)
MCHC: 32.4 g/dL (ref 30.0–36.0)
MCV: 84.1 fL (ref 78.0–100.0)
Monocytes Absolute: 0.8 10*3/uL (ref 0.1–1.0)
Monocytes Relative: 7 %
Neutro Abs: 9.5 10*3/uL — ABNORMAL HIGH (ref 1.7–7.7)
Neutrophils Relative %: 83 %
Platelets: 452 10*3/uL — ABNORMAL HIGH (ref 150–400)
RBC: 3.64 MIL/uL — ABNORMAL LOW (ref 3.87–5.11)
RDW: 13.9 % (ref 11.5–15.5)
WBC: 11.4 10*3/uL — ABNORMAL HIGH (ref 4.0–10.5)

## 2017-09-04 LAB — COMPREHENSIVE METABOLIC PANEL
ALT: 19 U/L (ref 14–54)
AST: 26 U/L (ref 15–41)
Albumin: 2.5 g/dL — ABNORMAL LOW (ref 3.5–5.0)
Alkaline Phosphatase: 83 U/L (ref 38–126)
Anion gap: 10 (ref 5–15)
BUN: 35 mg/dL — ABNORMAL HIGH (ref 6–20)
CO2: 21 mmol/L — ABNORMAL LOW (ref 22–32)
Calcium: 8.4 mg/dL — ABNORMAL LOW (ref 8.9–10.3)
Chloride: 99 mmol/L — ABNORMAL LOW (ref 101–111)
Creatinine, Ser: 0.87 mg/dL (ref 0.44–1.00)
GFR calc Af Amer: >60 mL/min (ref >60)
GFR calc non Af Amer: >60 mL/min (ref >60)
Glucose, Bld: 252 mg/dL — ABNORMAL HIGH (ref 65–99)
Potassium: 4.7 mmol/L (ref 3.5–5.1)
Sodium: 130 mmol/L — ABNORMAL LOW (ref 135–145)
Total Bilirubin: 0.6 mg/dL (ref 0.3–1.2)
Total Protein: 6.7 g/dL (ref 6.5–8.1)

## 2017-09-04 LAB — I-STAT TROPONIN, ED: Troponin i, poc: 0.01 ng/mL (ref 0.00–0.08)

## 2017-09-04 LAB — URINALYSIS, ROUTINE W REFLEX MICROSCOPIC
Bilirubin Urine: NEGATIVE
Glucose, UA: NEGATIVE mg/dL
Ketones, ur: NEGATIVE mg/dL
Leukocytes, UA: NEGATIVE
Nitrite: NEGATIVE
Protein, ur: 30 mg/dL — AB
Specific Gravity, Urine: 1.012 (ref 1.005–1.030)
pH: 7 (ref 5.0–8.0)

## 2017-09-04 LAB — I-STAT CG4 LACTIC ACID, ED
Lactic Acid, Venous: 2.12 mmol/L (ref 0.5–1.9)
Lactic Acid, Venous: 1.45 mmol/L (ref 0.5–1.9)

## 2017-09-04 LAB — CBG MONITORING, ED: Glucose-Capillary: 224 mg/dL — ABNORMAL HIGH (ref 65–99)

## 2017-09-04 MED ORDER — IOPAMIDOL (ISOVUE-370) INJECTION 76%
INTRAVENOUS | Status: AC
  Administered 2017-09-04: 100 mL
  Filled 2017-09-04: qty 100

## 2017-09-04 MED ORDER — HYDROMORPHONE HCL 1 MG/ML IJ SOLN
1.0000 mg | Freq: Once | INTRAMUSCULAR | Status: AC
  Administered 2017-09-04: 1 mg via INTRAVENOUS
  Filled 2017-09-04: qty 1

## 2017-09-04 MED ORDER — PRO-STAT SUGAR FREE PO LIQD: 30.0000 mL | Freq: Three times a day (TID) | ORAL | 2 refills | Status: DC

## 2017-09-04 MED ORDER — SODIUM CHLORIDE 0.9 % IV BOLUS (SEPSIS)
1000.0000 mL | Freq: Once | INTRAVENOUS | Status: AC
  Administered 2017-09-04: 1000 mL via INTRAVENOUS

## 2017-09-04 MED ORDER — ONDANSETRON HCL 4 MG/2ML IJ SOLN
4.0000 mg | Freq: Once | INTRAMUSCULAR | Status: AC
  Administered 2017-09-04: 4 mg via INTRAVENOUS
  Filled 2017-09-04: qty 2

## 2017-09-04 NOTE — Assessment & Plan Note (Signed)
Stable.  Encourage activity  

## 2017-09-04 NOTE — Patient Instructions (Addendum)
Good to see you today!  Thanks for coming in.  Take a multivitamin every day  If you have fevers or are very tired more so than usual then call us  You should slowly be improving   1 month unless worsening

## 2017-09-04 NOTE — Progress Notes (Signed)
Subjective  Patient is presenting with the following illnesses  DIABETES Disease Monitoring: Blood Sugar ranges(Severity) -in low 100s  Associated Symptoms- Polyuria/phagia/dipsia- no      Visual problems- no Medications: Compliance(Modifying factor) - taking 2 mg amaryl Hypoglycemic symptoms- no Timing - continuous  FOLLOW UP FATIGUE Was feeling better but then had debridement Monday and now still tired.  No specific pain or fever or syncope.  Eating ok.  Not sleepign very well.  No change in red spots on leg    Monitoring Labs and Parameters Last A1C:  Lab Results  Component Value Date   HGBA1C 6.1 08/23/2017   Last Lipid:     Component Value Date/Time   CHOL 206 (H) 12/12/2016 1115   HDL 57 12/12/2016 1115   LDLDIRECT 127 (H) 06/05/2017 1113   Last Bmet  Potassium  Date Value Ref Range Status  09/02/2017 4.6 3.5 - 5.1 mmol/L Final   Sodium  Date Value Ref Range Status  09/02/2017 132 (L) 135 - 145 mmol/L Final  08/28/2017 136 134 - 144 mmol/L Final   Creat  Date Value Ref Range Status  06/27/2016 0.88 0.50 - 0.99 mg/dL Final    Comment:      For patients > or = 67 years of age: The upper reference limit for Creatinine is approximately 13% higher for people identified as African-American.      Creatinine, Ser  Date Value Ref Range Status  09/02/2017 0.74 0.44 - 1.00 mg/dL Final      Chief Complaint noted Review of Symptoms - see HPI PMH - Smoking status noted.     Objective Vital Signs reviewed Alert nad Able to stand and get on exam table with stabilizing help Heart - Regular rate and rhythm.  No murmurs, gallops or rubs.    Lungs:  Normal respiratory effort, chest expands symmetrically. Lungs are clear to auscultation, no crackles or wheezes. Skin - red areas on leg unchanged Mild lower extremity edema Abdomen - binder in place.  Large area of debridement with wound vac in place No signs of infection    Assessments/Plans  No  problem-specific Assessment & Plan notes found for this encounter.   See after visit summary for details of patient instuctions

## 2017-09-04 NOTE — Assessment & Plan Note (Signed)
Stable continue current medications

## 2017-09-04 NOTE — ED Triage Notes (Addendum)
Patient's family reported that pt.'s blood sugar is 322 this evening ( has not taken her diabetic medication today)  with emesis x1 , feeling drowsy with fever and chills. Patient has a wound vac at abdomen S/P infected tummy tuck surgery last month .

## 2017-09-04 NOTE — ED Provider Notes (Signed)
[facility] EMERGENCY DEPARTMENT Provider Note   CSN: [id number] Arrival date & time: 09/04/17  2017     History   Chief Complaint Chief Complaint  Patient presents with  . Hyperglycemia  . Abdominal Pain    HPI Jacqueline Goodman is a 67 y.o. female.  Patient presents to the ED with a chief complaint of hyperglycemia.  She states that her blood sugar has been running high today.  She recently underwent tummy-tuck by Dr. Nathanial Rancher.  She has had wound revision and has a wound vac in place.  She also states that she has been having chest pain and SOB that has worsened today.  She reports persistent abdominal pain.  Family reports that she has had vomiting, felt drowsy and has had fever and chills.  She has not taken any of her medications due to vomiting.     The history is provided by the patient. No language interpreter was used.    Past Medical History:  Diagnosis Date  . Diabetes mellitus without complication ([place])   . Hemorrhoids   . Hypertension   . Pneumonia    as a child  . PONV (postoperative nausea and vomiting)     Patient Active Problem List   Diagnosis Date Noted  . Fatigue 08/28/2017  . Protein-calorie malnutrition, severe 08/14/2017  . Hypoglycemia 08/10/2017  . Community acquired pneumonia of left lower lobe of lung ([place])   . Wound dehiscence   . Petechiae 07/09/2017  . 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 Situational anxiety 03/13/2017  . Hyperlipidemia 07/05/2016  . Essential hypertension 06/28/2016  . Diabetes mellitus type 2 in obese ([place]) 06/07/2016    Past Surgical History:  Procedure Laterality Date  . ABDOMINAL HYSTERECTOMY     PARTIAL  . CHOLECYSTECTOMY    . COLONOSCOPY    . DEBRIDEMENT AND CLOSURE WOUND N/A 09/02/2017   Procedure: WOUND DEBRIDEMENT;  Surgeon: Youlanda Roys, MD;  Location: [facility];  Service: Plastics;  Laterality: N/A;  . INCISION AND DRAINAGE OF WOUND N/A 08/15/2017   Procedure: Ananias Pilgrim AND  DEBRIDEMENT OF ABDOMINAL WOUND;  Surgeon: Youlanda Roys, MD;  Location: [facility];  Service: Plastics;  Laterality: N/A;  SPINAL  . tummy tuck      OB History    No data available       Home Medications    Prior to Admission medications   Medication Sig Start Date End Date Taking? Authorizing Provider  Amino Acids-Protein Hydrolys (FEEDING SUPPLEMENT, PRO-STAT SUGAR FREE 64,) LIQD Take 30 mLs by mouth 3 (three) times daily. 08/19/17   Steve Rattler, DO  Amino Acids-Protein Hydrolys (FEEDING SUPPLEMENT, PRO-STAT SUGAR FREE 64,) LIQD Take 30 mLs by mouth 3 (three) times daily with meals. 09/04/17   Lind Covert, MD  Blood Glucose Monitoring Suppl (ONE TOUCH ULTRA 2) w/Device KIT Check blood sugar once every morning before eating. ICD-10 Code: E11.9 09/11/16   Lind Covert, MD  clindamycin (CLEOCIN) 300 MG capsule Take 300 mg by mouth 4 (four) times daily. Complete by 09/01/2017    [provider]  diphenhydrAMINE (BENADRYL) 25 MG tablet Take 25 mg by mouth at bedtime.     [provider]  feeding supplement, GLUCERNA SHAKE, (GLUCERNA SHAKE) LIQD Take 237 mLs by mouth 3 (three) times daily between meals. 08/19/17 09/18/17  Steve Rattler, DO  glimepiride (AMARYL) 2 MG tablet Take 1 tablet (2 mg total) by mouth daily with breakfast. 08/19/17   Steve Rattler,  DO  glucose blood (ONE TOUCH ULTRA TEST) test strip Use to test blood sugar once every morning before eating. ICD-10 Code: E11.9 03/13/17   Lind Covert, MD  losartan (COZAAR) 25 MG tablet Take 1 tablet (25 mg total) by mouth daily. 09/04/17   Lind Covert, MD  oxyCODONE-acetaminophen (PERCOCET/ROXICET) 5-325 MG tablet Take 1 tablet by mouth every 4 (four) hours as needed for moderate pain or severe pain.    [provider]  polyethylene glycol (MIRALAX / GLYCOLAX) packet Take 17 g by mouth daily as needed for mild constipation. 08/19/17   Steve Rattler, DO    Family  History Family History  Problem Relation Age of Onset  . Cancer Mother   . Breast cancer Mother   . Heart disease Father   . Alcohol abuse Father   . Diabetes Father   . Alcohol abuse Sister   . Heart disease Sister     Social History Social History   Tobacco Use  . Smoking status: Never Smoker  . Smokeless tobacco: Never Used  Substance Use Topics  . Alcohol use: No  . Drug use: No     Allergies   Morphine and related and Tape   Review of Systems Review of Systems  All other systems reviewed and are negative.    Physical Exam Updated Vital Signs There were no vitals taken for this visit.  Physical Exam  Constitutional: She is oriented to person, place, and time. She appears well-developed and well-nourished.  HENT:  Head: Normocephalic and atraumatic.  Eyes: Conjunctivae and EOM are normal. Pupils are equal, round, and reactive to light.  Neck: Normal range of motion. Neck supple.  Cardiovascular: Normal rate and regular rhythm. Exam reveals no gallop and no friction rub.  No murmur heard. Pulmonary/Chest: Effort normal and breath sounds normal. No respiratory distress. She has no wheezes. She has no rales. She exhibits no tenderness.  Abdominal: Soft. Bowel sounds are normal. She exhibits no distension and no mass. There is no tenderness. There is no rebound and no guarding.  Wound vac in place, abdominal binder in place  Musculoskeletal: Normal range of motion. She exhibits no edema or tenderness.  Neurological: She is alert and oriented to person, place, and time.  Skin: Skin is warm and dry.  Psychiatric: She has a normal mood and affect. Her behavior is normal. Judgment and thought content normal.  Nursing note and vitals reviewed.    ED Treatments / Results  Labs (all labs ordered are listed, but only abnormal results are displayed) Labs Reviewed  CBC WITH DIFFERENTIAL/PLATELET - Abnormal; Notable for the following components:      Result Value    WBC 11.4 (*)    RBC 3.64 (*)    Hemoglobin 9.9 (*)    HCT 30.6 (*)    Platelets 452 (*)    Neutro Abs 9.5 (*)    All other components within normal limits  COMPREHENSIVE METABOLIC PANEL - Abnormal; Notable for the following components:   Sodium 130 (*)    Chloride 99 (*)    CO2 21 (*)    Glucose, Bld 252 (*)    BUN 35 (*)    Calcium 8.4 (*)    Albumin 2.5 (*)    All other components within normal limits  URINALYSIS, ROUTINE W REFLEX MICROSCOPIC - Abnormal; Notable for the following components:   Hgb urine dipstick SMALL (*)    Protein, ur 30 (*)    Bacteria, UA RARE (*)  Squamous Epithelial / LPF 0-5 (*)    All other components within normal limits  CBG MONITORING, ED - Abnormal; Notable for the following components:   Glucose-Capillary 224 (*)    All other components within normal limits  I-STAT CG4 LACTIC ACID, ED - Abnormal; Notable for the following components:   Lactic Acid, Venous 2.12 (*)    All other components within normal limits  CULTURE, BLOOD (ROUTINE X 2)  CULTURE, BLOOD (ROUTINE X 2)  I-STAT TROPONIN, ED  I-STAT CG4 LACTIC ACID, ED    EKG  EKG Interpretation None       Radiology Dg Chest 2 View  Result Date: 09/04/2017 CLINICAL DATA:  67 year old female with fever and chills. EXAM: CHEST  2 VIEW COMPARISON:  Chest radiograph dated 08/10/2017 FINDINGS: The heart size and mediastinal contours are within normal limits. Both lungs are clear. The visualized skeletal structures are unremarkable. IMPRESSION: No active cardiopulmonary disease. Electronically Signed   By: Anner Crete M.D.   On: 09/04/2017 21:18    Procedures Procedures (including critical care time)  Medications Ordered in ED Medications  sodium chloride 0.9 % bolus 1,000 mL (not administered)  HYDROmorphone (DILAUDID) injection 1 mg (not administered)  ondansetron (ZOFRAN) injection 4 mg (not administered)     Initial Impression / Assessment and Plan / ED Course  I have reviewed  the triage vital signs and the nursing notes.  Pertinent labs & imaging results that were available during my care of the patient were reviewed by me and considered in my medical decision making (see chart for details).    Patient with multiple symptoms.  She has abdominal pain, fever, and vomiting.  The pain has been constant since her surgery a month ago, but she has needed to undergo wound revisions and currently has a wound vac in place.  She reports fever to 101 at home. She saw her doctor and was told to come to the ER.  Also concerning is her increased SOB and chest pain.  Given her recent surgeries, she is at risk for PE.  As her risk is high, will proceed with CT PE.   Will treat pain, give fluids, manage hyperglycemia and reassess.  3:32 AM Patient states that she feels much better.  I advised admission, but she declined and wishes to be discharged.  Her CT results show no evidence of PE, there are expected abdominal wall defects seen on the CT.  Troponin is negative.  She ambulates maintaining >90% O2 sat and feels well.   She also has some fluid filled distention of the jejunum, but without evidence obstruction.  As patient feels much better, and requests to be discharged, will discharge to home.  She has close follow-up.  Will cover with doxycycline.    Final Clinical Impressions(s) / ED Diagnoses   Final diagnoses:  Hyperglycemia  Generalized abdominal pain  SOB (shortness of breath)    ED Discharge Orders        Ordered    doxycycline (VIBRAMYCIN) 100 MG capsule  2 times daily     09/05/17 0336    ondansetron (ZOFRAN ODT) 4 MG disintegrating tablet  Every 8 hours PRN     09/05/17 0336       Montine Circle, PA-C 09/05/17 0923    Drenda Freeze, MD 09/06/17 1055

## 2017-09-04 NOTE — ED Notes (Signed)
ED Provider at bedside. 

## 2017-09-05 DIAGNOSIS — E1165 Type 2 diabetes mellitus with hyperglycemia: Secondary | ICD-10-CM | POA: Diagnosis not present

## 2017-09-05 DIAGNOSIS — T8189XD Other complications of procedures, not elsewhere classified, subsequent encounter: Secondary | ICD-10-CM | POA: Diagnosis not present

## 2017-09-05 DIAGNOSIS — I10 Essential (primary) hypertension: Secondary | ICD-10-CM | POA: Diagnosis not present

## 2017-09-05 DIAGNOSIS — J189 Pneumonia, unspecified organism: Secondary | ICD-10-CM | POA: Diagnosis not present

## 2017-09-05 DIAGNOSIS — I700 Atherosclerosis of aorta: Secondary | ICD-10-CM | POA: Diagnosis not present

## 2017-09-05 DIAGNOSIS — E8809 Other disorders of plasma-protein metabolism, not elsewhere classified: Secondary | ICD-10-CM | POA: Diagnosis not present

## 2017-09-05 DIAGNOSIS — E119 Type 2 diabetes mellitus without complications: Secondary | ICD-10-CM | POA: Diagnosis not present

## 2017-09-05 DIAGNOSIS — L7682 Other postprocedural complications of skin and subcutaneous tissue: Secondary | ICD-10-CM | POA: Diagnosis not present

## 2017-09-05 DIAGNOSIS — R112 Nausea with vomiting, unspecified: Secondary | ICD-10-CM | POA: Diagnosis not present

## 2017-09-05 DIAGNOSIS — R509 Fever, unspecified: Secondary | ICD-10-CM | POA: Diagnosis not present

## 2017-09-05 MED ORDER — VANCOMYCIN HCL 10 G IV SOLR
2000.0000 mg | Freq: Once | INTRAVENOUS | Status: AC
  Administered 2017-09-05: 2000 mg via INTRAVENOUS
  Filled 2017-09-05: qty 2000

## 2017-09-05 MED ORDER — VANCOMYCIN HCL IN DEXTROSE 1-5 GM/200ML-% IV SOLN: 1000.0000 mg | Freq: Two times a day (BID) | INTRAVENOUS | Status: DC

## 2017-09-05 MED ORDER — ONDANSETRON 4 MG PO TBDP: 4.0000 mg | ORAL_TABLET | Freq: Three times a day (TID) | ORAL | 0 refills | Status: DC | PRN

## 2017-09-05 MED ORDER — PIPERACILLIN-TAZOBACTAM 3.375 G IVPB: 3.3750 g | Freq: Three times a day (TID) | INTRAVENOUS | Status: DC

## 2017-09-05 MED ORDER — PIPERACILLIN-TAZOBACTAM 3.375 G IVPB 30 MIN
3.3750 g | Freq: Once | INTRAVENOUS | Status: AC
  Administered 2017-09-05: 3.375 g via INTRAVENOUS
  Filled 2017-09-05: qty 50

## 2017-09-05 MED ORDER — DOXYCYCLINE HYCLATE 100 MG PO CAPS: 100.0000 mg | ORAL_CAPSULE | Freq: Two times a day (BID) | ORAL | 0 refills | Status: DC

## 2017-09-05 NOTE — Discharge Instructions (Signed)
Take your medications as directed.   Return for worsening symptoms, fever, chest pain, difficulty breathing, bloody vomit or stool.

## 2017-09-05 NOTE — ED Notes (Signed)
Pt up to ambulate with this RN. Pt ambulated down the hall while maintaining O2 sats of 98% on room air.

## 2017-09-05 NOTE — Progress Notes (Signed)
Pharmacy Antibiotic Note  Jacqueline Goodman is a 67 y.o. female admitted on 09/04/2017 with wound infection. Pt is s/p I&D earlier this week of infected panniculectomy wound - now with wound vac at site. Planning to start empiric antibiotics. It does not look like any cultures were taken from I&D on the 7th. Blood cultures in process now. LA 2.2 > 1.4, afebrile.   Plan: -Vancomycin 2 g IV x1 then 1 g IV q12h -Zosyn 3.375 g IV q8h -Monitor renal fx, cultures, VR as needed     Temp (24hrs), Avg:99 F (37.2 C), Min:98.4 F (36.9 C), Max:99.5 F (37.5 C)  Recent Labs  Lab 09/02/17 0917 09/04/17 2048 09/04/17 2123 09/04/17 2231  WBC  --  11.4*  --   --   CREATININE 0.74 0.87  --   --   LATICACIDVEN  --   --  2.12* 1.45    Estimated Creatinine Clearance: 80.3 mL/min (by C-G formula based on SCr of 0.87 mg/dL).    Allergies  Allergen Reactions  . Morphine And Related Other (See Comments)    "says doesn't work"  . Tape Rash    Breaks the skin down    Antimicrobials this admission: 1/10 vancomycin > 1/10 zosyn >  Dose adjustments this admission: N/A  Microbiology results: 1/9 blood cx:   Jacqueline Goodman 09/05/2017 12:14 AM

## 2017-09-06 ENCOUNTER — Telehealth: Payer: Self-pay | Admitting: Family Medicine

## 2017-09-06 DIAGNOSIS — I10 Essential (primary) hypertension: Secondary | ICD-10-CM | POA: Diagnosis not present

## 2017-09-06 DIAGNOSIS — E119 Type 2 diabetes mellitus without complications: Secondary | ICD-10-CM | POA: Diagnosis not present

## 2017-09-06 DIAGNOSIS — J189 Pneumonia, unspecified organism: Secondary | ICD-10-CM | POA: Diagnosis not present

## 2017-09-06 DIAGNOSIS — E8809 Other disorders of plasma-protein metabolism, not elsewhere classified: Secondary | ICD-10-CM | POA: Diagnosis not present

## 2017-09-06 DIAGNOSIS — L7682 Other postprocedural complications of skin and subcutaneous tissue: Secondary | ICD-10-CM | POA: Diagnosis not present

## 2017-09-06 DIAGNOSIS — T8189XD Other complications of procedures, not elsewhere classified, subsequent encounter: Secondary | ICD-10-CM | POA: Diagnosis not present

## 2017-09-06 NOTE — Telephone Encounter (Signed)
**  After Hours/ Emergency Line Call*  Received after hours call from patient's daughter.  Patient recently underwent wound debridement following tummy tuck 4 days ago.  Was subsequently seen 2 days later in the ED and given vancomycin and Zosyn in the ED.  Admission was offered though patient declined.  She was then given doxycycline.  Patient has been taking medication.  She continues to have fevers as high as 106F.  Daughter states patient is without shortness of breath, nausea or vomiting, abdominal pain, dysuria, diaphoresis.  Daughter also states patient will likely declined being seen in the ED if it is mentioned to her. Red flags discussed.  Instructed patient's daughter to bring patient in for ED evaluation either tonight or first thing in the morning if fevers persist.   Harriet Butte, [place], PGY-2

## 2017-09-07 ENCOUNTER — Telehealth: Payer: Self-pay | Admitting: Internal Medicine

## 2017-09-07 NOTE — Telephone Encounter (Signed)
After Hours Emergency Line Page:   Patient's daughter Megon Kalina calling with concerns about her mother. Patient underwent a tummy tuck at the end of 2018 and has had several complications post op. Hospital stay in Dec 2018 for debridement. Patient just underwent a subsequent debridement on 1/7. Daughter states dressing type was changed to include some type of "packing into the abdomen" on 1/9. Since then, patient has had elevated CBGs and fevers. Was seen in ED on 1/9. CT abdomen showed expected post operative abdominal wall defects but no evidence of abscess or intra-abdominal process. Discharged with Doxycyline for presumed soft tissue infection.   Patient has been compliant with Doxycyline. However, has continued to have fevers with Tmax 103 degrees this evening prompting phone call to after hours line. Also with poor appetite. Daughter also mentions cough and some SOB. Wound care nurse reportedly said today she was concerned wound was becoming necrotic again and would need further surgical attention.   I discussed with daughter that I am very concerned for wound infection that is not being adequately treated with Doxycyline. Cough and SOB with fevers is also concerning for PNA especially given presumed debilitated state after several recent operations. A PE would also be a concern but less likely given fevers and recent negative CTA chest from 1/9.  I have strongly recommended coming to the ED immediately for evaluation. Daughter states that she anticipates patient will give her some push back but she plans to bring her despite this.    Phill Myron, D.O. 09/07/2017, 6:08 PM PGY-3, [place]

## 2017-09-09 ENCOUNTER — Other Ambulatory Visit: Payer: Self-pay

## 2017-09-09 ENCOUNTER — Inpatient Hospital Stay (HOSPITAL_COMMUNITY)
Admission: AD | Admit: 2017-09-09 | Discharge: 2017-09-13 | DRG: 194 | Disposition: A | Discharge status: Home Health | Payer: Medicare Other | Source: Ambulatory Visit | Attending: Family Medicine | Admitting: Family Medicine

## 2017-09-09 ENCOUNTER — Encounter: Payer: Self-pay | Admitting: Internal Medicine

## 2017-09-09 ENCOUNTER — Inpatient Hospital Stay (HOSPITAL_COMMUNITY): Payer: Medicare Other

## 2017-09-09 ENCOUNTER — Ambulatory Visit (INDEPENDENT_AMBULATORY_CARE_PROVIDER_SITE_OTHER): Payer: Medicare Other | Admitting: Internal Medicine

## 2017-09-09 ENCOUNTER — Encounter (HOSPITAL_COMMUNITY): Payer: Self-pay | Admitting: General Practice

## 2017-09-09 VITALS — BP 112/58 | HR 80 | Temp 98.1°F

## 2017-09-09 DIAGNOSIS — E119 Type 2 diabetes mellitus without complications: Secondary | ICD-10-CM | POA: Diagnosis present

## 2017-09-09 DIAGNOSIS — Z794 Long term (current) use of insulin: Secondary | ICD-10-CM

## 2017-09-09 DIAGNOSIS — Z79899 Other long term (current) drug therapy: Secondary | ICD-10-CM

## 2017-09-09 DIAGNOSIS — T8149XA Infection following a procedure, other surgical site, initial encounter: Secondary | ICD-10-CM | POA: Insufficient documentation

## 2017-09-09 DIAGNOSIS — R509 Fever, unspecified: Secondary | ICD-10-CM | POA: Diagnosis present

## 2017-09-09 DIAGNOSIS — J188 Other pneumonia, unspecified organism: Secondary | ICD-10-CM | POA: Diagnosis not present

## 2017-09-09 DIAGNOSIS — J189 Pneumonia, unspecified organism: Secondary | ICD-10-CM | POA: Diagnosis present

## 2017-09-09 DIAGNOSIS — Z811 Family history of alcohol abuse and dependence: Secondary | ICD-10-CM | POA: Diagnosis not present

## 2017-09-09 DIAGNOSIS — Z9049 Acquired absence of other specified parts of digestive tract: Secondary | ICD-10-CM | POA: Diagnosis not present

## 2017-09-09 DIAGNOSIS — Z8249 Family history of ischemic heart disease and other diseases of the circulatory system: Secondary | ICD-10-CM

## 2017-09-09 DIAGNOSIS — I10 Essential (primary) hypertension: Secondary | ICD-10-CM | POA: Diagnosis present

## 2017-09-09 DIAGNOSIS — J1108 Influenza due to unidentified influenza virus with specified pneumonia: Secondary | ICD-10-CM | POA: Diagnosis not present

## 2017-09-09 DIAGNOSIS — J1100 Influenza due to unidentified influenza virus with unspecified type of pneumonia: Secondary | ICD-10-CM | POA: Diagnosis present

## 2017-09-09 DIAGNOSIS — E1169 Type 2 diabetes mellitus with other specified complication: Secondary | ICD-10-CM | POA: Diagnosis present

## 2017-09-09 DIAGNOSIS — J1008 Influenza due to other identified influenza virus with other specified pneumonia: Secondary | ICD-10-CM | POA: Diagnosis present

## 2017-09-09 DIAGNOSIS — E785 Hyperlipidemia, unspecified: Secondary | ICD-10-CM | POA: Diagnosis present

## 2017-09-09 DIAGNOSIS — T8130XA Disruption of wound, unspecified, initial encounter: Secondary | ICD-10-CM | POA: Diagnosis not present

## 2017-09-09 DIAGNOSIS — R918 Other nonspecific abnormal finding of lung field: Secondary | ICD-10-CM | POA: Diagnosis not present

## 2017-09-09 DIAGNOSIS — Z90710 Acquired absence of both cervix and uterus: Secondary | ICD-10-CM

## 2017-09-09 DIAGNOSIS — D500 Iron deficiency anemia secondary to blood loss (chronic): Secondary | ICD-10-CM | POA: Diagnosis present

## 2017-09-09 DIAGNOSIS — L03311 Cellulitis of abdominal wall: Secondary | ICD-10-CM

## 2017-09-09 DIAGNOSIS — E669 Obesity, unspecified: Secondary | ICD-10-CM | POA: Diagnosis not present

## 2017-09-09 DIAGNOSIS — Z803 Family history of malignant neoplasm of breast: Secondary | ICD-10-CM | POA: Diagnosis not present

## 2017-09-09 DIAGNOSIS — Z833 Family history of diabetes mellitus: Secondary | ICD-10-CM

## 2017-09-09 DIAGNOSIS — D638 Anemia in other chronic diseases classified elsewhere: Secondary | ICD-10-CM | POA: Diagnosis not present

## 2017-09-09 DIAGNOSIS — R0602 Shortness of breath: Secondary | ICD-10-CM

## 2017-09-09 HISTORY — DX: Infection following a procedure, other surgical site, initial encounter: T81.49XA

## 2017-09-09 LAB — COMPREHENSIVE METABOLIC PANEL
ALT: 24 U/L (ref 14–54)
AST: 46 U/L — ABNORMAL HIGH (ref 15–41)
Albumin: 2.3 g/dL — ABNORMAL LOW (ref 3.5–5.0)
Alkaline Phosphatase: 72 U/L (ref 38–126)
Anion gap: 12 (ref 5–15)
BUN: 27 mg/dL — ABNORMAL HIGH (ref 6–20)
CO2: 20 mmol/L — ABNORMAL LOW (ref 22–32)
Calcium: 8.2 mg/dL — ABNORMAL LOW (ref 8.9–10.3)
Chloride: 98 mmol/L — ABNORMAL LOW (ref 101–111)
Creatinine, Ser: 0.76 mg/dL (ref 0.44–1.00)
GFR calc Af Amer: >60 mL/min (ref >60)
GFR calc non Af Amer: >60 mL/min (ref >60)
Glucose, Bld: 59 mg/dL — ABNORMAL LOW (ref 65–99)
Potassium: 4.3 mmol/L (ref 3.5–5.1)
Sodium: 130 mmol/L — ABNORMAL LOW (ref 135–145)
Total Bilirubin: 0.5 mg/dL (ref 0.3–1.2)
Total Protein: 6.1 g/dL — ABNORMAL LOW (ref 6.5–8.1)

## 2017-09-09 LAB — CULTURE, BLOOD (ROUTINE X 2)
Culture: NO GROWTH
Culture: NO GROWTH
Special Requests: ADEQUATE
Special Requests: ADEQUATE

## 2017-09-09 LAB — CBC
HCT: 31.1 % — ABNORMAL LOW (ref 36.0–46.0)
Hemoglobin: 10 g/dL — ABNORMAL LOW (ref 12.0–15.0)
MCH: 26.6 pg (ref 26.0–34.0)
MCHC: 32.2 g/dL (ref 30.0–36.0)
MCV: 82.7 fL (ref 78.0–100.0)
Platelets: 367 10*3/uL (ref 150–400)
RBC: 3.76 MIL/uL — ABNORMAL LOW (ref 3.87–5.11)
RDW: 13.8 % (ref 11.5–15.5)
WBC: 7.3 10*3/uL (ref 4.0–10.5)

## 2017-09-09 LAB — GLUCOSE, CAPILLARY
Glucose-Capillary: 55 mg/dL — ABNORMAL LOW (ref 65–99)
Glucose-Capillary: 74 mg/dL (ref 65–99)
Glucose-Capillary: 72 mg/dL (ref 65–99)

## 2017-09-09 LAB — INFLUENZA PANEL BY PCR (TYPE A & B)
Influenza A By PCR: POSITIVE — AB
Influenza B By PCR: NEGATIVE

## 2017-09-09 LAB — MAGNESIUM: Magnesium: 2 mg/dL (ref 1.7–2.4)

## 2017-09-09 LAB — PHOSPHORUS: Phosphorus: 3 mg/dL (ref 2.5–4.6)

## 2017-09-09 MED ORDER — PIPERACILLIN-TAZOBACTAM 3.375 G IVPB
3.3750 g | Freq: Three times a day (TID) | INTRAVENOUS | Status: DC
  Administered 2017-09-09 – 2017-09-12 (×9): 3.375 g via INTRAVENOUS
  Filled 2017-09-09 (×10): qty 50

## 2017-09-09 MED ORDER — POLYETHYLENE GLYCOL 3350 17 G PO PACK: 17.0000 g | PACK | Freq: Every day | ORAL | Status: DC | PRN

## 2017-09-09 MED ORDER — VANCOMYCIN HCL IN DEXTROSE 1-5 GM/200ML-% IV SOLN
1000.0000 mg | Freq: Two times a day (BID) | INTRAVENOUS | Status: DC
  Administered 2017-09-10 – 2017-09-12 (×5): 1000 mg via INTRAVENOUS
  Filled 2017-09-09 (×6): qty 200

## 2017-09-09 MED ORDER — OXYCODONE-ACETAMINOPHEN 5-325 MG PO TABS
1.0000 | ORAL_TABLET | ORAL | Status: DC | PRN
  Administered 2017-09-10 – 2017-09-12 (×4): 1 via ORAL
  Filled 2017-09-09 (×4): qty 1

## 2017-09-09 MED ORDER — ENOXAPARIN SODIUM 40 MG/0.4ML ~~LOC~~ SOLN
40.0000 mg | SUBCUTANEOUS | Status: DC
  Administered 2017-09-09: 40 mg via SUBCUTANEOUS
  Filled 2017-09-09: qty 0.4

## 2017-09-09 MED ORDER — DOXYCYCLINE HYCLATE 100 MG PO CAPS: 100.0000 mg | ORAL_CAPSULE | Freq: Two times a day (BID) | ORAL | Status: DC

## 2017-09-09 MED ORDER — VANCOMYCIN HCL 10 G IV SOLR
2000.0000 mg | Freq: Once | INTRAVENOUS | Status: AC
  Administered 2017-09-09: 2000 mg via INTRAVENOUS
  Filled 2017-09-09: qty 2000

## 2017-09-09 MED ORDER — PRO-STAT SUGAR FREE PO LIQD
30.0000 mL | Freq: Three times a day (TID) | ORAL | Status: DC
  Administered 2017-09-10 – 2017-09-13 (×10): 30 mL via ORAL
  Filled 2017-09-09 (×10): qty 30

## 2017-09-09 MED ORDER — INSULIN ASPART 100 UNIT/ML ~~LOC~~ SOLN: 0.0000 [IU] | Freq: Three times a day (TID) | SUBCUTANEOUS | Status: DC

## 2017-09-09 MED ORDER — RAMELTEON 8 MG PO TABS
8.0000 mg | ORAL_TABLET | Freq: Every evening | ORAL | Status: DC | PRN
  Administered 2017-09-10 – 2017-09-11 (×2): 8 mg via ORAL
  Filled 2017-09-09 (×2): qty 1

## 2017-09-09 MED ORDER — ONDANSETRON 4 MG PO TBDP
4.0000 mg | ORAL_TABLET | Freq: Three times a day (TID) | ORAL | Status: DC | PRN
  Administered 2017-09-10: 4 mg via ORAL
  Filled 2017-09-09: qty 1

## 2017-09-09 MED ORDER — SODIUM CHLORIDE 0.9 % IV SOLN
INTRAVENOUS | Status: DC
  Administered 2017-09-09 – 2017-09-11 (×3): via INTRAVENOUS

## 2017-09-09 MED ORDER — GLUCERNA SHAKE PO LIQD
237.0000 mL | Freq: Three times a day (TID) | ORAL | Status: DC
  Administered 2017-09-10 – 2017-09-13 (×9): 237 mL via ORAL

## 2017-09-09 MED ORDER — LOSARTAN POTASSIUM 50 MG PO TABS
25.0000 mg | ORAL_TABLET | Freq: Every day | ORAL | Status: DC
  Administered 2017-09-10: 25 mg via ORAL
  Filled 2017-09-09: qty 1

## 2017-09-09 NOTE — H&P (Signed)
[facility] Admission History and Physical Service Pager: [phone]  Patient name: Jacqueline Goodman Medical record number: [id number] Date of birth: [birth year] Age: 67 y.o. Gender: female  Primary Care Provider: Lind Covert, MD Consultants: none Code Status: full  Chief Complaint: wound infection  Assessment and Plan: Jacqueline Goodman is a 67 y.o. female presenting with fevers and chills concerning for a wound infection. PMH is significant for tummy tuck on 07/22/17, T2DM, HTN, HLD.  Abdominal cellulitis:  Acute. Patient with fevers, chills, nausea presenting with foul-smelling abdominal wound after tummy tuck on 07/22/17.  She has previously been on Vancomycin and Zosyn in mid December for purulent drainage and wound dehiscence with I&D on 12/20.  Patient underwent debridment on 1/7 and was subsequently seen on 1/9 in the ED with concern for abdominal wound infection. Patient declined admission for IV abx and was d/c with doxycycline. Has been on oral doxycycline since without resolution of her symptoms.  Patient was seen at the clinic today with concern for poor wound healing and presistent fevers on doxycycline.  She remains normotensive, afebrile, normal RR since admission, so sepsis unlikely.  We will start Vancomycin and Zosyn for broad coverage.  - admit to med-surg, attending Dr. Mingo Amber - obtain blood cultures x 2 - start Vancomycin and Zosyn, dosing per pharmacy - mIVF @ 125 ml/hr - consult patient's plastic surgeon in the am - consider abdominal CT in the am if patient continues to deteriorate - home zofran for nausea - home percocet 5-325 mg Q4H PRN for pain  Cough, SOB: Patient with cough, shortness of breath, and recent history of fever and chills.  Doubt that she has the flu due to lack of congestion, but will test for completeness. Would have tolerance for CXR though broad spectrum abx should cover most organisms.  - EKG - CXR PA and  lateral - influenza PCR  T2DM: Last A1c 6.1 on 08/23/17.  On Amaryl at home.  Plastic surgeon recommended CBGs <150 during patient's last admission.  Will need good CBG control with ongoing infection. - moderate SSI control with meals - will add Lantus, meal coverage, HS coverage as needed depending on patient's insulin requirements  HTN: Currently normotensive at 136/58.  Takes losartan 25 mg daily at home. - continue losartan  HLD: Last lipid panel with LDL 127, total cholesterol 206.   - consider addition of statin on discharge  FEN/GI: mIVF @ 125 ml/hr, carb modified diet Prophylaxis: lovenox  Disposition: admit to med-surg, attending Dr. Mingo Amber  History of Present Illness:  Jacqueline Goodman is a 67 y.o. female presenting with wound infection.  Patient and patient's daughter report that on 1/11, patient had chills and a climbing blood sugar with a temperature of 100.6 then later 101.3.  Today, 1/14, patient has had a fever of 102.  Patient has refused to come into the hospital, even after physicians in the ED and in [facility] expressed concern that patient should be admitted.  Patient had tummy tuck on 11/26 and has her dressings change three times per week.  Wound was packed differently on 09/02/17, with packing occurring more intra-abdominally, and daughter thinks that may be the source of an infection.  Blood sugars have been in the mid- to upper-100s over the past week, which is elevated from her baseline.  Symptoms include nausea and chills.  Daughter gave mother "nausea pills" which helped.  No vomiting, diarrhea, or constipation.  Low appetite but will drink  water, coffee, and glucerna.  Patient's daughter denies AMS but says she is feeling frustrated with her medical care and fatigued.  Patient has had a cough since the weekend and has had shortness of breath for about one week.  Review Of Systems: Per HPI with the following additions:   Review of Systems  Constitutional:  Positive for chills, fever and malaise/fatigue.  HENT: Negative for congestion and sore throat.   Eyes: Negative for blurred vision and double vision.  Respiratory: Positive for cough and shortness of breath. Negative for sputum production.   Cardiovascular: Negative for chest pain and palpitations.  Gastrointestinal: Positive for nausea. Negative for abdominal pain, constipation, diarrhea and vomiting.  Genitourinary: Negative for dysuria, frequency and urgency.  Musculoskeletal: Negative for myalgias and neck pain.  Skin: Negative for itching and rash.  Neurological: Positive for weakness. Negative for focal weakness and headaches.  Psychiatric/Behavioral: Negative for substance abuse. The patient is not nervous/anxious.     Patient Active Problem List   Diagnosis Date Noted  . Fatigue 08/28/2017  . Protein-calorie malnutrition, severe 08/14/2017  . Hypoglycemia 08/10/2017  . Community acquired pneumonia of left lower lobe of lung ([place])   . Wound dehiscence   . Petechiae 07/09/2017  . 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 Situational anxiety 03/13/2017  . Hyperlipidemia 07/05/2016  . Essential hypertension 06/28/2016  . Diabetes mellitus type 2 in obese ([place]) 06/07/2016    Past Medical History: Past Medical History:  Diagnosis Date  . Diabetes mellitus without complication ([place])   . Hemorrhoids   . Hypertension   . Pneumonia    as a child  . PONV (postoperative nausea and vomiting)     Past Surgical History: Past Surgical History:  Procedure Laterality Date  . ABDOMINAL HYSTERECTOMY     PARTIAL  . CHOLECYSTECTOMY    . COLONOSCOPY    . DEBRIDEMENT AND CLOSURE WOUND N/A 09/02/2017   Procedure: WOUND DEBRIDEMENT;  Surgeon: Youlanda Roys, MD;  Location: [place];  Service: Plastics;  Laterality: N/A;  . INCISION AND DRAINAGE OF WOUND N/A 08/15/2017   Procedure: Ananias Pilgrim AND DEBRIDEMENT OF ABDOMINAL WOUND;  Surgeon: Youlanda Roys, MD;  Location: [place];   Service: Plastics;  Laterality: N/A;  SPINAL  . tummy tuck      Social History: Social History   Tobacco Use  . Smoking status: Never Smoker  . Smokeless tobacco: Never Used  Substance Use Topics  . Alcohol use: No  . Drug use: No   Additional social history:   Please also refer to relevant sections of EMR.  Family History: Family History  Problem Relation Age of Onset  . Cancer Mother   . Breast cancer Mother   . Heart disease Father   . Alcohol abuse Father   . Diabetes Father   . Alcohol abuse Sister   . Heart disease Sister    (If not completed, MUST add something in)  Allergies and Medications: Allergies  Allergen Reactions  . Morphine And Related Other (See Comments)    "says doesn't work"  . Tape Rash    Breaks the skin down   No current facility-administered medications on file prior to encounter.    Current Outpatient Medications on File Prior to Encounter  Medication Sig Dispense Refill  . Amino Acids-Protein Hydrolys (FEEDING SUPPLEMENT, PRO-STAT SUGAR FREE 64,) LIQD Take 30 mLs by mouth 3 (three) times daily. 900 mL 0  . Amino Acids-Protein Hydrolys (FEEDING SUPPLEMENT, PRO-STAT SUGAR FREE 64,) LIQD Take 30 mLs  by mouth 3 (three) times daily with meals. (Patient not taking: Reported on 09/05/2017) 900 mL 2  . Blood Glucose Monitoring Suppl (ONE TOUCH ULTRA 2) w/Device KIT Check blood sugar once every morning before eating. ICD-10 Code: E11.9 1 each 0  . diphenhydrAMINE (BENADRYL) 25 MG tablet Take 25 mg by mouth at bedtime.     Marland Kitchen doxycycline (VIBRAMYCIN) 100 MG capsule Take 1 capsule (100 mg total) by mouth 2 (two) times daily. 20 capsule 0  . feeding supplement, GLUCERNA SHAKE, (GLUCERNA SHAKE) LIQD Take 237 mLs by mouth 3 (three) times daily between meals. 21330 mL 0  . glimepiride (AMARYL) 2 MG tablet Take 1 tablet (2 mg total) by mouth daily with breakfast. 30 tablet 3  . glucose blood (ONE TOUCH ULTRA TEST) test strip Use to test blood sugar once every  morning before eating. ICD-10 Code: E11.9 100 each 3  . losartan (COZAAR) 25 MG tablet Take 1 tablet (25 mg total) by mouth daily.    . ondansetron (ZOFRAN ODT) 4 MG disintegrating tablet Take 1 tablet (4 mg total) by mouth every 8 (eight) hours as needed for nausea or vomiting. 10 tablet 0  . oxyCODONE-acetaminophen (PERCOCET/ROXICET) 5-325 MG tablet Take 1 tablet by mouth every 4 (four) hours as needed for moderate pain or severe pain.    . polyethylene glycol (MIRALAX / GLYCOLAX) packet Take 17 g by mouth daily as needed for mild constipation. 14 each 0    Objective: There were no vitals taken for this visit. Physical Exam  Constitutional: She is oriented to person, place, and time. She appears well-developed and well-nourished. No distress.  Appears pale, fatigued, disheveled, and dehydrated  Neck: Normal range of motion.  Cardiovascular: Normal rate, regular rhythm and normal heart sounds.  Pulmonary/Chest: No respiratory distress. She has no wheezes. She has no rales.  Patient short of breath while sitting, which was exacerbated by moving  Abdominal: Soft. Bowel sounds are normal.  Wound vac in place, abdominal binder in place; consult media tab for picture of wound  Musculoskeletal: Normal range of motion. She exhibits no deformity.  Neurological: She is alert and oriented to person, place, and time. No cranial nerve deficit.  Psychiatric: She has a normal mood and affect. Her behavior is normal. Judgment and thought content normal.     Labs and Imaging: CBC BMET  Recent Labs  Lab 09/09/17 1704  WBC 7.3  HGB 10.0*  HCT 31.1*  PLT 367   Recent Labs  Lab 09/09/17 1704  NA 130*  K 4.3  CL 98*  CO2 20*  BUN 27*  CREATININE 0.76  GLUCOSE 59*  CALCIUM 8.2*     Dg Chest 2 View  Result Date: 09/04/2017 CLINICAL DATA:  67 year old female with fever and chills. EXAM: CHEST  2 VIEW COMPARISON:  Chest radiograph dated 08/10/2017 FINDINGS: The heart size and mediastinal  contours are within normal limits. Both lungs are clear. The visualized skeletal structures are unremarkable. IMPRESSION: No active cardiopulmonary disease. Electronically Signed   By: Anner Crete M.D.   On: 09/04/2017 21:18   Ct Angio Chest Pe W And/or Wo Contrast  Result Date: 09/05/2017 CLINICAL DATA:  Hyperglycemia this evening with nausea and vomiting. Fever and chills. EXAM: CT ANGIOGRAPHY CHEST WITH CONTRAST TECHNIQUE: Multidetector CT imaging of the chest was performed using the standard protocol during bolus administration of intravenous contrast. Multiplanar CT image reconstructions and MIPs were obtained to evaluate the vascular anatomy. CONTRAST:  100 cc Isovue 370 IV  COMPARISON:  None. FINDINGS: Cardiovascular: Cardiomegaly without pericardial effusion. No large central pulmonary embolus. A tiny linear filling defect is suggested within a left lower lobe pulmonary arterial branch, series 1, image 73 but is believed to be due to motion artifact and is not confirmed on MIP imaging. Ascending thoracic aortic aneurysm up to 4 cm is noted without dissection. There is aortic atherosclerosis and arteriosclerosis of the left circumflex. Mediastinum/Nodes: No mediastinal adenopathy. Patent trachea and mainstem bronchi. No thyromegaly. Hypodense 1.4 cm nodule in the left thyroid gland. Lungs/Pleura: Dependent atelectasis. No pneumonic consolidation, effusion or pneumothorax. Upper Abdomen: Status post cholecystectomy. No acute upper abdominal abnormality. Small hiatal hernia. Musculoskeletal: No chest wall abnormality. No acute or significant osseous findings. Review of the MIP images confirms the above findings. IMPRESSION: 1. Cardiomegaly with left ventricular hypertrophy. 2. No large central pulmonary embolus is noted. 3. Aortic atherosclerosis and coronary arteriosclerosis. 4. No active pulmonary disease. Aortic Atherosclerosis (ICD10-I70.0). Electronically Signed   By: Ashley Royalty M.D.   On:  09/05/2017 00:53   Ct Abdomen Pelvis W Contrast  Result Date: 09/05/2017 CLINICAL DATA:  Elevated blood sugars this evening with nausea and vomiting. Fever and chills. EXAM: CT ABDOMEN AND PELVIS WITH CONTRAST TECHNIQUE: Multidetector CT imaging of the abdomen and pelvis was performed using the standard protocol following bolus administration of intravenous contrast. CONTRAST:  132m ISOVUE-370 IOPAMIDOL (ISOVUE-370) INJECTION 76% COMPARISON:  08/11/2017 FINDINGS: Lower chest: Normal size heart without pericardial effusion. Calcifications in the region of the mitral valve and coronary arteriosclerosis is noted. Dependent atelectasis is identified at the lung bases. There is a small hiatal hernia. Hepatobiliary: Status post cholecystectomy. Stable 14 mm right hepatic hypodensity, nonspecific but possibly a small complex cyst or hemangioma. Mild reservoir effect accounting for intrahepatic ductal dilatation on the basis of prior cholecystectomy. No choledocholithiasis. Pancreas: Atrophy of the pancreas. Spleen: Normal size spleen without mass. Adrenals/Urinary Tract: Adrenal glands are unremarkable. Kidneys are normal, without renal calculi, focal lesion, or hydronephrosis. Bladder is unremarkable. Stomach/Bowel: Nondistended stomach with normal small bowel rotation. Short segment of fluid-filled mild distention of jejunum in the left hemiabdomen without mechanical source of obstruction. Findings could represent a mild localized ileus or relative area of small bowel dysmotility. Moderate fecal retention within the colon. Normal appendix. Vascular/Lymphatic: No significant vascular findings are present. No enlarged abdominal or pelvic lymph nodes. Reproductive: Status post hysterectomy. No adnexal masses. Other: Healing by secondary intent status post debridement of left ventral abdominal wall wound. Musculoskeletal: No acute or significant osseous findings. Osteoarthritis of the SI joints. IMPRESSION: 1. Large  ventral abdominal wall defect status post debridement with likely changes of healing from secondary intent. 2. Status post cholecystectomy with stable 14 mm right hepatic hypodensity possibly a stable complex cyst or hemangioma. No aggressive features noted. There is hepatic steatosis. 3. Short segment of fluid-filled mild distention of jejunum in the left hemiabdomen without mechanical source of obstruction. Findings likely represent an area of relative small bowel dysmotility, less likely mild localized ileus. Electronically Signed   By: DAshley RoyaltyM.D.   On: 09/05/2017 01:22   Ct Abdomen Pelvis W Contrast  Result Date: 08/11/2017 CLINICAL DATA:  Abdominal pain and fever.  Suspected abscess. EXAM: CT ABDOMEN AND PELVIS WITH CONTRAST TECHNIQUE: Multidetector CT imaging of the abdomen and pelvis was performed using the standard protocol following bolus administration of intravenous contrast. CONTRAST:  1058mISOVUE-300 IOPAMIDOL (ISOVUE-300) INJECTION 61% COMPARISON:  04/19/2017 FINDINGS: Lower chest: Bilateral hypoventilatory changes. Hepatobiliary: No focal liver abnormality  is seen. Status post cholecystectomy. No biliary dilatation. Pancreas: Unremarkable. No pancreatic ductal dilatation or surrounding inflammatory changes. Spleen: Normal in size without focal abnormality. Adrenals/Urinary Tract: Adrenal glands are unremarkable. Kidneys are normal, without renal calculi, focal lesion, or hydronephrosis. Bladder is unremarkable. Stomach/Bowel: Stomach is within normal limits. Appendix appears normal. No evidence of bowel wall thickening, distention, or inflammatory changes. Vascular/Lymphatic: Mild aortic atherosclerosis. No enlarged abdominal or pelvic lymph nodes. Reproductive: Status post hysterectomy. No adnexal masses. Other: Subcutaneous fat stranding along the anterior abdominal wall spans the entire length of the abdomen and ascends to the lower thorax. Small amount of gas tracks along the external  fascial planes. There is a small localized fluid collection which measures approximately 5 x 1.4 cm in the periumbilical anterior abdominal wall. Draining catheter is present within the anterior pelvic wall. Musculoskeletal: No acute findings. IMPRESSION: Fat stranding, and small amount of gas along the anterior abdominal wall, with 5 cm localized fluid collection in the periumbilical area. This may represent fasciitis/cellulitis. It is difficult to determine whether the small amount of gas is due to gas producing organism or postprocedural. Favor postprocedural etiology. No evidence of acute abnormalities intra-abdominally. Electronically Signed   By: Fidela Salisbury M.D.   On: 08/11/2017 15:50     Kathrene Alu, MD 09/09/2017, 4:19 PM PGY-1, [place] Intern pager: [phone], text pages welcome  Upper Level Addendum: I have seen and evaluated this patient along with Dr. Shan Levans and reviewed the above note, making necessary revisions in blue.  Harriet Butte, [place], PGY-2

## 2017-09-09 NOTE — Progress Notes (Signed)
   [facility] Phone: [phone]   Date of Visit: 09/09/2017   HPI:  General Malaise and fevers: -Patient presents for general malaise, fevers, nausea -She reports of some rhinorrhea in the past few days -Per chart review, patient had a tummy tuck on November 26.  She was admitted to the hospital from December 15 to the 26 due to infection at the surgical site.  She required IV antibiotics.  She had a outpatient debridement of the wound area on January 7 and has had a wound VAC.  Since January 9 she has been having fevers and hyperglycemia.  She was seen in the ED on January 10th and she had CT abdomen and pelvis done as well as CT angios chest to rule out for PE.  At that time the ED physician felt that she required inpatient admission for IV antibiotics due to her history of complications from her abdominal surgery.  But patient declined and therefore she was started on doxycycline.  Her daughter reports that on 10 January she seemed to improve somewhat but starting on the 11th she started having chills and fevers again to 101.3.  Daughter reports that her symptoms seem to come when her dressing is changed.  Her wound VAC has been draining some yellow material.  She had a fever of 103.6 on 12 January.  Her last fever was this morning.  She denies any vomiting or diarrhea.  ROS: See HPI.  [place]:  PMH: HTN DM2 HLD  PHYSICAL EXAM: BP (!) 112/58   Pulse 80   Temp 98.1 F (36.7 C) (Oral)   SpO2 99%  GEN: Appears slightly pale and ill-appearing CV: RRR, no murmurs, rubs, or gallops PULM: CTAB, normal effort but mildly tachypneic ABD: Soft, wound VAC on her lower abdomen.  There is some mild erythema right around the border of her wound but within the clear wound VAC dressing.  The clear part of the wound VAC has some yellowish material underneath.  She has some general tenderness in her abdomen without any guarding or rebound.  There is some foul odor when she was  undressing to show me the abdominal wound. SKIN: No rash or cyanosis; warm and well-perfused EXTR: Bilateral lower extremity edema to the upper two thirds of her shins. PSYCH: Mood and affect euthymic, normal rate and volume of speech NEURO: Awake, alert, no focal deficits grossly, normal speech   ASSESSMENT/PLAN:  Concern for infection of the abdominal wound: Patient continues to have high fevers despite starting doxycycline as an outpatient.  Direct admission for IV antibiotics discussed with Dr. Mingo Amber and inpatient team.  Vitals are currently stable.Smiley Houseman, MD PGY [place]

## 2017-09-09 NOTE — Progress Notes (Signed)
Pharmacy Antibiotic Note  Jacqueline Goodman is a 67 y.o. female admitted on 09/09/2017 with wound infection.  Pharmacy has been consulted for Vancomycin / Zosyn dosing.  Ongoing issues with abdominal wound with multiple debridements, was taking doxycyline as an outpateint  Plan: Zosyn 3.375 grams iv Q 8 hours - 4 hr infusion Vancomycin 2 grams iv x 1 dose now then 1000 mg iv Q 12 hours Stopped doxycyline Follow up Scr, cultures, LOT     Temp (24hrs), Avg:98.3 F (36.8 C), Min:98.1 F (36.7 C), Max:98.4 F (36.9 C)  Recent Labs  Lab 09/04/17 2048 09/04/17 2123 09/04/17 2231  WBC 11.4*  --   --   CREATININE 0.87  --   --   LATICACIDVEN  --  2.12* 1.45    Estimated Creatinine Clearance: 80.3 mL/min (by C-G formula based on SCr of 0.87 mg/dL).    Allergies  Allergen Reactions  . Morphine And Related Other (See Comments)    "says doesn't work"  . Tape Rash    Breaks the skin down     Thank you for allowing pharmacy to be a part of this patient's care. Anette Guarneri, PharmD [phone] 09/09/2017 5:43 PM

## 2017-09-09 NOTE — Progress Notes (Addendum)
11 Pt came as direct admit from a Dr's office. A&O x4.  Assisted to bed, slow unsteady gait noted. Pt came in with wound vac to abd wound, dressing dry and intact.  1720 Blood sugar 55, pt is alert, asymptomatic. 4oz of juice given. Blood sugar after 15 min, 74. Dinner served. Pt with poor appetite.

## 2017-09-09 NOTE — Progress Notes (Signed)
[facility] Attending Admission Note: HPI:  67 yo F with recent "tummy tuck" surgery about 1 month ago.  Has had several complications stemming from surgery.  With poor wound healing due to diabetes.  CBGs have been better recently.  Known DM2, HTN, HLD who presents with "flu like symptoms."  She has been having fever and chill plus nausea at home.  She was seen in the emergency department a few days ago and was having vomiting at that time.  She was offered admission for worsening status and concern for possible secondary infection and inability to tolerate p.o. but she declined admission.  She was started on doxycycline.  Since that time she is continued to have nausea plus fatigue plus generalized malaise.  Subjective fevers and chills.  Daughter is present and states she continues to decline.  On my exam the patient is sitting in a wheelchair.  She is pale and disheveled appearing.  Looks as if she feels unwell.  Conjunctivae are pale.  Dry mucous membranes.  She is breathing in a shallow rapid rate.  Her heart is regular rate and rhythm.  Abdominal binder in place.  She has +2 lower extremity edema to her mid shins.  She is fully alert and oriented.  Moving all extremities well and symmetrically.  Impression/plan: 1.  Abdominal cellulitis: -Foul-smelling drainage from wound VAC.  The room itself has a foul odor when you enter coming from her wound VAC. -She does not seem to be doing well on the oral doxycycline. -She is breathing in a rapid rate but she is fully mentating well so I believe sepsis is much less likely. -She needs to be admitted for IV antibiotics and further observation. -She recently had a debridement of her wound. - Nauseous and doesn't want to take PO.  Start IVF at repletion rate as she appears dehydrated today.  Anti-emetics PRN.  - Patient also with flu-like symptoms.  Could be contributing to increased RR, etc.  Would obtain flu swab to ensure this isn't complicating the picture.    2.  DM2: - continue home insulin regimen.  CBGs have been better.   - Need good CBG control for wound healing.  3.  Wound dehiscence: - will likely need wound center referral on discharge.  - recent CT abdomen shows no fluid collection.  If fevers persist, would re-image abdomen  4.  Disposition:  - patient hesitant about admission as she is "very tired and just wants to sleep."   - Able to discuss risks/benefits of hospitalization.  Daughter is concerned over degree to which her mother has declined  - consult PT/OT   I will sign resident H&P when complete.   Alveda Reasons, MD 09/09/2017 4:13 PM

## 2017-09-10 ENCOUNTER — Encounter (HOSPITAL_COMMUNITY): Payer: Self-pay | Admitting: General Practice

## 2017-09-10 DIAGNOSIS — T8130XA Disruption of wound, unspecified, initial encounter: Secondary | ICD-10-CM

## 2017-09-10 DIAGNOSIS — E1169 Type 2 diabetes mellitus with other specified complication: Secondary | ICD-10-CM

## 2017-09-10 DIAGNOSIS — E669 Obesity, unspecified: Secondary | ICD-10-CM

## 2017-09-10 DIAGNOSIS — J1100 Influenza due to unidentified influenza virus with unspecified type of pneumonia: Secondary | ICD-10-CM

## 2017-09-10 DIAGNOSIS — D638 Anemia in other chronic diseases classified elsewhere: Secondary | ICD-10-CM

## 2017-09-10 LAB — STREP PNEUMONIAE URINARY ANTIGEN: Strep Pneumo Urinary Antigen: NEGATIVE

## 2017-09-10 LAB — PROCALCITONIN: Procalcitonin: <0.1 ng/mL

## 2017-09-10 LAB — GLUCOSE, CAPILLARY
Glucose-Capillary: 101 mg/dL — ABNORMAL HIGH (ref 65–99)
Glucose-Capillary: 113 mg/dL — ABNORMAL HIGH (ref 65–99)

## 2017-09-10 LAB — BASIC METABOLIC PANEL
Anion gap: 9 (ref 5–15)
BUN: 17 mg/dL (ref 6–20)
CO2: 21 mmol/L — ABNORMAL LOW (ref 22–32)
Calcium: 7.7 mg/dL — ABNORMAL LOW (ref 8.9–10.3)
Chloride: 100 mmol/L — ABNORMAL LOW (ref 101–111)
Creatinine, Ser: 0.68 mg/dL (ref 0.44–1.00)
GFR calc Af Amer: >60 mL/min (ref >60)
GFR calc non Af Amer: >60 mL/min (ref >60)
Glucose, Bld: 105 mg/dL — ABNORMAL HIGH (ref 65–99)
Potassium: 3.9 mmol/L (ref 3.5–5.1)
Sodium: 130 mmol/L — ABNORMAL LOW (ref 135–145)

## 2017-09-10 LAB — CBC
HCT: 26.7 % — ABNORMAL LOW (ref 36.0–46.0)
Hemoglobin: 8.7 g/dL — ABNORMAL LOW (ref 12.0–15.0)
MCH: 26.6 pg (ref 26.0–34.0)
MCHC: 32.6 g/dL (ref 30.0–36.0)
MCV: 81.7 fL (ref 78.0–100.0)
Platelets: 320 10*3/uL (ref 150–400)
RBC: 3.27 MIL/uL — ABNORMAL LOW (ref 3.87–5.11)
RDW: 13.9 % (ref 11.5–15.5)
WBC: 6.3 10*3/uL (ref 4.0–10.5)

## 2017-09-10 MED ORDER — OSELTAMIVIR PHOSPHATE 75 MG PO CAPS
75.0000 mg | ORAL_CAPSULE | Freq: Two times a day (BID) | ORAL | Status: DC
  Administered 2017-09-10 – 2017-09-13 (×7): 75 mg via ORAL
  Filled 2017-09-10 (×7): qty 1

## 2017-09-10 MED ORDER — WHITE PETROLATUM EX OINT
TOPICAL_OINTMENT | CUTANEOUS | Status: AC
  Administered 2017-09-10: 16:00:00
  Filled 2017-09-10: qty 28.35

## 2017-09-10 MED ORDER — ENOXAPARIN SODIUM 60 MG/0.6ML ~~LOC~~ SOLN
50.0000 mg | SUBCUTANEOUS | Status: DC
  Administered 2017-09-10 – 2017-09-12 (×3): 50 mg via SUBCUTANEOUS
  Filled 2017-09-10 (×3): qty 0.6

## 2017-09-10 NOTE — Consult Note (Signed)
Asked to see Jacqueline Goodman in consult regarding her abdominal wound. The patient had been to the ER last week for evaluation of low grade fever, chills and malaise but was sent home. She presented to the [facility] center yesterday and was admitted for flu symptoms and possible abdominal wound cellulitis. The patient tested positive for the flu and is on respiratory precautions. The patient is feeling better but tired. FSBS have been 100-120 today. The patient complains of intermittent mild left sided mid-abdominal tenderness that has been present occasionally for a few weeks. She is examined with Melody of the Wound Care/Management team. Abdomen: Soft. Good bed of pink, healthy granulation tissue in areas where the VAC sponges have been used. Minimal areas of slightly devascularized fatty tissue and fibrinous exudate along the superior portion of the open wound above the umbilicus that is intermittent over an area of about 15 cm long. The area of undermining of the superior skin flaps is slightly smaller than was measured at the time of debridement and VAC dressing placement in the OR recently. Some of the skin edges are red in color but this is normal in the wound contraction process and there is no sign of infection, cellulitis or purulent drainage. The lower skin flaps have adhered down to the abdominal wall and there is no open space. The lateral aspects of the wound inferiorly have also contracted down in size and are closing. Impressions/Plan: The abdominal wound is healing well and slowly closing down with the VAC dressing. There are no signs of wound infection present. The slight redness at the skin edges is part of the normal healing process involved with a wound closing by secondary intention. It might just be that the drainage fluid in the [facility] has an odor because it is staying for a while.There was no indication to obtain a wound culture today based on our observations, but if things change it may  be appropriate. The patient tolerated limited conservative debridement of the devascularized fatty tissue and fibrinous exudate at the bedside. Melody agreed with my assessment so we will continue the [facility] dressing system while she is recovering from the flu. Melody will get her back on a MWF schedule for the dressing changes which at this point will continue when the patient is discharged home. I will plan to see the patient on a PRN basis as the medical team will be managing the flu and her other medical diagnoses. Please call me to see the patient before she is discharged to get a baseline on her wound. Thank you for taking care of Jacqueline Goodman and asking me to participate in her care.

## 2017-09-10 NOTE — Care Management Note (Signed)
Case Management Note  Patient Details  Name: Jacqueline Goodman MRN: [id number] Date of Birth: 02-28-1951  Subjective/Objective:                    Action/Plan:  Patient from home with [facility] RN/PT and VAC , await PT/OT recommendations and wound care  Expected Discharge Date:                  Expected Discharge Plan:  [place]  In-House Referral:     Discharge planning Services  CM Consult  Post Acute Care Choice:    Choice offered to:     DME Arranged:    DME Agency:     HH Arranged:    HH Agency:     Status of Service:  In process, will continue to follow  If discussed at Long Length of Stay Meetings, dates discussed:    Additional Comments:  Marilu Favre, RN 09/10/2017, 1:46 PM

## 2017-09-10 NOTE — Consult Note (Signed)
[place] nurse scheduled to meet plastic surgeon at 1330 to assess abdominal wound.   Family and nurse made aware.  [place], [place], [place]

## 2017-09-10 NOTE — Progress Notes (Signed)
Inpatient Diabetes Program Recommendations  AACE/ADA: New Consensus Statement on Inpatient Glycemic Control (2015)  Target Ranges:  Prepandial:   less than 140 mg/dL      Peak postprandial:   less than 180 mg/dL (1-2 hours)      Critically ill patients:  140 - 180 mg/dL   Lab Results  Component Value Date   GLUCAP 101 (H) 09/10/2017   HGBA1C 6.1 08/23/2017    Review of Glycemic Control  Diabetes history: DM2 Outpatient Diabetes medications: Amaryl 2 mg QD Current orders for Inpatient glycemic control:   HgbA1C - 6.1% - appears to have good control at home.  Inpatient Diabetes Program Recommendations:     Decrease Novolog to 0-9 units tidwc.  Continue to follow.  Thank you. Lorenda Peck, RD, LDN, CDE Inpatient Diabetes Coordinator [phone]

## 2017-09-10 NOTE — Progress Notes (Signed)
OT Cancellation Note  Patient Details Name: Jacqueline Goodman MRN: [id number] DOB: [birth year]   Cancelled Treatment:    Reason Eval/Treat Not Completed: Fatigue/lethargy limiting ability to participate. Pt reporting pain and fatigue declining participation in OT session today. Will check back in am.  Norman Herrlich, MS OTR/L  Pager: [place] 09/10/2017, 12:34 PM

## 2017-09-10 NOTE — Consult Note (Signed)
[place] nurse consulted for abdominal wound, this [place] familiar with this patient from her previous admission and surgical debridements.  Reviewed notes, I have written orders to have NPWT VAC dressing removed and saline moist gauze placed until we can arrange for plastics and WOC nurse to assess wound. Spoke with bedside nurse to request same.   [place], [place], [place]

## 2017-09-10 NOTE — Progress Notes (Signed)
PT Cancellation Note  Patient Details Name: Jacqueline Goodman MRN: [id number] DOB: [birth year]   Cancelled Treatment:    Reason Eval/Treat Not Completed: Fatigue/lethargy limiting ability to participate. Pt very fatigued today but wants to attempt getting up to chair tomorrow morning.    [place] 09/10/2017, 11:29 AM

## 2017-09-10 NOTE — Progress Notes (Signed)
1000 Old hemovac dressing removed, moist gauze dressing applied as ordered. 59 Dr Nathanial Rancher and Melody McCook RN came in and placed a new wound vac dressing.

## 2017-09-10 NOTE — Consult Note (Signed)
[place] Nurse wound consult note Reason for Consult: surgical wound, treated with NPWT dressings. Initiated during last admission by this [place] nurse. Met plastic surgeon at bedside for wound assessment. Wound type: surgical Pressure Injury POA: NA Measurement: 40cm x 12cm x 3.0cm with undermining at 1100 o'clock 15cm, 12 o'clock 12cm, 1 o'clock 8cm  Wound bed: clean, pink, granulation tissue, some fatty necrosis along the proximal wound edges from 10-2 o'clock that Dr. Wallene Dales debrided today at the bedside   Drainage (amount, consistency, odor) moderate, serosanguinous , no odor.  It should be noted that odor with NPWT dressings is sometimes common especially between a Friday and Monday dressing change. One should assess for odor after the dressing has been removed and the wound has been cleansed.  Periwound: intact  Plastic surgeon has used surgical marker today to outline the extent of the undermining to aid nursing staff in the needed packing with white foam Plastic surgeon has removed 2 remaining retention sutures on the lateral edges of the wound today. Dressing procedure/placement/frequency:  3 pc of white foam removed after saline moist dressing removed from bedside nursing change this am.  Nurse reported she did not remove the white foam.  3 pc of white foam used to fill the undermining proximally. With large portion of while foam left outside undermined section to allow for appropriate removal at dressing changes. Umbilicus covered with oil emulsion dressing. 3 pc of black foam used to fill remainder of the wound bed.  Sealed with drape with assistance of bedside nurse. Seal obtained at 141mHG. Patient tolerated, received PO pain meds just prior the dressing change per her request.   WAu Gresnurse will plan to change NPWT dressing again tomorrow to allow patient to be placed back on M/W/F dressing change schedule. Plastic surgeon is in agreement with this plan.  [place]  [place]

## 2017-09-10 NOTE — Progress Notes (Addendum)
Family Medicine Teaching Service Daily Progress Note Intern Pager: [phone]  Patient name: Jacqueline Goodman Medical record number: [id number] Date of birth: [birth year] Age: 67 y.o. Gender: female  Primary Care Provider: Lind Covert, MD Consultants: Surgery Code Status: Full  Pt Overview and Major Events to Date:  Jacqueline Goodman is a 67 y.o. female presenting with fevers and chills concerning for a wound infection. PMH is significant for tummy tuck on 07/22/17, T2DM, HTN, HLD.  Assessment and Plan:  Abdominal cellulitis:  Patient with fevers, chills, nausea presenting with foul-smelling abdominal wound after tummy tuck on 07/22/17.  She has previously been on Vancomycin and Zosyn in mid December for purulent drainage and wound dehiscence with I&D on 12/20.  Patient underwent debridment on 1/7 and was subsequently seen on 1/9 in the ED with concern for abdominal wound infection. at that time, patient declined admission for IV abx and was d/c with doxycycline. Has been on oral doxycycline since without resolution of her symptoms.   - follow blood cultures - continue Vancomycin and Zosyn, dosing per pharmacy - mIVF @ 125 ml/hr - consult wound care who will talk with patient's plastic surgeon - consider abdominal CT in the am if patient continues to deteriorate - home zofran for nausea - home percocet 5-325 mg Q4H PRN for pain  Cough, SOB: Patient with cough, shortness of breath, and recent history of fever and chills. Positive for influenza A. Chest XR showed lower lobe infiltrates concerning for pneumonia.  She is being covered with broad spectrum antibiotics. - Tamiflu - sputum cultures - Legionella and strep pneumonia urine antigen pending - continue broad spectrum antibiotics (see above)  T2DM: Last A1c 6.1 on 08/23/17.  On Amaryl at home.  Plastic surgeon recommended CBGs <150 during patient's last admission. Blood sugars have been low recently.  - stop moderate SSI, will  restart if blood sugars increase. - restart home Amyril  - will add Lantus, meal coverage, HS coverage as needed depending on patient's insulin requirements  HTN: Currently normotensive at 130/62.  Takes losartan 25 mg daily at home. - continue losartan  HLD: Last lipid panel with LDL 127, total cholesterol 206.   - consider addition of statin on discharge  FEN/GI: mIVF @ 125 ml/hr, carb modified diet Prophylaxis: lovenox  Disposition: SFN vs. Home  Subjective:  Did not sleep well last night and was unhappy with nurse overnight. Is still not feeling very well.    Objective: Temp:  [98.1 F (36.7 C)-99.5 F (37.5 C)] 99.5 F (37.5 C) (01/15 0600) Pulse Rate:  [74-80] 74 (01/15 0600) Resp:  [16-18] 18 (01/15 0600) BP: (112-136)/(56-62) 130/62 (01/15 0600) SpO2:  [97 %-100 %] 97 % (01/15 0600)  Physical Exam: General: Unwell and unhappy appearing Cardiovascular: Normal S1 and S2, no murmurs, rubs or gallops Respiratory: clear to auscultation in upper lobes, but decreased breath sounds in lower lobes   Laboratory: Recent Labs  Lab 09/04/17 2048 09/09/17 1704 09/10/17 0728  WBC 11.4* 7.3 6.3  HGB 9.9* 10.0* 8.7*  HCT 30.6* 31.1* 26.7*  PLT 452* 367 320   Recent Labs  Lab 09/04/17 2048 09/09/17 1704  NA 130* 130*  K 4.7 4.3  CL 99* 98*  CO2 21* 20*  BUN 35* 27*  CREATININE 0.87 0.76  CALCIUM 8.4* 8.2*  PROT 6.7 6.1*  BILITOT 0.6 0.5  ALKPHOS 83 72  ALT 19 24  AST 26 46*  GLUCOSE 252* 59*    Imaging/Diagnostic Tests: Chest XR: patchy  opacity throughout both lungs, most prominent in lower lobes, with concern for pneumonia.   Landry Dyke, Medical Student 09/10/2017, 8:17 AM [place] Intern pager: [phone], text pages welcome  RESIDENT ADDENDUM  I have separately seen and examined the patient. I have discussed the findings and exam with the medical student and agree with the above note, which I have edited appropriately. I  helped develop the management plan that is described in the student's note, and I agree with the content.  Additionally I have outlined my exam and assessment/plan below:   67 year old female admitted with fevers and chills despite taking Doxycycline concern for wound infection as well as PNA given report of new onset cough. Patient s/p tummy tuck in November 2018 with two subsequent debridements.   PE:  Gen: pale, tired appearing female in NAD  CV: RRR. No murmurs appreciated.  Lungs: Decreased breath sounds in bases bilaterally. Normal WOB.  Abd: Abdominal binder in place. Foul smelling wound.   A/P:  Abdominal Cellulitis: Has failed outpatient Doxycyline. Wound care following, appreciate recs. Have removed wound vac and plan to discuss with plastic surgeon. Continue with Vancomycin and Doxycyline. Continue IVFs.  Cough/SOB: Flu A+. Start Tamiflu. CXR concerning for PNA. Given patient has required hospitalization, will presume bacterial PNA and treat as such.  T2DM: With recent low CBGs. Will hold insulin for now and monitor, restart as needed.  HTN: Normotensive at present. Continue home losartan.      Phill Myron, D.O. 09/10/2017, 2:33 PM PGY-2, [place]  I have interviewed and examined the patient.  I have discussed the case and verified the key findings with Dr. Juleen China and MS IV Landry Dyke.   I agree with their assessments and plans as documented in their progress note.  Lissa Morales, MD

## 2017-09-11 LAB — GLUCOSE, CAPILLARY
Glucose-Capillary: 95 mg/dL (ref 65–99)
Glucose-Capillary: 142 mg/dL — ABNORMAL HIGH (ref 65–99)
Glucose-Capillary: 118 mg/dL — ABNORMAL HIGH (ref 65–99)
Glucose-Capillary: 135 mg/dL — ABNORMAL HIGH (ref 65–99)

## 2017-09-11 LAB — BASIC METABOLIC PANEL
Anion gap: 7 (ref 5–15)
BUN: 20 mg/dL (ref 6–20)
CO2: 20 mmol/L — ABNORMAL LOW (ref 22–32)
Calcium: 7.7 mg/dL — ABNORMAL LOW (ref 8.9–10.3)
Chloride: 103 mmol/L (ref 101–111)
Creatinine, Ser: 0.7 mg/dL (ref 0.44–1.00)
GFR calc Af Amer: >60 mL/min (ref >60)
GFR calc non Af Amer: >60 mL/min (ref >60)
Glucose, Bld: 127 mg/dL — ABNORMAL HIGH (ref 65–99)
Potassium: 4 mmol/L (ref 3.5–5.1)
Sodium: 130 mmol/L — ABNORMAL LOW (ref 135–145)

## 2017-09-11 LAB — CBC
HCT: 25 % — ABNORMAL LOW (ref 36.0–46.0)
Hemoglobin: 8.2 g/dL — ABNORMAL LOW (ref 12.0–15.0)
MCH: 26.8 pg (ref 26.0–34.0)
MCHC: 32.8 g/dL (ref 30.0–36.0)
MCV: 81.7 fL (ref 78.0–100.0)
Platelets: 267 10*3/uL (ref 150–400)
RBC: 3.06 MIL/uL — ABNORMAL LOW (ref 3.87–5.11)
RDW: 14.2 % (ref 11.5–15.5)
WBC: 7 10*3/uL (ref 4.0–10.5)

## 2017-09-11 LAB — FERRITIN: Ferritin: 281 ng/mL (ref 11–307)

## 2017-09-11 LAB — IRON AND TIBC
Iron: 7 ug/dL — ABNORMAL LOW (ref 28–170)
Saturation Ratios: 4 % — ABNORMAL LOW (ref 10.4–31.8)
TIBC: 168 ug/dL — ABNORMAL LOW (ref 250–450)
UIBC: 161 ug/dL

## 2017-09-11 LAB — PROCALCITONIN: Procalcitonin: <0.1 ng/mL

## 2017-09-11 LAB — LEGIONELLA PNEUMOPHILA SEROGP 1 UR AG: L. pneumophila Serogp 1 Ur Ag: NEGATIVE

## 2017-09-11 NOTE — Progress Notes (Signed)
Family Medicine Teaching Service Daily Progress Note Intern Pager: [phone]  Patient name: Jacqueline Goodman Medical record number: [id number] Date of birth: [birth year] Age: 67 y.o. Gender: female  Primary Care Provider: Lind Covert, MD Consultants: Wound care Code Status: Full  Pt Overview and Major Events to Date:  Jacqueline Peaden Justiceis a 67 y.o.femalepresenting with fevers and chills concerning for a wound infection. PMH is significant fortummy tuck on 07/22/17, T2DM, HTN, HLD.  Assessment and Plan: Abdominal cellulitis:Patient with fevers, chills, nausea presenting with foul-smelling abdominal wound after tummy tuck on 07/22/17. She has previously been on Vancomycin and Zosyn in mid December for purulent drainage and wound dehiscence with I&D on 12/20. Patient underwent debridment on 1/7 and was subsequently seen on 1/9 in the ED with concern for abdominal wound infection. at that time, patient declined admission for IV abx and was d/c with doxycycline.Has been on oral doxycycline since without resolution of her symptoms.Plastic surgeon and wound consult nurse evaluated yesterday and determined that wound was not infected.  They did mild debridement and replaced wound vac.   - follow blood cultures - continue Vancomycin and Zosyn, dosing per pharmacy - mIVF @ 125 ml/hr - wound vac changes MWF - home zofran for nausea - home percocet 5-325 mg Q4H PRN for pain  Cough, IRJ:JOACZYS with cough, shortness of breath, and recent history of fever and chills. Positive for influenza A. Chest XR showed lower lobe infiltrates concerning for pneumonia.  She is being covered with broad spectrum antibiotics. WBC count continues to be normal.  She did have a fever last night - Tamiflu - sputum cultures - Legionella antigen pending - strep pneumonia urine antigen negative - continue broad spectrum antibiotics (see above) - incentive spirometry   Anemia:  Hemoglobin has been decreasing  from 10.0 on admission to 8.2 today.  MCV is low normal. Possible causes are dilutional from IV fluids, blood loss from wound vac, iron deficiency. - iron studies  T2DM:Last A1c 6.1 on 08/23/17. On Amaryl at home. Plastic surgeon recommended CBGs <150 during patient's last admission. Blood sugars have been low recently.  - stop moderate SSI, will restart if blood sugars increase. - restart home Amyril  - will add Lantus, meal coverage, HS coverage as needed depending on patient's insulin requirements  HTN: Blood pressure low this morning at 110/47. Takes losartan 25 mg daily at home. - hold losartan  AYT:KZSW lipid panel with LDL 127, total cholesterol 206.  - consider addition of statin on discharge  FEN/GI:mIVF @ 125 ml/hr, carb modified diet Prophylaxis:lovenox  Disposition: Home vs SNF  Subjective:  Jacqueline Goodman was up in chair this morning.  Slept a little more last night.  Overall doing okay.  Objective: Temp:  [99.2 F (37.3 C)-100.8 F (38.2 C)] 99.5 F (37.5 C) (01/16 0634) Pulse Rate:  [70-73] 70 (01/16 0634) Resp:  [18-20] 20 (01/16 0634) BP: (110-126)/(47-52) 110/47 (01/16 0634) SpO2:  [94 %-98 %] 94 % (01/16 1093) Physical Exam: General: More alert this morning, sitting up in chair. Cardiovascular: Normal S1 and S2, no murmurs, rubs or gallops. Respiratory: Crackles in bases of lungs Abdomen: Wound vac in place, some erythema around sponges  Laboratory: Recent Labs  Lab 09/09/17 1704 09/10/17 0728 09/11/17 0348  WBC 7.3 6.3 7.0  HGB 10.0* 8.7* 8.2*  HCT 31.1* 26.7* 25.0*  PLT 367 320 267   Recent Labs  Lab 09/04/17 2048 09/09/17 1704 09/10/17 0728 09/11/17 0348  NA 130* 130* 130* 130*  K  4.7 4.3 3.9 4.0  CL 99* 98* 100* 103  CO2 21* 20* 21* 20*  BUN 35* 27* 17 20  CREATININE 0.87 0.76 0.68 0.70  CALCIUM 8.4* 8.2* 7.7* 7.7*  PROT 6.7 6.1*  --   --   BILITOT 0.6 0.5  --   --   ALKPHOS 83 72  --   --   ALT 19 24  --   --   AST 26  46*  --   --   GLUCOSE 252* 59* 105* 127*    Imaging/Diagnostic Tests: None  Landry Dyke, Medical Student 09/11/2017, 9:19 AM [place] Intern pager: [phone], text pages welcome   RESIDENT ADDENDUM  I have separately seen and examined the patient. I have discussed the findings and exam with the medical student and agree with the above note, which I have edited appropriately. I helped develop the management plan that is described in the student's note, and I agree with the content.  Additionally I have outlined my exam and assessment/plan below:   67 year old female admitted with fevers and chills despite taking Doxycycline outpatient. S/p tummy tuck in November 2018 with two subsequent debridements. Patient reports that she is feeling much better today. SOB has improved and she is not coughing up significant amount of sputum. Complains about food but appetite has improved and she has good PO intake. Hesitant to work with PT.    PE:  Gen: female sitting in chair in NAD  CV: RRR. No murmurs present.  Lungs: A few scattered rhonchi that clear easily with good air movement. Normal WOB.  Abd: Wound vac in place. Surrounding erythema on visualized skin.    A/P:  Abdominal Wound: S/p tummy tuck. Evaluated by wound care and plastic surgeon who found no signs of active infection and evidence of good wound healing. Bedside debridement performed yesterday. Plan for wound care MWF.  Cough/SOB: Flu A+. Tamiflu (1/15- ). CXR concerning for PNA. Given patient has required hospitalization, will presume bacterial PNA and treat as such. Currently on Vancomycin and Zosyn. Sputum culture pending. Will plan to narrow abx tomorrow. Fever curve improved. Turn down IVFs and place to d/c later today if continues to have good PO intake.  T2DM: With recent low CBGs. Will hold insulin for now and monitor, restart as needed.  HTN: Soft BPs overnight. Hold Losartan.  Anemia:  HgB  decreased from 10.0 on admission to 8.2 today.  MCV is low normal. Possible causes are dilutional from IV fluids, blood loss from wound vac, iron deficiency. Will check Fe studies and continue to monitor.     Phill Myron, D.O. 09/11/2017, 12:09 PM PGY-3, [place]

## 2017-09-11 NOTE — Care Management Note (Signed)
Case Management Note  Patient Details  Name: Jacqueline Goodman MRN: [id number] Date of Birth: Nov 26, 1950  Subjective/Objective:                    Action/Plan:  Patient already has home [facility]  Expected Discharge Date:                  Expected Discharge Plan:  [place]  In-House Referral:     Discharge planning Services  CM Consult  Post Acute Care Choice:  Home Health Choice offered to:     DME Arranged:    DME Agency:  NA  HH Arranged:  RN [place] Agency:  [place]  Status of Service:  Completed, signed off  If discussed at [place] of Stay Meetings, dates discussed:    Additional Comments:  Marilu Favre, RN 09/11/2017, 10:32 AM

## 2017-09-11 NOTE — Consult Note (Signed)
[place] Nurse wound follow up Reason for Consult: Wound type: surgical Pressure Injury POA: NA Measurement: 40cm x 12cm x 3.0cm with undermining at 1100 o'clock 15cm, 12 o'clock 12cm, 1 o'clock 8cm  Wound bed: clean, pink, granulation tissue Drainage (amount, consistency, odor) moderate, serosanguinous Dressing procedure/placement/frequency:  3 pc of white foam used to fill the undermining proximally. With large portion of while foam left outside undermined section to allow for appropriate removal at dressing changes. Umbilicus covered with oil emulsion dressing. 4 pc of black foam used to fill remainder of the wound bed.  Sealed with drape with assistance of bedside nurse. Seal obtained at 164mmHG. Patient tolerated, received PO pain meds just prior the dressing change per her request.    Will plan to change dressing M/W/F.    [place], [place], [place]

## 2017-09-11 NOTE — Evaluation (Signed)
Occupational Therapy Evaluation Patient Details Name: Jacqueline Goodman MRN: [id number] DOB: 1950/10/24 Today's Date: 09/11/2017    History of Present Illness 67 yo F with recent "tummy tuck" surgery about 1 month ago.  Has had several complications stemming from surgery.  With poor wound healing due to diabetes.  CBGs have been better recently.  Known DM2, HTN, HLD who presents with "flu like symptoms."  She has been having fever and chill plus nausea at home.  She was seen in the emergency department a few days ago and was having vomiting at that time.  She was offered admission for worsening status and concern for possible secondary infection and inability to tolerate p.o. but she declined admission.  She was started on doxycycline.    Clinical Impression   This 67 y/o F presents with the above. Pt lives with spouse, has most recently been receiving assist from daughter for bathing and LB dressing ADLs. Pt presents with decreased activity tolerance, generalized weakness, decreased functional performance. Pt sat EOB during session, completing grooming ADLs with setup assist. Requires MinA for stand pivot transfers and toileting ADLs; currently requires maxA for LB ADLs. Pt's daughter present during session and reporting will be able to assist after discharge for ADLs PRN. Recommend Pt receive additional HHOT services after discharge to maximize Pt's safety and independence with ADLs and mobility. Will continue to follow acutely to progress Pt towards PLOF.     Follow Up Recommendations  Home health OT;Supervision/Assistance - 24 hour    Equipment Recommendations  3 in 1 bedside commode           Precautions / Restrictions Precautions Precautions: Fall Precaution Comments: does not want to use RW due to fall x2 with RW; abdominal wound with wound vac  Restrictions Weight Bearing Restrictions: No      Mobility Bed Mobility   Bed Mobility: Supine to Sit;Sit to Supine     Supine to  sit: Min guard Sit to supine: Min assist   General bed mobility comments: increased time/effort to transition to sitting EOB but no physical assist required, use of bedrails to complete; assist for LLE when returning to supine in bed   Transfers Overall transfer level: Needs assistance Equipment used: None Transfers: Sit to/from Stand Sit to Stand: Min assist         General transfer comment: Assist for power-up to full stand as well as for steadying assistance. HHA provided for support in standing     Balance Overall balance assessment: Needs assistance Sitting-balance support: No upper extremity supported;Feet supported Sitting balance-Leahy Scale: Good     Standing balance support: During functional activity;Single extremity supported Standing balance-Leahy Scale: Fair Standing balance comment: able to stand static briefly without UE support; utilizing HHA to complete stand pivot to [facility]                            ADL either performed or assessed with clinical judgement   ADL Overall ADL's : Needs assistance/impaired Eating/Feeding: Modified independent;Sitting   Grooming: Set up;Sitting Grooming Details (indicate cue type and reason): Pt applying lotion to face seated EOB  Upper Body Bathing: Minimal assistance;Sitting   Lower Body Bathing: Moderate assistance;Sit to/from stand   Upper Body Dressing : Minimal assistance;Sitting   Lower Body Dressing: Sit to/from stand;Maximal assistance   Toilet Transfer: Minimal assistance;Stand-pivot;BSC   Toileting- Clothing Manipulation and Hygiene: Minimal assistance;Sit to/from stand Toileting - Clothing Manipulation Details (indicate cue type  and reason): Pt completing peri-care in standing with Min steadying assist provided      Functional mobility during ADLs: Minimal assistance General ADL Comments: Pt sat EOB during session, completion grooming ADLs with setup; completed stand pivot EOB<>BSC; RN arriving to  change wound vac therefore Pt returning to supine in bed end of session                          Pertinent Vitals/Pain Pain Assessment: Faces Faces Pain Scale: Hurts little more Pain Location: abdomen Pain Descriptors / Indicators: Sore Pain Intervention(s): Limited activity within patient's tolerance;Monitored during session;Repositioned     Hand Dominance Right   Extremity/Trunk Assessment Upper Extremity Assessment Upper Extremity Assessment: Generalized weakness   Lower Extremity Assessment Lower Extremity Assessment: Defer to PT evaluation   Cervical / Trunk Assessment Cervical / Trunk Assessment: (wound vac abdomen )   Communication Communication Communication: No difficulties   Cognition Arousal/Alertness: Awake/alert Behavior During Therapy: WFL for tasks assessed/performed Overall Cognitive Status: Within Functional Limits for tasks assessed                                     General Comments  Pt's daughter present during session                Home Living Family/patient expects to be discharged to:: Private residence Living Arrangements: Spouse/significant other;Children Available Help at Discharge: Family;Available 24 hours/day Type of Home: House Home Access: Stairs to enter [employer] of Steps: 3 Entrance Stairs-Rails: Right;Left;Can reach both Home Layout: One level     Bathroom Shower/Tub: Teacher, early years/pre: Handicapped height     Home Equipment: Environmental consultant - 2 wheels;Cane - single point;Shower seat          Prior Functioning/Environment Level of Independence: Needs assistance  Gait / Transfers Assistance Needed: husband was pushing pt in office chair because she was dizzy and fell 2 x with walker ADL's / Homemaking Assistance Needed: Daughter does pt sponge bath and helps her with socks (she has only been wearing socks and night gown since tummy tuck sx)            OT Problem List:  Decreased strength;Impaired balance (sitting and/or standing);Obesity;Decreased activity tolerance      OT Treatment/Interventions: Self-care/ADL training;Balance training;DME and/or AE instruction;Patient/family education;Energy conservation;Therapeutic activities    OT Goals(Current goals can be found in the care plan section) Acute Rehab OT Goals Patient Stated Goal: to go home OT Goal Formulation: With patient/family Time For Goal Achievement: 09/25/17 Potential to Achieve Goals: Good  OT Frequency: Min 2X/week                             AM-PAC PT "6 Clicks" Daily Activity     Outcome Measure Help from another person eating meals?: None Help from another person taking care of personal grooming?: A Little Help from another person toileting, which includes using toliet, bedpan, or urinal?: A Little Help from another person bathing (including washing, rinsing, drying)?: A Little Help from another person to put on and taking off regular upper body clothing?: A Little Help from another person to put on and taking off regular lower body clothing?: A Lot 6 Click Score: 18   End of Session Nurse Communication: Mobility status  Activity Tolerance: Patient tolerated treatment well Patient  left: in bed;with call bell/phone within reach;with family/visitor present;with nursing/sitter in room(wound care nurse present to check wound vac )  OT Visit Diagnosis: Unsteadiness on feet (R26.81);Muscle weakness (generalized) (M62.81)                Time: 1610-9604 OT Time Calculation (min): 21 min Charges:  OT General Charges $OT Visit: 1 Visit OT Evaluation $OT Eval Moderate Complexity: 1 Mod G-Codes:     Lou Cal, OT Pager [phone] 09/11/2017   Raymondo Band 09/11/2017, 3:51 PM

## 2017-09-11 NOTE — Evaluation (Signed)
Physical Therapy Evaluation Patient Details Name: Jacqueline Goodman MRN: [id number] DOB: 1951/03/17 Today's Date: 09/11/2017   History of Present Illness  67 yo F with recent "tummy tuck" surgery about 1 month ago.  Has had several complications stemming from surgery.  With poor wound healing due to diabetes.  CBGs have been better recently.  Known DM2, HTN, HLD who presents with "flu like symptoms."  She has been having fever and chill plus nausea at home.  She was seen in the emergency department a few days ago and was having vomiting at that time.  She was offered admission for worsening status and concern for possible secondary infection and inability to tolerate p.o. but she declined admission.  She was started on doxycycline.   Clinical Impression  Pt admitted with above diagnosis. Pt currently with functional limitations due to the deficits listed below (see PT Problem List). At the time of PT eval pt was able to perform transfers and ambulation with gross min assist for balance support and safety. Pt declining RW use, however feel it would be beneficial overall for energy conservation and safety. Pt and husband wish to return home at d/c, however if pt does not progress with PT next session, may want to consider a higher level of care. Pt will benefit from skilled PT to increase their independence and safety with mobility to allow discharge to the venue listed below.       Follow Up Recommendations Home health PT;Supervision for mobility/OOB    Equipment Recommendations  None recommended by PT    Recommendations for Other Services       Precautions / Restrictions Precautions Precautions: Fall Precaution Comments: does not want to use RW due to fall x2 with RW; abdominal wound with wound vac Restrictions Weight Bearing Restrictions: No      Mobility  Bed Mobility               General bed mobility comments: Pt received sitting up in chair. Reports she and her husband got her  up without assistance this morning.   Transfers Overall transfer level: Needs assistance Equipment used: None Transfers: Sit to/from Stand Sit to Stand: Min assist         General transfer comment: Assist for power-up to full stand as well as for steadying assistance. Once standing, pt asking husband to come close so she could hold his arm for support.   Ambulation/Gait Ambulation/Gait assistance: Min assist Ambulation Distance (Feet): 100 Feet Assistive device: 1 person hand held assist Gait Pattern/deviations: Step-through pattern;Decreased stride length;Trunk flexed Gait velocity: decreased Gait velocity interpretation: Below normal speed for age/gender General Gait Details: Slow and unsteady. Pt with DOE and appeared to be breathing heavily, however when asked about perceived exertion, pt reports she is feeling "fine".   Stairs            Wheelchair Mobility    Modified Rankin (Stroke Patients Only)       Balance Overall balance assessment: Needs assistance Sitting-balance support: No upper extremity supported;Feet supported Sitting balance-Leahy Scale: Good     Standing balance support: During functional activity Standing balance-Leahy Scale: Fair Standing balance comment: able to stand static without UE support; holds onto surface or reaches for hand with mobility (does not want to use RW due to feels using one made her fall 2 times in past)  Pertinent Vitals/Pain Pain Assessment: Faces Faces Pain Scale: Hurts even more Pain Location: abdomen Pain Descriptors / Indicators: Sore Pain Intervention(s): Limited activity within patient's tolerance;Monitored during session;Repositioned    Home Living Family/patient expects to be discharged to:: Private residence Living Arrangements: Spouse/significant other;Children Available Help at Discharge: Family;Available 24 hours/day Type of Home: House Home Access: Stairs to  enter Entrance Stairs-Rails: Right;Left;Can reach both Entrance Stairs-Number of Steps: 3 Home Layout: One level Home Equipment: Walker - 2 wheels;Cane - single point;Shower seat      Prior Function Level of Independence: Needs assistance   Gait / Transfers Assistance Needed: husband was pushing pt in office chair because she was dizzy and fell 2 x with walker  ADL's / Homemaking Assistance Needed: Daughter does pt sponge bath and helps her with socks (she has only been wearing socks and night gown since tummy tuck sx        Hand Dominance   Dominant Hand: Right    Extremity/Trunk Assessment   Upper Extremity Assessment Upper Extremity Assessment: Generalized weakness    Lower Extremity Assessment Lower Extremity Assessment: Generalized weakness    Cervical / Trunk Assessment Cervical / Trunk Assessment: (Wound VAC abdomen)  Communication   Communication: No difficulties  Cognition Arousal/Alertness: Awake/alert Behavior During Therapy: WFL for tasks assessed/performed Overall Cognitive Status: Within Functional Limits for tasks assessed                                        General Comments      Exercises     Assessment/Plan    PT Assessment Patient needs continued PT services  PT Problem List Decreased activity tolerance;Decreased balance;Decreased mobility;Decreased knowledge of use of DME;Decreased safety awareness;Decreased knowledge of precautions;Obesity;Decreased skin integrity;Pain       PT Treatment Interventions DME instruction;Gait training;Functional mobility training;Therapeutic activities;Therapeutic exercise;Balance training;Patient/family education    PT Goals (Current goals can be found in the Care Plan section)  Acute Rehab PT Goals Patient Stated Goal: to go home PT Goal Formulation: With patient Time For Goal Achievement: 09/25/17 Potential to Achieve Goals: Good    Frequency Min 3X/week   Barriers to discharge         Co-evaluation               AM-PAC PT "6 Clicks" Daily Activity  Outcome Measure Difficulty turning over in bed (including adjusting bedclothes, sheets and blankets)?: A Little Difficulty moving from lying on back to sitting on the side of the bed? : A Little Difficulty sitting down on and standing up from a chair with arms (e.g., wheelchair, bedside commode, etc,.)?: A Little Help needed moving to and from a bed to chair (including a wheelchair)?: A Little Help needed walking in hospital room?: A Little Help needed climbing 3-5 steps with a railing? : A Lot 6 Click Score: 17    End of Session Equipment Utilized During Treatment: Gait belt Activity Tolerance: Patient tolerated treatment well Patient left: in chair;with call bell/phone within reach;with family/visitor present Nurse Communication: Mobility status PT Visit Diagnosis: Muscle weakness (generalized) (M62.81);Unsteadiness on feet (R26.81)    Time: 3785-8850 PT Time Calculation (min) (ACUTE ONLY): 27 min   Charges:   PT Evaluation $PT Eval Moderate Complexity: 1 Mod PT Treatments $Gait Training: 8-22 mins   PT G Codes:        Rolinda Roan, PT, DPT Acute Rehabilitation Services Pager: [phone]  Thelma Comp 09/11/2017, 2:28 PM

## 2017-09-12 DIAGNOSIS — D500 Iron deficiency anemia secondary to blood loss (chronic): Secondary | ICD-10-CM | POA: Diagnosis present

## 2017-09-12 DIAGNOSIS — D638 Anemia in other chronic diseases classified elsewhere: Secondary | ICD-10-CM | POA: Diagnosis present

## 2017-09-12 LAB — BASIC METABOLIC PANEL
Anion gap: 9 (ref 5–15)
BUN: 17 mg/dL (ref 6–20)
CO2: 17 mmol/L — ABNORMAL LOW (ref 22–32)
Calcium: 7.6 mg/dL — ABNORMAL LOW (ref 8.9–10.3)
Chloride: 105 mmol/L (ref 101–111)
Creatinine, Ser: 0.68 mg/dL (ref 0.44–1.00)
GFR calc Af Amer: >60 mL/min (ref >60)
GFR calc non Af Amer: >60 mL/min (ref >60)
Glucose, Bld: 160 mg/dL — ABNORMAL HIGH (ref 65–99)
Potassium: 4.1 mmol/L (ref 3.5–5.1)
Sodium: 131 mmol/L — ABNORMAL LOW (ref 135–145)

## 2017-09-12 LAB — CBC
HCT: 23.5 % — ABNORMAL LOW (ref 36.0–46.0)
Hemoglobin: 7.5 g/dL — ABNORMAL LOW (ref 12.0–15.0)
MCH: 26 pg (ref 26.0–34.0)
MCHC: 31.9 g/dL (ref 30.0–36.0)
MCV: 81.3 fL (ref 78.0–100.0)
Platelets: 238 10*3/uL (ref 150–400)
RBC: 2.89 MIL/uL — ABNORMAL LOW (ref 3.87–5.11)
RDW: 14 % (ref 11.5–15.5)
WBC: 6.5 10*3/uL (ref 4.0–10.5)

## 2017-09-12 LAB — GLUCOSE, CAPILLARY
Glucose-Capillary: 136 mg/dL — ABNORMAL HIGH (ref 65–99)
Glucose-Capillary: 160 mg/dL — ABNORMAL HIGH (ref 65–99)
Glucose-Capillary: 131 mg/dL — ABNORMAL HIGH (ref 65–99)
Glucose-Capillary: 132 mg/dL — ABNORMAL HIGH (ref 65–99)

## 2017-09-12 LAB — PROCALCITONIN: Procalcitonin: 0.13 ng/mL

## 2017-09-12 MED ORDER — GLIMEPIRIDE 2 MG PO TABS
2.0000 mg | ORAL_TABLET | Freq: Every day | ORAL | Status: DC
  Administered 2017-09-13: 2 mg via ORAL
  Filled 2017-09-12: qty 1

## 2017-09-12 MED ORDER — ADULT MULTIVITAMIN W/MINERALS CH
1.0000 | ORAL_TABLET | Freq: Every day | ORAL | Status: DC
  Administered 2017-09-12 – 2017-09-13 (×2): 1 via ORAL
  Filled 2017-09-12: qty 1

## 2017-09-12 MED ORDER — CLINDAMYCIN HCL 300 MG PO CAPS
600.0000 mg | ORAL_CAPSULE | Freq: Three times a day (TID) | ORAL | Status: DC
  Administered 2017-09-12 – 2017-09-13 (×3): 600 mg via ORAL
  Filled 2017-09-12 (×3): qty 2

## 2017-09-12 NOTE — Discharge Summary (Signed)
[facility] Discharge Summary  Patient name: Jacqueline Goodman Medical record number: [id number] Date of birth: 1950/09/01 Age: 67 y.o. Gender: female Date of Admission: 09/09/2017  Date of Discharge: 09/13/17 Admitting Physician: Blane Ohara McDiarmid, MD  Primary Care Provider: Lind Covert, MD Consultants: Wound care  Indication for Hospitalization: Fever  Discharge Diagnoses/Problem List:  Healing abdominal wound status post tummy tuck Influenza type A Community-acquired pneumonia Anemia chronic disease Type 2 diabetes mellitus Essential hypertension Hyperlipidemia  Disposition: Home with home health  Discharge Condition: Stable, improved  Discharge Exam:  General: obese, NAD with non-toxic appearance HEENT: normocephalic, atraumatic, moist mucous membranes Cardiovascular: regular rate and rhythm without murmurs, rubs, or gallops Lungs: clear to auscultation bilaterally with normal work of breathing on room air Abdomen: soft, minimal tenderness at wound site with intact dressings without erythema or purulence, non-distended, normoactive bowel sounds,  Skin: warm, dry, cap refill < 2 seconds Extremities: warm and well perfused, normal tone, no edema  Brief Hospital Course:  Healing abdominal wound: Status post tummy tuck on 07/02/2017.  Patient underwent debridement of healing wound on 09/02/2017.  She was seen and discharged from the ED on 09/04/2017 with doxycycline following refusal for admission for IV antibiotics.  Subsequently experiencing fevers while on doxycycline prompting ED visit.  There is concerned that she might have a persistent infection at the wound site with VAC.  Plastic surgery was consulted and evaluated wound without findings concerning for infection.  Continue wound changes endorse associated shortness of breath and cough and was found to test positive for influenza type A.  CXR also concerning for possible community-acquired  pneumonia.  Patient was adequately treated for infection.  It was thought that the fever was related to this and not the abdominal wound.  Patient was stable and cleared for discharge with home health RN to address dressing changes every Monday/Wednesday/Friday.  Patient was discharged on 1 additional dose of clindamycin treatment.   Issues for Follow Up:  1. Follow-up with plastic surgery as instructed.  We consulted home health nursing to visit to address dressings every Monday, Wednesday, and Friday.  Patient will continue wearing the wound VAC in the meantime. 2. Patient did have anemia with anemia of chronic disease.  Follow-up as appropriate.  Significant Procedures: None  Significant Labs and Imaging:  Recent Labs  Lab 09/11/17 0348 09/12/17 0554 09/13/17 0600  WBC 7.0 6.5 6.1  HGB 8.2* 7.5* 8.4*  HCT 25.0* 23.5* 26.0*  PLT 267 238 280   Recent Labs  Lab 09/09/17 1704 09/10/17 0728 09/11/17 0348 09/12/17 0554 09/13/17 0600  NA 130* 130* 130* 131* 135  K 4.3 3.9 4.0 4.1 4.0  CL 98* 100* 103 105 107  CO2 20* 21* 20* 17* 19*  GLUCOSE 59* 105* 127* 160* 120*  BUN 27* 17 20 17 17   CREATININE 0.76 0.68 0.70 0.68 0.58  CALCIUM 8.2* 7.7* 7.7* 7.6* 8.0*  MG 2.0  --   --   --   --   PHOS 3.0  --   --   --   --   ALKPHOS 72  --   --   --   --   AST 46*  --   --   --   --   ALT 24  --   --   --   --   ALBUMIN 2.3*  --   --   --   --     Results/Tests Pending at Time of  Discharge: None  Discharge Medications:  Allergies as of 09/13/2017      Reactions   Morphine And Related Other (See Comments)   "says doesn't work"   Tape Rash   Breaks the skin down      Medication List    STOP taking these medications   doxycycline 100 MG capsule Commonly known as:  VIBRAMYCIN     TAKE these medications   clindamycin 300 MG capsule Commonly known as:  CLEOCIN Take 2 capsules (600 mg) once during dinner on 09/13/17.   diphenhydrAMINE 25 MG tablet Commonly known as:   BENADRYL Take 25 mg by mouth at bedtime.   feeding supplement (GLUCERNA SHAKE) Liqd Take 237 mLs by mouth 3 (three) times daily between meals.   feeding supplement (PRO-STAT SUGAR FREE 64) Liqd Take 30 mLs by mouth 3 (three) times daily with meals.   glimepiride 2 MG tablet Commonly known as:  AMARYL Take 1 tablet (2 mg total) by mouth daily with breakfast.   losartan 25 MG tablet Commonly known as:  COZAAR Take 1 tablet (25 mg total) by mouth daily.   ondansetron 4 MG disintegrating tablet Commonly known as:  ZOFRAN ODT Take 1 tablet (4 mg total) by mouth every 8 (eight) hours as needed for nausea or vomiting.   oxyCODONE-acetaminophen 5-325 MG tablet Commonly known as:  PERCOCET/ROXICET Take 1 tablet by mouth every 4 (four) hours as needed for moderate pain or severe pain.   polyethylene glycol packet Commonly known as:  MIRALAX / GLYCOLAX Take 17 g by mouth daily as needed for mild constipation.            Durable Medical Equipment  (From admission, onward)        Start     Ordered   09/13/17 0000  For home use only DME 3 n 1     09/13/17 1231   09/12/17 1206  For home use only DME 3 n 1  Once     09/12/17 1205      Discharge Instructions: Please refer to Patient Instructions section of EMR for full details.  Patient was counseled important signs and symptoms that should prompt return to medical care, changes in medications, dietary instructions, activity restrictions, and follow up appointments.   Follow-Up Appointments: [place] Follow up.   Why:  home health nurse Contact information: [address] [place] [address] [place]. Go on 09/17/2017.   Why:  Go to appt at 8:50 AM. Please arrive 15 mins early and bring meds. Contact information: [place], [place], DO 09/13/2017,  1:55 PM [place]

## 2017-09-12 NOTE — Progress Notes (Signed)
Family Medicine Teaching Service Daily Progress Note Intern Pager: [phone]  Patient name: Jacqueline Goodman Medical record number: [id number] Date of birth: 1951-01-08 Age: 67 y.o. Gender: female  Primary Care Provider: Lind Covert, MD Consultants: Wound care Code Status: Full  Pt Overview and Major Events to Date:  Jacqueline Kovatch Justiceis a 67 y.o.femalepresenting with fevers and chills concerning for a wound infection. PMH is significant fortummy tuck on 07/22/17, T2DM, HTN, HLD.  Assessment and Plan:  Abdominal wound:Patient with fevers, chills, nausea presenting with foul-smelling abdominal wound after tummy tuck on 07/22/17. She has previously been on Vancomycin and Zosyn in mid December for purulent drainage and wound dehiscence with I&D on 12/20. Patient underwent debridment on 1/7 and was subsequently seen on 1/9 in the ED with concern for abdominal wound infection.at that time, patient declined admission for IV abx and was d/c with doxycycline.Has been on oral doxycycline since without resolution of her symptoms.Plastic surgeon and wound consult nurse evaluated 1/15 and determined that wound was not infected.  They did mild debridement and replaced wound vac.   - blood cultures negative at 48h - stop vanc and Zosyn - start clindamycin for 2 day (1/17-1/18) - mIVF @ 75 ml/hr - wound vac changes MWF - home zofran for nausea - home percocet 5-325 mg Q4H PRN for pain  Cough, FUX:NATFTDD with cough, shortness of breath, and recent history of fever and chills.Positive for influenza A. Chest XR showed lower lobe infiltrates concerning for pneumonia. She is being covered with broad spectrum antibiotics. WBC count continues to be normal.  She did have a fever last night - Tamiflu finished today - starting clindamycin today (see above) - sputum cultures - Legionella antigen negatiive - strep pneumonia urine antigen negative - continue broad spectrum antibiotics (see  above) - incentive spirometry   Anemia:  Hemoglobin has been decreasing from 10.0 on admission to 7.5 on 1/17.  MCV is low normal. Iron is low, but ferritin is normal indicating possible Anemia of chronic disease. Other possible causes are dilutional from IV fluids, blood loss from wound vac, iron deficiency. - monitor blood count tomorrow  T2DM:Last A1c 6.1 on 08/23/17. On Amaryl at home. Plastic surgeon recommended CBGs <150 during patient's last admission.Blood sugars have been increasing -stopmoderate SSI - restart home Amyril  HTN: Diastolic blood pressures have remained low. Takes losartan 25 mg daily at home. - restart losartan to monitor diastolic blood pressure  UKG:URKY lipid panel with LDL 127, total cholesterol 206.  - consider addition of statin on discharge  FEN/GI:mIVF @ 75 ml/hr, carb modified diet Prophylaxis:lovenox  Disposition: Home  Subjective:  Feeling much better this morning.  Is not coughing.  Has not had any blood in stool.    Objective: Temp:  [97.5 F (36.4 C)-98.1 F (36.7 C)] 98.1 F (36.7 C) (01/17 0616) Pulse Rate:  [64-74] 64 (01/17 0616) Resp:  [18-19] 18 (01/17 0616) BP: (103-135)/(44-50) 124/44 (01/17 0616) SpO2:  [98 %] 98 % (01/17 0616) Physical Exam: General: Alert, well appearing, sitting up in chair Cardiovascular: Normal S1 and S2, no murmurs, rubs or gallops Respiratory: crackles in bases of lungs Extremities: Normal radial pulses  Laboratory: Recent Labs  Lab 09/10/17 0728 09/11/17 0348 09/12/17 0554  WBC 6.3 7.0 6.5  HGB 8.7* 8.2* 7.5*  HCT 26.7* 25.0* 23.5*  PLT 320 267 238   Recent Labs  Lab 09/09/17 1704 09/10/17 0728 09/11/17 0348 09/12/17 0554  NA 130* 130* 130* 131*  K 4.3 3.9 4.0  4.1  CL 98* 100* 103 105  CO2 20* 21* 20* 17*  BUN 27* 17 20 17   CREATININE 0.76 0.68 0.70 0.68  CALCIUM 8.2* 7.7* 7.7* 7.6*  PROT 6.1*  --   --   --   BILITOT 0.5  --   --   --   ALKPHOS 72  --   --   --    ALT 24  --   --   --   AST 46*  --   --   --   GLUCOSE 59* 105* 127* 160*    Imaging/Diagnostic Tests: None  Landry Dyke, Medical Student 09/12/2017, 8:06 AM [place] Intern pager: [phone], text pages welcome   RESIDENT ADDENDUM  I have separately seen and examined the patient. I have discussed the findings and exam with the medical student and agree with the above note, which I have edited appropriately. I helped develop the management plan that is described in the student's note, and I agree with the content.  Additionally I have outlined my exam and assessment/plan below:   67 year old female admitted with fevers and chills despite taking Doxycycline outpatient. S/p tummy tuck in November 2018 with two subsequent debridements.   PE:  Gen: female sitting in chair in NAD  CV: RRR. No murmurs present.  Lungs: A few scattered rhonchi that clear easily with good air movement. Normal WOB.  Abd: Wound vac in place. Surrounding erythema on visualized skin.    A/P:  Abdominal Wound: S/p tummy tuck. Evaluated by wound care and plastic surgeon who found no signs of active infection and evidence of good wound healing. Bedside debridement performed during hospitalization. Plan for wound care MWF.  Cough/SOB: Flu A+. Tamiflu (1/15- ). CXR concerning for PNA. Given patient has required hospitalization, will presume bacterial PNA and treat as such. Currently on Vancomycin and Zosyn. Will switch to Clindamycin and treat for 5 days total of antibiotics. D/c fluids today as tolerating good PO intake.  T2DM: CBGs slightly elevated. Will resume home Amaryl.  HTN: BPs now normotensive. Resume Losartan and monitor.  Anemia:   Hemoglobin has been decreasing from 10.0 on admission to 7.5 on 1/17.  MCV is low normal. Iron is low, but ferritin is normal indicating possible Anemia of chronic disease. Other possible causes are dilutional from IV fluids, blood loss from wound  vac, iron deficiency. Check CBC in AM.    Disposition: Anticipate discharge tomorrow if hemoglobin stable. Will FYI plastic surgeon prior to discharge for wound assessment. DME orders and [facility] PT/OT orders have been placed.    Phill Myron, D.O. 09/12/2017, 2:15 PM PGY-3, [place]

## 2017-09-12 NOTE — Care Management Note (Signed)
Case Management Note  Patient Details  Name: [name] MRN: [id number] Date of Birth: [birth year]  Subjective/Objective:                    Action/Plan:   Expected Discharge Date:                  Expected Discharge Plan:  [employer]  In-House Referral:     Discharge planning Services  CM Consult  Post Acute Care Choice:  Home Health Choice offered to:  Patient  DME Arranged:  3-N-1 DME Agency:  NA, [place] Arranged:  RN, PT, OT [facility] Agency:  [place]  Status of Service:  Completed, signed off  If discussed at [place] of Stay Meetings, dates discussed:    Additional Comments:  Marilu Favre, RN 09/12/2017, 12:22 PM

## 2017-09-12 NOTE — Progress Notes (Signed)
Pharmacy Antibiotic Note  Jacqueline Goodman is a 67 y.o. female admitted on 09/09/2017 with wound infection, now s/p I&D and MD deemed location to be non-infectious.  Pharmacy has been consulted for vancomycin and Zosyn dosing.  Noted that CXR showed PNA vs atelectasis.  Patient's renal function is stable.  Now afebrile, WBC WNL, PCT 0.13   Plan: Continue vanc 1gm IV Q12H Continue Zosyn EID 3.375gm IV Q8H Monitor renal fxn, clinical progress, vanc trough tomorrow Consider narrowing/changing to PO abx   Height: 5\' 10"  (177.8 cm) IBW/kg (Calculated) : 68.5  Temp (24hrs), Avg:97.8 F (36.6 C), Min:97.5 F (36.4 C), Max:98.1 F (36.7 C)  Recent Labs  Lab 09/09/17 1704 09/10/17 0728 09/11/17 0348 09/12/17 0554  WBC 7.3 6.3 7.0 6.5  CREATININE 0.76 0.68 0.70 0.68    Estimated Creatinine Clearance: 88.9 mL/min (by C-G formula based on SCr of 0.68 mg/dL).    Allergies  Allergen Reactions  . Morphine And Related Other (See Comments)    "says doesn't work"  . Tape Rash    Breaks the skin down     Vanc 1/14 >> Zosyn 1/14 >> Doxy PTA  1/14 Flu A - positive 1/14 BCx - NGTD   Jacqueline Goodman D. Mina Marble, PharmD, BCPS Pager:  [phone] 09/12/2017, 9:34 AM

## 2017-09-13 ENCOUNTER — Telehealth: Payer: Self-pay | Admitting: *Deleted

## 2017-09-13 LAB — CBC
HCT: 26 % — ABNORMAL LOW (ref 36.0–46.0)
Hemoglobin: 8.4 g/dL — ABNORMAL LOW (ref 12.0–15.0)
MCH: 26.4 pg (ref 26.0–34.0)
MCHC: 32.3 g/dL (ref 30.0–36.0)
MCV: 81.8 fL (ref 78.0–100.0)
Platelets: 280 10*3/uL (ref 150–400)
RBC: 3.18 MIL/uL — ABNORMAL LOW (ref 3.87–5.11)
RDW: 14.5 % (ref 11.5–15.5)
WBC: 6.1 10*3/uL (ref 4.0–10.5)

## 2017-09-13 LAB — BASIC METABOLIC PANEL
Anion gap: 9 (ref 5–15)
BUN: 17 mg/dL (ref 6–20)
CO2: 19 mmol/L — ABNORMAL LOW (ref 22–32)
Calcium: 8 mg/dL — ABNORMAL LOW (ref 8.9–10.3)
Chloride: 107 mmol/L (ref 101–111)
Creatinine, Ser: 0.58 mg/dL (ref 0.44–1.00)
GFR calc Af Amer: >60 mL/min (ref >60)
GFR calc non Af Amer: >60 mL/min (ref >60)
Glucose, Bld: 120 mg/dL — ABNORMAL HIGH (ref 65–99)
Potassium: 4 mmol/L (ref 3.5–5.1)
Sodium: 135 mmol/L (ref 135–145)

## 2017-09-13 LAB — RETICULOCYTES
RBC.: 3.18 MIL/uL — ABNORMAL LOW (ref 3.87–5.11)
Retic Count, Absolute: 38.2 10*3/uL (ref 19.0–186.0)
Retic Ct Pct: 1.2 % (ref 0.4–3.1)

## 2017-09-13 LAB — GLUCOSE, CAPILLARY
Glucose-Capillary: 149 mg/dL — ABNORMAL HIGH (ref 65–99)
Glucose-Capillary: 118 mg/dL — ABNORMAL HIGH (ref 65–99)
Glucose-Capillary: 110 mg/dL — ABNORMAL HIGH (ref 65–99)
Glucose-Capillary: 109 mg/dL — ABNORMAL HIGH (ref 65–99)

## 2017-09-13 MED ORDER — CLINDAMYCIN HCL 300 MG PO CAPS: ORAL_CAPSULE | ORAL | 0 refills | Status: DC

## 2017-09-13 NOTE — Consult Note (Signed)
[place] Nurse wound follow up Reason for Consult:Wound type:surgical Pressure Injury POA:NA Measurement:32cm x 11cm x 3.0cm with undermining at 1100 o'clock 12cm, 12 o'clock 10 cm, 1 o'clock 8cm Wound EKC:MKLKJ, pink, granulation tissue Drainage (amount, consistency, odor)moderate, serosanguinous Dressing procedure/placement/frequency:  3 pc of white foam used to fill the undermining proximally. With large portion of while foam left outside undermined section to allow for appropriate removal at dressing changes. Umbilicus covered with oil emulsion dressing. 4 pc of black foam used to fill remainder of the wound bed. Used 1/2 of barrier ring in a long strip along the left lateral edge of the wound. She reports that when she stand, the [facility] will leak in that area. This is related to large skin fold in this area, addressed with this strip of ostomy barrier.  Seal obtained at 112mmHG. Patient tolerated, received PO pain meds just prior the dressing change per her request.    Will plan to change dressing M/W/F.  Will need HHRN for dressing changes M/W/F, plans for family to bring fully charged home VAC with canister to hook to current dressing at DC. Patient aware to have family bring.    [place], [place], [place]

## 2017-09-13 NOTE — Telephone Encounter (Signed)
Daughter called in states pharmacy would not fill cleocin Rx. Spoke with Ana at [facility] and directions were to take 2 caps but disp 1 cap. Rx changed to disp 2 caps. Pharmacy will fill this now. Hubbard Hartshorn, RN, BSN

## 2017-09-13 NOTE — Discharge Instructions (Signed)
You were admitted for having persistent fevers despite outpatient antibiotics.  You were tested positive for the flu which is thought to be the source of the fever.  Your chest x-ray did have signs of both pneumonia and given your symptoms we decided to treat with antibiotics.  He will need to continue the clindamycin for 1 more dose this evening with dinner.  We discussed your care with the surgeon prior to discharge.  We have scheduled you for a hospital follow-up on the 22nd.

## 2017-09-13 NOTE — Progress Notes (Signed)
Family Medicine Teaching Service Daily Progress Note Intern Pager: [phone]  Patient name: Jacqueline Goodman Medical record number: [id number] Date of birth: 26-Feb-1951 Age: 67 y.o. Gender: female  Primary Care Provider: Lind Covert, MD Consultants: Wound care Code Status: Full  Pt Overview and Major Events to Date:  Jacqueline Mckeon Justiceis a 67 y.o.femalepresenting with fevers and chills concerning for a wound infection. PMH is significant fortummy tuck on 07/22/17, T2DM, HTN, HLD.  Assessment and Plan: Abdominal wound:Patient with fevers, chills, nausea presenting with foul-smelling abdominal wound after tummy tuck on 07/22/17. She has previously been on Vancomycin and Zosyn in mid December for purulent drainage and wound dehiscence with I&D on 12/20. Patient underwent debridment on 1/7 and was subsequently seen on 1/9 in the ED with concern for abdominal wound infection.at that time, patient declined admission for IV abx and was d/c with doxycycline.Has been on oral doxycycline since without resolution of her symptoms.Plastic surgeon and wound consult nurse evaluated 1/15 and determined that wound was not infected. They did mild debridement and replaced wound vac.  - blood cultures negative at 48h - stop vanc and Zosyn - continue clindamycin today (1/17-1/18) -wound vac changes MWF - home zofran for nausea - home percocet 5-325 mg Q4H PRN for pain - contact plastic surgeon before discharge  Cough, IEP:PIRJJOA with cough, shortness of breath, and recent history of fever and chills.Positive for influenza A. Chest XR showed lower lobe infiltrates concerning for pneumonia. She is being covered with broad spectrum antibiotics.WBC count continues to be normal. She did have a fever last night - starting clindamycin today (see above) - sputum cultures - Legionellaantigen negatiive -strep pneumonia urine antigennegative - continue broad spectrum antibiotics (see  above) - incentive spirometry  Anemia: Hemoglobin decreased from 10.0 on admission to 7.5 on 1/17, but increased to 8.4 on discharge. MCV is low normal. Iron is low, but ferritin is normal indicating possible Anemia of chronic disease. Other possible causes are dilutional from IV fluids, blood loss from wound vac, iron deficiency. - follow up outpatient  T2DM:Last A1c 6.1 on 08/23/17. On Amaryl at home. Plastic surgeon recommended CBGs <150 during patient's last admission.Blood sugars have been increasing -stopmoderate SSI - restart home Amyril  CZY:SAYTKZSWF blood pressures have remained low. Takes losartan 25 mg daily at home.  Blood pressure low this morning, but during sleep -restartlosartan to monitor diastolic blood pressure  UXN:ATFT lipid panel with LDL 127, total cholesterol 206.  - consider addition of statin on discharge  FEN/GI:mIVF @ 75 ml/hr, carb modified diet Prophylaxis:lovenox  Disposition: Home  Subjective:  Doing well this morning, but very anxious to get home.  Objective: Temp:  [98.4 F (36.9 C)-98.6 F (37 C)] 98.4 F (36.9 C) (01/18 0530) Pulse Rate:  [60-70] 60 (01/18 0530) Resp:  [18] 18 (01/18 0530) BP: (108-142)/(27-64) 108/27 (01/18 0530) SpO2:  [99 %-100 %] 99 % (01/18 0530) Physical Exam: General: Alert and well appearing, sitting up in chair. Cardiovascular: Normal S1 and S2, no murmurs, rubs or gallops Respiratory: Clear to auscultation bilaterally Extremities: Normal radial pulses  Laboratory: Recent Labs  Lab 09/10/17 0728 09/11/17 0348 09/12/17 0554  WBC 6.3 7.0 6.5  HGB 8.7* 8.2* 7.5*  HCT 26.7* 25.0* 23.5*  PLT 320 267 238   Recent Labs  Lab 09/09/17 1704  09/11/17 0348 09/12/17 0554 09/13/17 0600  NA 130*   < > 130* 131* 135  K 4.3   < > 4.0 4.1 4.0  CL 98*   < >  103 105 107  CO2 20*   < > 20* 17* 19*  BUN 27*   < > 20 17 17   CREATININE 0.76   < > 0.70 0.68 0.58  CALCIUM 8.2*   < > 7.7* 7.6*  8.0*  PROT 6.1*  --   --   --   --   BILITOT 0.5  --   --   --   --   ALKPHOS 72  --   --   --   --   ALT 24  --   --   --   --   AST 46*  --   --   --   --   GLUCOSE 59*   < > 127* 160* 120*   < > = values in this interval not displayed.    Imaging/Diagnostic Tests: None  Landry Dyke, Medical Student 09/13/2017, 7:53 AM [place] Intern pager: [phone], text pages welcome  RESIDENT ADDENDUM  I have separately seen and examined the patient. I have discussed the findings and exam with the medical student and agree with the above note, which I have edited appropriately. I helped develop the management plan that is described in the student's note, and I agree with the content.  Additionally I have outlined my exam and assessment/plan below:   67 year old female admitted with fevers and chills despite taking Doxycyclineoutpatient. S/p tummy tuck in November 2018 with two subsequent debridements. Feels much better today. Ready to go home.   PE: Gen: female sitting in chair in NAD  CV: RRR. No murmurs present.  Lungs: CTAB. Normal WOB.  Abd: Wound vac in place. Surrounding erythema on visualized skin.   A/P: AbdominalWound:S/p tummy tuck. Evaluated by wound care and plastic surgeon who found no signs of active infection and evidence of good wound healing. Bedside debridement performed during hospitalization. Plan for wound care MWF.Will FYI plastic surgeon today. Cough/SOB: Flu A+.Tamiflu (1/15- ). CXR concerning for PNA. Given patient has required hospitalization, will presume bacterial PNA and treat as such.Treating with Clindamycin for 5 days total of antibiotics.  T2DM: CBGs stable with resumption of home Amaryl.  HTN:BPs now normotensive with one low BP this AM while patient sleeping. Will repeat BP today and likely restart Losartan.  Anemia: Hemoglobin had been slowly decreasing from 10.0 on admission to 7.5 on 1/17. Repeat HgB improved to  8.4 today. Suspect combination of dilution from fluids as well as slow blood loss from wound vac.     Disposition: Anticipate discharge today.Have spoken to Dr. Nathanial Rancher' receptionist who will let surgeon know we are planning to discharge so that wound assessment can be performed. Needs wound vac change today prior to discharge. DME orders and [facility] PT/OT orders have been placed.     Phill Myron, D.O. 09/13/2017, 9:25 AM PGY-3, [place]

## 2017-09-13 NOTE — Progress Notes (Signed)
Physical Therapy Treatment Patient Details Name: Jacqueline Goodman MRN: [id number] DOB: 06-14-1951 Today's Date: 09/13/2017    History of Present Illness 67 yo F with recent "tummy tuck" surgery about 1 month ago.  Has had several complications stemming from surgery.  With poor wound healing due to diabetes.  CBGs have been better recently.  Known DM2, HTN, HLD who presents with "flu like symptoms."  She has been having fever and chill plus nausea at home.  She was seen in the emergency department a few days ago and was having vomiting at that time.  She was offered admission for worsening status and concern for possible secondary infection and inability to tolerate p.o. but she declined admission.  She was started on doxycycline.     PT Comments    Pt progressing towards physical therapy goals. Was able to perform transfers and ambulation with up to min assist for balance support and safety. Pt demonstrating frequent lateral losses of balance however continues to refuse the RW. Stair training initiated, and would benefit from practicing again next session if still here. Will continue to follow and progress as able per POC.    Follow Up Recommendations  Home health PT;Supervision for mobility/OOB     Equipment Recommendations  None recommended by PT    Recommendations for Other Services       Precautions / Restrictions Precautions Precautions: Fall Precaution Comments: does not want to use RW due to fall x2 with RW; abdominal wound with wound vac  Restrictions Weight Bearing Restrictions: No    Mobility  Bed Mobility Overal bed mobility: Needs Assistance Bed Mobility: Supine to Sit     Supine to sit: Supervision     General bed mobility comments: increased time/effort to transition to sitting EOB but no physical assist required, use of bedrails to complete.  Transfers Overall transfer level: Needs assistance Equipment used: None Transfers: Sit to/from Stand Sit to Stand: Min  guard         General transfer comment: Close guard for safety as pt powered up to full standing position. Pt with hands in high guard position upon standing but no assist required to maintain standing balance.   Ambulation/Gait Ambulation/Gait assistance: Min assist Ambulation Distance (Feet): 120 Feet Assistive device: 1 person hand held assist Gait Pattern/deviations: Step-through pattern;Decreased stride length;Trunk flexed Gait velocity: decreased Gait velocity interpretation: Below normal speed for age/gender General Gait Details: Slow and unsteady. HHA provided for safety and occasional min assist required due to lateral losses of balance   Stairs Stairs: Yes   Stair Management: One rail Right;Step to pattern;Forwards(HHA on L) Number of Stairs: 1(x3 attempts) General stair comments: VC's for sequencing and general safety with stair negotiation. Pt initially requiring mod assist to boost up to stair, however with practice progressed to min assist.   Wheelchair Mobility    Modified Rankin (Stroke Patients Only)       Balance Overall balance assessment: Needs assistance Sitting-balance support: No upper extremity supported;Feet supported Sitting balance-Leahy Scale: Good     Standing balance support: During functional activity;Single extremity supported Standing balance-Leahy Scale: Fair                              Cognition Arousal/Alertness: Awake/alert Behavior During Therapy: WFL for tasks assessed/performed Overall Cognitive Status: Within Functional Limits for tasks assessed  Exercises      General Comments        Pertinent Vitals/Pain Pain Assessment: Faces Faces Pain Scale: Hurts a little bit Pain Location: abdomen Pain Descriptors / Indicators: Sore Pain Intervention(s): Limited activity within patient's tolerance;Monitored during session;Repositioned    Home Living                       Prior Function            PT Goals (current goals can now be found in the care plan section) Acute Rehab PT Goals Patient Stated Goal: to go home today PT Goal Formulation: With patient Time For Goal Achievement: 09/25/17 Potential to Achieve Goals: Good Progress towards PT goals: Progressing toward goals    Frequency    Min 3X/week      PT Plan Current plan remains appropriate    Co-evaluation              AM-PAC PT "6 Clicks" Daily Activity  Outcome Measure  Difficulty turning over in bed (including adjusting bedclothes, sheets and blankets)?: A Little Difficulty moving from lying on back to sitting on the side of the bed? : A Little Difficulty sitting down on and standing up from a chair with arms (e.g., wheelchair, bedside commode, etc,.)?: A Little Help needed moving to and from a bed to chair (including a wheelchair)?: A Little Help needed walking in hospital room?: A Little Help needed climbing 3-5 steps with a railing? : A Little 6 Click Score: 18    End of Session Equipment Utilized During Treatment: Gait belt Activity Tolerance: Patient tolerated treatment well Patient left: in chair;with call bell/phone within reach;with family/visitor present Nurse Communication: Mobility status PT Visit Diagnosis: Muscle weakness (generalized) (M62.81);Unsteadiness on feet (R26.81)     Time: 8786-7672 PT Time Calculation (min) (ACUTE ONLY): 27 min  Charges:  $Gait Training: 23-37 mins                    G Codes:       Rolinda Roan, PT, DPT Acute Rehabilitation Services Pager: [place] 09/13/2017, 1:54 PM

## 2017-09-14 DIAGNOSIS — L7682 Other postprocedural complications of skin and subcutaneous tissue: Secondary | ICD-10-CM | POA: Diagnosis not present

## 2017-09-14 DIAGNOSIS — E119 Type 2 diabetes mellitus without complications: Secondary | ICD-10-CM | POA: Diagnosis not present

## 2017-09-14 DIAGNOSIS — I10 Essential (primary) hypertension: Secondary | ICD-10-CM | POA: Diagnosis not present

## 2017-09-14 DIAGNOSIS — E8809 Other disorders of plasma-protein metabolism, not elsewhere classified: Secondary | ICD-10-CM | POA: Diagnosis not present

## 2017-09-14 DIAGNOSIS — T8189XD Other complications of procedures, not elsewhere classified, subsequent encounter: Secondary | ICD-10-CM | POA: Diagnosis not present

## 2017-09-14 DIAGNOSIS — J189 Pneumonia, unspecified organism: Secondary | ICD-10-CM | POA: Diagnosis not present

## 2017-09-14 LAB — CULTURE, BLOOD (ROUTINE X 2)
Culture: NO GROWTH
Culture: NO GROWTH
Special Requests: ADEQUATE
Special Requests: ADEQUATE

## 2017-09-16 ENCOUNTER — Encounter: Payer: Self-pay | Admitting: Family Medicine

## 2017-09-16 DIAGNOSIS — L7682 Other postprocedural complications of skin and subcutaneous tissue: Secondary | ICD-10-CM | POA: Diagnosis not present

## 2017-09-16 DIAGNOSIS — E119 Type 2 diabetes mellitus without complications: Secondary | ICD-10-CM | POA: Diagnosis not present

## 2017-09-16 DIAGNOSIS — T8189XD Other complications of procedures, not elsewhere classified, subsequent encounter: Secondary | ICD-10-CM | POA: Diagnosis not present

## 2017-09-16 DIAGNOSIS — E8809 Other disorders of plasma-protein metabolism, not elsewhere classified: Secondary | ICD-10-CM | POA: Diagnosis not present

## 2017-09-16 DIAGNOSIS — J189 Pneumonia, unspecified organism: Secondary | ICD-10-CM | POA: Diagnosis not present

## 2017-09-16 DIAGNOSIS — I10 Essential (primary) hypertension: Secondary | ICD-10-CM | POA: Diagnosis not present

## 2017-09-16 NOTE — Progress Notes (Signed)
Subjective:    Patient ID: Jacqueline Goodman , female   DOB: Jan 20, 1951 , 67 y.o..   MRN: [id number]  HPI  Follow up outpatient visit after Hospitalization  Margan Elias Terry is accompanied by patient and daughter Sources of clinical information for visit is/are patient. The Discharge Summary for the hospitalization from 1/14 to 1/18 was reviewed.  Nursing assessment for this office visit was reviewed with the patient for accuracy and revision.   HPI Principle Diagnosis requiring hospitalization: pneumonia, flu, possible wound infection Brief Hospital course summary: There were some concerns for persistent infection at the wound site from tummy tuck.  Plastic surgery was consulted and evaluated wound without findings concerning for infection. Patient endorsed shortness of breath and cough and was found to test positive for influenza type A.  CXR also concerning for possible community-acquired pneumonia.  Patient was adequately treated for infection.  It was thought that the fever was related to this and not the abdominal wound.  Patient was stable and cleared for discharge with home health RN to address dressing changes every Monday/Wednesday/Friday.  Patient was discharged on 1 additional dose of clindamycin treatment.   She notes that she is taking 1 pain pill a day. She does not have any pills left. She notes that the pain pill does help you. Patient notes that she was getting percocet from her plastic surgeon. She has been eating and drinking ok. Stooling and urinating ok. She notes that she still has a little cough but has felt better. Admits to some trouble breathing only when she is in pain. She is wearing her compression socks.  Her surgeon wants to see her tomorrow in the office.   Follow up instructions from patient's hospital healthcare providers:  1. Follow-up with plastic surgery as instructed.  We consulted home health nursing to visit to address dressings every Monday, Wednesday,  and Friday.  Patient will continue wearing the wound VAC in the meantime. 2. Patient did have anemia with anemia of chronic disease.  Follow-up as appropriate.  ---------------------------------------------------------------------------------------------------------------------- Follow up appointments with specialists:  Pending: plastic surgeon  Completed: tomorrow ---------------------------------------------------------------------------------------------------------------------- New medications started during hospitalization: Clindamycin, but finished that course. Chronic medications stopped during hospitalization: none Patient's Medication List was updated in the EMR: yes --------------------------------------------------------------------------------------------------------------------- Home Health Services: Home health nursing to visit to address dressings every Monday, Wednesday, and Friday. Durable Medical Equipment: She does have a wheelchair at home, but she is able to ambulate on her own at times.  ---------------------------------------------------------------------------------------------------------------------   Review of Systems: Per HPI.    Past Medical History: Patient Active Problem List   Diagnosis Date Noted  . Anemia of chronic disease 09/12/2017  . Normocytic anemia due to blood loss   . Influenza with pneumonia 09/10/2017  . Wound infection after surgery 09/09/2017  . Fatigue 08/28/2017  . Protein-calorie malnutrition, severe 08/14/2017  . Hypoglycemia 08/10/2017  . Community acquired pneumonia of left lower lobe of lung ([place])   . Wound dehiscence   . Petechiae 07/09/2017  . 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 Situational anxiety 03/13/2017  . Hyperlipidemia 07/05/2016  . Essential hypertension 06/28/2016  . Diabetes mellitus type 2 in obese ([place]) 06/07/2016    Medications: reviewed and updated Current Outpatient Medications  Medication Sig Dispense  Refill  . Amino Acids-Protein Hydrolys (FEEDING SUPPLEMENT, PRO-STAT SUGAR FREE 64,) LIQD Take 30 mLs by mouth 3 (three) times daily with meals. 900 mL 2  . clindamycin (CLEOCIN) 300 MG capsule Take 2 capsules (600 mg)  once during dinner on 09/13/17. 1 capsule 0  . diphenhydrAMINE (BENADRYL) 25 MG tablet Take 25 mg by mouth at bedtime.     Marland Kitchen glimepiride (AMARYL) 2 MG tablet Take 1 tablet (2 mg total) by mouth daily with breakfast. 30 tablet 3  . losartan (COZAAR) 25 MG tablet Take 1 tablet (25 mg total) by mouth daily.    Marland Kitchen losartan (COZAAR) 50 MG tablet TAKE 1 TABLET BY MOUTH EVERY DAY 30 tablet 0  . ondansetron (ZOFRAN ODT) 4 MG disintegrating tablet Take 1 tablet (4 mg total) by mouth every 8 (eight) hours as needed for nausea or vomiting. 10 tablet 0  . oxyCODONE-acetaminophen (PERCOCET/ROXICET) 5-325 MG tablet Take 1 tablet by mouth every 4 (four) hours as needed for moderate pain or severe pain. 30 tablet 0  . polyethylene glycol (MIRALAX / GLYCOLAX) packet Take 17 g by mouth daily as needed for mild constipation. 14 each 0   No current facility-administered medications for this visit.     Social Hx:  reports that  has never smoked. she has never used smokeless tobacco.   Objective:   BP 132/60   Pulse 74   Temp 97.9 F (36.6 C) (Oral)   Ht 5\' 9"  (1.753 m)   Wt 218 lb 12.8 oz (99.2 kg)   SpO2 98%   BMI 32.31 kg/m  Physical Exam  Gen: NAD, alert, cooperative with exam, well-appearing HEENT: NCAT, PERRL, clear conjunctiva, oropharynx clear, supple neck Cardiac: Regular rate and rhythm, trace edema bilateral ankles Respiratory: Clear to auscultation bilaterally, no wheezes, non-labored breathing Gastrointestinal: wound vac on abdomen, surround area clean, dry, and non erythematous Psych: good insight, normal mood and affect  Assessment & Plan:   1. Hospital discharge follow-up: Patient doing well since hospital discharge.  She completed her treatment for the flu and pneumonia.   She has a normal respiratory exam today with O2 at 98% on room air.  Her wound VAC on her abdomen looks clean dry and intact.  She is following with the plastic surgeon tomorrow.  She is nearly out of her Percocet that she uses at least once every other day when her pain in her abdomen becomes too much.  She states that her surgeon said that she would no longer prescribe her any pain medicine. Provided 1 refill for her.  Discussed that the goal would be to transition off of this medication once her abdominal wound has healed more.  Patient also had some anemia after her last hospitalization, hemoglobin was 8.4.  She prefers to recheck her hemoglobin at her PCP visit in 2 weeks.   Meds ordered this encounter  Medications  . oxyCODONE-acetaminophen (PERCOCET/ROXICET) 5-325 MG tablet    Sig: Take 1 tablet by mouth every 4 (four) hours as needed for moderate pain or severe pain.    Dispense:  30 tablet    Refill:  0    Smitty Cords, MD [place], PGY-3

## 2017-09-17 ENCOUNTER — Other Ambulatory Visit: Payer: Self-pay | Admitting: Family Medicine

## 2017-09-17 ENCOUNTER — Other Ambulatory Visit: Payer: Self-pay

## 2017-09-17 ENCOUNTER — Ambulatory Visit (INDEPENDENT_AMBULATORY_CARE_PROVIDER_SITE_OTHER): Payer: Medicare Other | Admitting: Family Medicine

## 2017-09-17 ENCOUNTER — Encounter: Payer: Self-pay | Admitting: Family Medicine

## 2017-09-17 VITALS — BP 132/60 | HR 74 | Temp 97.9°F | Ht 69.0 in | Wt 218.8 lb

## 2017-09-17 DIAGNOSIS — Z09 Encounter for follow-up examination after completed treatment for conditions other than malignant neoplasm: Secondary | ICD-10-CM | POA: Diagnosis present

## 2017-09-17 MED ORDER — OXYCODONE-ACETAMINOPHEN 5-325 MG PO TABS: 1.0000 | ORAL_TABLET | ORAL | 0 refills | Status: DC | PRN

## 2017-09-17 NOTE — Patient Instructions (Signed)
Thank you for coming in today, it was so nice to see you! Today we talked about:    You were seen today for hospital follow-up; you are doing great!  Keep up the good work.  We would like to continue to follow with you closely.  Dr. Erin Hearing will see you in February.    Please come back to the clinic sooner if you are having fever, shortness of breath, wheezing, worsening abdominal pain, or your wound VAC looks more red  I have refilled your pain medicine   If you have any questions or concerns, please do not hesitate to call the office at [phone]. You can also message me directly via MyChart.   Sincerely,  Smitty Cords, MD

## 2017-09-18 DIAGNOSIS — J189 Pneumonia, unspecified organism: Secondary | ICD-10-CM | POA: Diagnosis not present

## 2017-09-18 DIAGNOSIS — T8189XD Other complications of procedures, not elsewhere classified, subsequent encounter: Secondary | ICD-10-CM | POA: Diagnosis not present

## 2017-09-18 DIAGNOSIS — L7682 Other postprocedural complications of skin and subcutaneous tissue: Secondary | ICD-10-CM | POA: Diagnosis not present

## 2017-09-18 DIAGNOSIS — E119 Type 2 diabetes mellitus without complications: Secondary | ICD-10-CM | POA: Diagnosis not present

## 2017-09-18 DIAGNOSIS — I10 Essential (primary) hypertension: Secondary | ICD-10-CM | POA: Diagnosis not present

## 2017-09-18 DIAGNOSIS — E8809 Other disorders of plasma-protein metabolism, not elsewhere classified: Secondary | ICD-10-CM | POA: Diagnosis not present

## 2017-09-19 DIAGNOSIS — E8809 Other disorders of plasma-protein metabolism, not elsewhere classified: Secondary | ICD-10-CM | POA: Diagnosis not present

## 2017-09-19 DIAGNOSIS — T8189XD Other complications of procedures, not elsewhere classified, subsequent encounter: Secondary | ICD-10-CM | POA: Diagnosis not present

## 2017-09-19 DIAGNOSIS — I10 Essential (primary) hypertension: Secondary | ICD-10-CM | POA: Diagnosis not present

## 2017-09-19 DIAGNOSIS — E119 Type 2 diabetes mellitus without complications: Secondary | ICD-10-CM | POA: Diagnosis not present

## 2017-09-19 DIAGNOSIS — L7682 Other postprocedural complications of skin and subcutaneous tissue: Secondary | ICD-10-CM | POA: Diagnosis not present

## 2017-09-19 DIAGNOSIS — J189 Pneumonia, unspecified organism: Secondary | ICD-10-CM | POA: Diagnosis not present

## 2017-09-19 NOTE — Op Note (Signed)
NAMEJAYLAA, GALLION               ACCOUNT NO.:  [id number]  MEDICAL RECORD NO.:  [id number]  LOCATION:                                 FACILITY:  PHYSICIAN:  Hetty Blend, M.D.DATE OF BIRTH:  01-Nov-1950  DATE OF PROCEDURE:  08/16/2017 DATE OF DISCHARGE:  08/21/2017                              OPERATIVE REPORT   PREOPERATIVE DIAGNOSES:  Fatty necrosis of panniculectomy, wound edges status post panniculectomy.  POSTOPERATIVE DIAGNOSES:  Fatty necrosis of panniculectomy, wound edges status post panniculectomy.  PROCEDURES:  Debridement of panniculectomy, abdominal wound incision.  ATTENDING SURGEON:  Hetty Blend, MD.  ANESTHESIA:  General.  INDICATIONS FOR PROCEDURE:  The patient is a 67 year old Caucasian female, who underwent a panniculectomy on July 22, 2017, to hopefully improve her diabetes control as well as to help with her ambulation.  She had lost a significant amount of weight and had a significant abdominal pannus that was present.  We worked with Dr. Erin Hearing of the [facility] to have her prepared for surgery.  Postoperatively, the patient did have some issues with good glucose control.  As a result of this as well as increased risk for fatty necrosis due to her overall history of obesity and medical issues, she has developed some fatty necrosis of the lower aspect of the upper abdominoplasty incision.  She was admitted to the hospital due to poor glucose control and overall weakness.  We are taking her to the OR today to provide debridement of the wound to help with her wound healing.  DESCRIPTION OF PROCEDURE:  The patient was marked in preop holding area for this surgery.  She was then brought into the operating room, placed on the table in supine position.  After adequate general anesthesia was obtained, the patient's abdomen was prepped with Betadine and alcohol and draped in sterile fashion.  I then proceeded to sharply excise  any areas of skin that appeared to be compromised by the fatty necrosis. This skin was removed and meticulous hemostasis was obtained with the Bovie electrocautery.  Upon exploration of the wound, there was no purulent drainage or other signs of infection present.  I did proceed to take intraoperative aerobic and anaerobic cultures, however, in case this could help the medical team with any further therapy.  All areas of the wound were debrided as appropriate.  We then proceeded with antibiotic and saline pulsatile lavage of the wound.  The wound was then packed with saline moist to dry dressings as part of it was undermined superiorly as well as a small amount inferiorly.  Retention sutures were also placed using 0 Prolene suture in certain key areas to help the wound stay together better as it was healing.  There were no complications.  The patient tolerated the procedure well.  The final needle and sponge counts were reported to be correct at the end of the procedure.  The patient was then awakened from general anesthesia.  She was taken to the recovery room in stable condition.  She was then taken back up to her hospital room to continue her postop operative care as well as continuation of her medical stabilization for her medical condition.  We will continue to follow the patient as appropriate postoperatively.  She will need to have saline moist to dry dressings twice a day if not 3 times a day to help the wound heal in.  I will help the nursing staff with that.          ______________________________ Hetty Blend, M.D.     MC/MEDQ  D:  09/19/2017  T:  09/19/2017  Job:  [id number]  cc:   Hetty Blend, M.D.'s office Talbert Cage, M.D.

## 2017-09-19 NOTE — Op Note (Deleted)
  The note originally documented on this encounter has been moved the the encounter in which it belongs.  

## 2017-09-19 NOTE — Op Note (Signed)
NAME:  Jacqueline Goodman, Jacqueline Goodman                    ACCOUNT NO.:  MEDICAL RECORD NO.:  [id number]  LOCATION:                                 FACILITY:  PHYSICIAN:  Hetty Blend, M.D.DATE OF BIRTH:  November 11, 1950  DATE OF PROCEDURE:  09/02/2017 DATE OF DISCHARGE:                              OPERATIVE REPORT   PREOPERATIVE DIAGNOSIS:  Healing panniculectomy wound, status post debridement with a minimal soft tissue fatty necrosis.  POSTOPERATIVE DIAGNOSIS:  Healing panniculectomy incision with minimal fatty necrosis and no infection.  PROCEDURE: 1)Debridement of panniculectomy wound and 2)VAC Dressing placement  SURGEON:  Hetty Blend, MD.  ANESTHESIA:  IV sedation.  ANESTHESIOLOGIST:  Heriberto Antigua, MD.  INDICATIONS FOR PROCEDURE:  The patient is a 67 year old Caucasian female who initially underwent Panniculectomy on July 22, 2017. She then developed some fat necrosis of the panniculectomy incision. She developed fatty necrosis along the incision and was taken to the OR on August 16, 2017, at which time the wound was debrided.  Postoperatively, we performed saline moist to dry dressings and then converted the dressings to placement of a VAC dressing to help with further wound healing.  The patient was then discharged home by the medical team on August 21, 2017.  She returned to the hospital due to a significant case of the flu.  The medical team was concerned that perhaps there may be a wound infection as well of the panniculectomy incision.  She is therefore brought back to the operating room for further exploration and debridement as she has developed just a very small amount of fatty necrosis of some of the skin edges.  DESCRIPTION OF PROCEDURE:  The patient was marked in the preop holding area and brought into the OR.  She was placed on the table in the supine position.  After adequate IV sedation was obtained, the edges of the panniculectomy incision that had a minimal  amount of soft tissue compromise were infiltrated with 1% lidocaine with epinephrine.  After adequate hemostasis and anesthesia taken effect, the procedure was begun.  The abdomen was prepped with Betadine.  The wound was then draped in sterile fashion.  The retention sutures were then removed. The VAC dressing was also then removed.  Upon removing the VAC dressing, there was some of the white foam that was present, however, it was not in the undermined flap areas as it had been placed prior to her discharge from the hospital on 08/21/2017.  Also, there was a white foam present over the lower aspect of the wound, which did not have undermining and then the black foam on top of the white foam.  All of the VAC dressing was removed.  The skin edges were sharply debrided as appropriate and meticulous hemostasis was obtained.  Upon exploring the upper aspect of the wound, it had prematurely closed down on some of the areas of the undermining under the upper aspect of the panniculectomy flap likely due to the packing method of the VAC dressing.  Upon exploring this area of the wound, there was evidence of a seroma that was present.  The seroma fluid was evacuated.  There were no signs  of infection present including no purulent drainage and no signs of cellulitis.  The wound was thoroughly irrigated with saline irrigation.  The [employer] representative was present as their company supplies the [facility] dressing.  Together, we assessed and packed the wound appropriately.  White foam was used in the undermined areas superiorly. The rest of the wound was packed with the black foam for the dressing. A few retention sutures were placed using the 0 Prolene suture.  A significant amount of the wound had closed on the lateral aspects as well as inferiorly, (which also had some undermining initially but had closed down already), since the initial wound debridement. I marked the extent of the undermining for the superior flap,  which also had closed down some.  We will use these markings as a guideline for both the Wound Care team at [facility] as well as [place] to appropriately pack the wound with the [facility] dressing.  We then completed application of the VAC wound dressing.  The VAC dressing was hooked up to suction and it was functioning appropriately.  There were no complications.  The patient tolerated the procedure well.  The final needle and sponge counts were reported to be correct at the end of the case.  The patient was then recovered in the PACU and transferred to the floor in stable condition.  They will continue with the [facility] dressing, which will be performed by the Wound Care team here in the hospital.  She would then be stabilized medically and discharged at an appropriate time according to their judgment.  Regarding her wound, it is healing nicely, however, the VAC dressing needs to be done differently and this will be reviewed with the [place] by both [employer] as well as the Wound Care team.  The [employer] rep said they would arrange to have representative from their company go home to help the Health Nursing crew properly pack her VAC dressing at home.  I will follow the patient as needed on a p.r.n. basis at this point.  I have asked the Primary Care team to notify me when the patient is ready for discharge from their standpoint so I can evaluate the wound prior to her discharge if needed or should any other concerns develop.          ______________________________ Hetty Blend, M.D.     MC/MEDQ  D:  09/19/2017  T:  09/19/2017  Job:  [id number]  cc:   Hetty Blend, M.D.'s office Talbert Cage, M.D.

## 2017-09-20 DIAGNOSIS — I10 Essential (primary) hypertension: Secondary | ICD-10-CM | POA: Diagnosis not present

## 2017-09-20 DIAGNOSIS — L7682 Other postprocedural complications of skin and subcutaneous tissue: Secondary | ICD-10-CM | POA: Diagnosis not present

## 2017-09-20 DIAGNOSIS — E119 Type 2 diabetes mellitus without complications: Secondary | ICD-10-CM | POA: Diagnosis not present

## 2017-09-20 DIAGNOSIS — T8189XD Other complications of procedures, not elsewhere classified, subsequent encounter: Secondary | ICD-10-CM | POA: Diagnosis not present

## 2017-09-20 DIAGNOSIS — E8809 Other disorders of plasma-protein metabolism, not elsewhere classified: Secondary | ICD-10-CM | POA: Diagnosis not present

## 2017-09-20 DIAGNOSIS — J189 Pneumonia, unspecified organism: Secondary | ICD-10-CM | POA: Diagnosis not present

## 2017-09-23 DIAGNOSIS — J189 Pneumonia, unspecified organism: Secondary | ICD-10-CM | POA: Diagnosis not present

## 2017-09-23 DIAGNOSIS — E8809 Other disorders of plasma-protein metabolism, not elsewhere classified: Secondary | ICD-10-CM | POA: Diagnosis not present

## 2017-09-23 DIAGNOSIS — T8189XD Other complications of procedures, not elsewhere classified, subsequent encounter: Secondary | ICD-10-CM | POA: Diagnosis not present

## 2017-09-23 DIAGNOSIS — L7682 Other postprocedural complications of skin and subcutaneous tissue: Secondary | ICD-10-CM | POA: Diagnosis not present

## 2017-09-23 DIAGNOSIS — E119 Type 2 diabetes mellitus without complications: Secondary | ICD-10-CM | POA: Diagnosis not present

## 2017-09-23 DIAGNOSIS — I10 Essential (primary) hypertension: Secondary | ICD-10-CM | POA: Diagnosis not present

## 2017-09-23 NOTE — Brief Op Note (Incomplete)
08/15/2017  3:59 PM  PATIENT:  Jacqueline Goodman  67 y.o. 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  PROCEDURE:  Procedure(s) with comments: IRRIGATON AND DEBRIDEMENT OF ABDOMINAL WOUND (N/A) - SPINAL  SURGEON:  Surgeon(s) and Role:    * Contogiannis, Audrea Muscat, MD - Primary  PHYSICIAN ASSISTANT:   ASSISTANTS: {ASSISTANTS:31801}   ANESTHESIA:   {procedures; anesthesia:812}  EBL:  150 mL   BLOOD ADMINISTERED:{BLOOD GIVEN TYPES AND AMOUNTS:20467}  DRAINS: {Devices; drains:31758}   LOCAL MEDICATIONS USED:  {LOCAL MEDICATIONS:10721995}  SPECIMEN:  {ONC STAGING; AJCC TYPE OF SPECIMEN:115600001}  DISPOSITION OF SPECIMEN:  {SPECIMEN DISPOSITION:204680}  COUNTS:  {OR COUNTS CORRECT/INCORRECT:204690}  TOURNIQUET:  * No tourniquets in log *  DICTATION: .{DICTATION YJEHU:3149702637}  PLAN OF CARE: {OPTIME PLAN OF CHYI:5027741287}  PATIENT DISPOSITION:  {op note disposition:31782}   Delay start of Pharmacological VTE agent (>24hrs) due to surgical blood loss or risk of bleeding: {YES/NO/NOT APPLICABLE:20182}

## 2017-09-25 DIAGNOSIS — I10 Essential (primary) hypertension: Secondary | ICD-10-CM | POA: Diagnosis not present

## 2017-09-25 DIAGNOSIS — T8189XD Other complications of procedures, not elsewhere classified, subsequent encounter: Secondary | ICD-10-CM | POA: Diagnosis not present

## 2017-09-25 DIAGNOSIS — E119 Type 2 diabetes mellitus without complications: Secondary | ICD-10-CM | POA: Diagnosis not present

## 2017-09-25 DIAGNOSIS — J189 Pneumonia, unspecified organism: Secondary | ICD-10-CM | POA: Diagnosis not present

## 2017-09-25 DIAGNOSIS — L7682 Other postprocedural complications of skin and subcutaneous tissue: Secondary | ICD-10-CM | POA: Diagnosis not present

## 2017-09-25 DIAGNOSIS — E8809 Other disorders of plasma-protein metabolism, not elsewhere classified: Secondary | ICD-10-CM | POA: Diagnosis not present

## 2017-09-26 ENCOUNTER — Encounter (INDEPENDENT_AMBULATORY_CARE_PROVIDER_SITE_OTHER): Payer: Medicare Other | Admitting: Ophthalmology

## 2017-09-26 DIAGNOSIS — H43813 Vitreous degeneration, bilateral: Secondary | ICD-10-CM

## 2017-09-26 DIAGNOSIS — I10 Essential (primary) hypertension: Secondary | ICD-10-CM

## 2017-09-26 DIAGNOSIS — H35033 Hypertensive retinopathy, bilateral: Secondary | ICD-10-CM

## 2017-09-26 DIAGNOSIS — E113313 Type 2 diabetes mellitus with moderate nonproliferative diabetic retinopathy with macular edema, bilateral: Secondary | ICD-10-CM | POA: Diagnosis not present

## 2017-09-26 DIAGNOSIS — E11311 Type 2 diabetes mellitus with unspecified diabetic retinopathy with macular edema: Secondary | ICD-10-CM

## 2017-09-26 DIAGNOSIS — D3132 Benign neoplasm of left choroid: Secondary | ICD-10-CM

## 2017-09-27 DIAGNOSIS — L7682 Other postprocedural complications of skin and subcutaneous tissue: Secondary | ICD-10-CM | POA: Diagnosis not present

## 2017-09-27 DIAGNOSIS — E8809 Other disorders of plasma-protein metabolism, not elsewhere classified: Secondary | ICD-10-CM | POA: Diagnosis not present

## 2017-09-27 DIAGNOSIS — I10 Essential (primary) hypertension: Secondary | ICD-10-CM | POA: Diagnosis not present

## 2017-09-27 DIAGNOSIS — J189 Pneumonia, unspecified organism: Secondary | ICD-10-CM | POA: Diagnosis not present

## 2017-09-27 DIAGNOSIS — E119 Type 2 diabetes mellitus without complications: Secondary | ICD-10-CM | POA: Diagnosis not present

## 2017-09-27 DIAGNOSIS — T8189XD Other complications of procedures, not elsewhere classified, subsequent encounter: Secondary | ICD-10-CM | POA: Diagnosis not present

## 2017-09-30 DIAGNOSIS — L7682 Other postprocedural complications of skin and subcutaneous tissue: Secondary | ICD-10-CM | POA: Diagnosis not present

## 2017-09-30 DIAGNOSIS — E8809 Other disorders of plasma-protein metabolism, not elsewhere classified: Secondary | ICD-10-CM | POA: Diagnosis not present

## 2017-09-30 DIAGNOSIS — J189 Pneumonia, unspecified organism: Secondary | ICD-10-CM | POA: Diagnosis not present

## 2017-09-30 DIAGNOSIS — I10 Essential (primary) hypertension: Secondary | ICD-10-CM | POA: Diagnosis not present

## 2017-09-30 DIAGNOSIS — E119 Type 2 diabetes mellitus without complications: Secondary | ICD-10-CM | POA: Diagnosis not present

## 2017-09-30 DIAGNOSIS — T8189XD Other complications of procedures, not elsewhere classified, subsequent encounter: Secondary | ICD-10-CM | POA: Diagnosis not present

## 2017-10-01 ENCOUNTER — Other Ambulatory Visit: Payer: Self-pay | Admitting: Family Medicine

## 2017-10-02 ENCOUNTER — Ambulatory Visit (INDEPENDENT_AMBULATORY_CARE_PROVIDER_SITE_OTHER): Payer: Medicare Other | Admitting: Family Medicine

## 2017-10-02 ENCOUNTER — Encounter: Payer: Self-pay | Admitting: Family Medicine

## 2017-10-02 DIAGNOSIS — I10 Essential (primary) hypertension: Secondary | ICD-10-CM | POA: Diagnosis not present

## 2017-10-02 DIAGNOSIS — E669 Obesity, unspecified: Secondary | ICD-10-CM | POA: Diagnosis not present

## 2017-10-02 DIAGNOSIS — E119 Type 2 diabetes mellitus without complications: Secondary | ICD-10-CM | POA: Diagnosis not present

## 2017-10-02 DIAGNOSIS — J189 Pneumonia, unspecified organism: Secondary | ICD-10-CM | POA: Diagnosis not present

## 2017-10-02 DIAGNOSIS — E8809 Other disorders of plasma-protein metabolism, not elsewhere classified: Secondary | ICD-10-CM | POA: Diagnosis not present

## 2017-10-02 DIAGNOSIS — T8189XD Other complications of procedures, not elsewhere classified, subsequent encounter: Secondary | ICD-10-CM | POA: Diagnosis not present

## 2017-10-02 DIAGNOSIS — E1169 Type 2 diabetes mellitus with other specified complication: Secondary | ICD-10-CM | POA: Diagnosis present

## 2017-10-02 DIAGNOSIS — L7682 Other postprocedural complications of skin and subcutaneous tissue: Secondary | ICD-10-CM | POA: Diagnosis not present

## 2017-10-02 NOTE — Assessment & Plan Note (Signed)
Stable

## 2017-10-02 NOTE — Progress Notes (Signed)
Subjective  Patient is presenting with the following illnesses  HYPERTENSION Disease Monitoring  Home BP Monitoring (Severity) has been controlled on nurse visit Symptoms - Chest pain- no    Dyspnea- no Medications (Modifying factors) Compliance-  Losartan 25 mg daily. Lightheadedness-  no  Edema- no Timing - continuous  Duration - years ROS - See HPI  PMH Lab Review   Potassium  Date Value Ref Range Status  09/13/2017 4.0 3.5 - 5.1 mmol/L Final   Sodium  Date Value Ref Range Status  09/13/2017 135 135 - 145 mmol/L Final  08/28/2017 136 134 - 144 mmol/L Final   Creat  Date Value Ref Range Status  06/27/2016 0.88 0.50 - 0.99 mg/dL Final    Comment:      For patients > or = 67 years of age: The upper reference limit for Creatinine is approximately 13% higher for people identified as African-American.      Creatinine, Ser  Date Value Ref Range Status  09/13/2017 0.58 0.44 - 1.00 mg/dL Final           DIABETES Disease Monitoring: Blood Sugar ranges(Severity) -usually in the 100s   Associated Symptoms- Polyuria/phagia/dipsia- no      Visual problems- no Medications: Compliance(Modifying factor) - daily amaryl Hypoglycemic symptoms- none Timing - continuous  Monitoring Labs and Parameters Last A1C:  Lab Results  Component Value Date   HGBA1C 6.1 08/23/2017   Last Lipid:     Component Value Date/Time   CHOL 206 (H) 12/12/2016 1115   HDL 57 12/12/2016 1115   LDLDIRECT 127 (H) 06/05/2017 1113   Last Bmet  Potassium  Date Value Ref Range Status  09/13/2017 4.0 3.5 - 5.1 mmol/L Final   Sodium  Date Value Ref Range Status  09/13/2017 135 135 - 145 mmol/L Final  08/28/2017 136 134 - 144 mmol/L Final   Creat  Date Value Ref Range Status  06/27/2016 0.88 0.50 - 0.99 mg/dL Final    Comment:      For patients > or = 67 years of age: The upper reference limit for Creatinine is approximately 13% higher for people identified as African-American.       Creatinine, Ser  Date Value Ref Range Status  09/13/2017 0.58 0.44 - 1.00 mg/dL Final        Chief Complaint noted Review of Symptoms - see HPI PMH - Smoking status noted.     Objective Vital Signs reviewed In good spirits walking around without assistance carrying her wound vac     Assessments/Plans  Diabetes mellitus type 2 in obese (HCC) Stable   Essential hypertension Stable    See after visit summary for details of patient instuctions

## 2017-10-02 NOTE — Patient Instructions (Addendum)
Good to see you today!  Thanks for coming in.  Start a multivitamin once off other supplements  Keep up the exercise  Come back in early April

## 2017-10-04 DIAGNOSIS — E119 Type 2 diabetes mellitus without complications: Secondary | ICD-10-CM | POA: Diagnosis not present

## 2017-10-04 DIAGNOSIS — I10 Essential (primary) hypertension: Secondary | ICD-10-CM | POA: Diagnosis not present

## 2017-10-04 DIAGNOSIS — E8809 Other disorders of plasma-protein metabolism, not elsewhere classified: Secondary | ICD-10-CM | POA: Diagnosis not present

## 2017-10-04 DIAGNOSIS — L7682 Other postprocedural complications of skin and subcutaneous tissue: Secondary | ICD-10-CM | POA: Diagnosis not present

## 2017-10-04 DIAGNOSIS — J189 Pneumonia, unspecified organism: Secondary | ICD-10-CM | POA: Diagnosis not present

## 2017-10-04 DIAGNOSIS — T8189XD Other complications of procedures, not elsewhere classified, subsequent encounter: Secondary | ICD-10-CM | POA: Diagnosis not present

## 2017-10-07 DIAGNOSIS — T8189XD Other complications of procedures, not elsewhere classified, subsequent encounter: Secondary | ICD-10-CM | POA: Diagnosis not present

## 2017-10-07 DIAGNOSIS — J189 Pneumonia, unspecified organism: Secondary | ICD-10-CM | POA: Diagnosis not present

## 2017-10-07 DIAGNOSIS — I10 Essential (primary) hypertension: Secondary | ICD-10-CM | POA: Diagnosis not present

## 2017-10-07 DIAGNOSIS — L7682 Other postprocedural complications of skin and subcutaneous tissue: Secondary | ICD-10-CM | POA: Diagnosis not present

## 2017-10-07 DIAGNOSIS — E119 Type 2 diabetes mellitus without complications: Secondary | ICD-10-CM | POA: Diagnosis not present

## 2017-10-07 DIAGNOSIS — E8809 Other disorders of plasma-protein metabolism, not elsewhere classified: Secondary | ICD-10-CM | POA: Diagnosis not present

## 2017-10-08 ENCOUNTER — Other Ambulatory Visit: Payer: Self-pay | Admitting: Family Medicine

## 2017-10-09 DIAGNOSIS — L7682 Other postprocedural complications of skin and subcutaneous tissue: Secondary | ICD-10-CM | POA: Diagnosis not present

## 2017-10-09 DIAGNOSIS — E8809 Other disorders of plasma-protein metabolism, not elsewhere classified: Secondary | ICD-10-CM | POA: Diagnosis not present

## 2017-10-09 DIAGNOSIS — E119 Type 2 diabetes mellitus without complications: Secondary | ICD-10-CM | POA: Diagnosis not present

## 2017-10-09 DIAGNOSIS — T8189XD Other complications of procedures, not elsewhere classified, subsequent encounter: Secondary | ICD-10-CM | POA: Diagnosis not present

## 2017-10-09 DIAGNOSIS — J189 Pneumonia, unspecified organism: Secondary | ICD-10-CM | POA: Diagnosis not present

## 2017-10-09 DIAGNOSIS — I10 Essential (primary) hypertension: Secondary | ICD-10-CM | POA: Diagnosis not present

## 2017-10-11 DIAGNOSIS — J189 Pneumonia, unspecified organism: Secondary | ICD-10-CM | POA: Diagnosis not present

## 2017-10-11 DIAGNOSIS — E119 Type 2 diabetes mellitus without complications: Secondary | ICD-10-CM | POA: Diagnosis not present

## 2017-10-11 DIAGNOSIS — I10 Essential (primary) hypertension: Secondary | ICD-10-CM | POA: Diagnosis not present

## 2017-10-11 DIAGNOSIS — L7682 Other postprocedural complications of skin and subcutaneous tissue: Secondary | ICD-10-CM | POA: Diagnosis not present

## 2017-10-11 DIAGNOSIS — E8809 Other disorders of plasma-protein metabolism, not elsewhere classified: Secondary | ICD-10-CM | POA: Diagnosis not present

## 2017-10-11 DIAGNOSIS — T8189XD Other complications of procedures, not elsewhere classified, subsequent encounter: Secondary | ICD-10-CM | POA: Diagnosis not present

## 2017-10-14 DIAGNOSIS — T8189XD Other complications of procedures, not elsewhere classified, subsequent encounter: Secondary | ICD-10-CM | POA: Diagnosis not present

## 2017-10-14 DIAGNOSIS — L7682 Other postprocedural complications of skin and subcutaneous tissue: Secondary | ICD-10-CM | POA: Diagnosis not present

## 2017-10-14 DIAGNOSIS — E8809 Other disorders of plasma-protein metabolism, not elsewhere classified: Secondary | ICD-10-CM | POA: Diagnosis not present

## 2017-10-14 DIAGNOSIS — I10 Essential (primary) hypertension: Secondary | ICD-10-CM | POA: Diagnosis not present

## 2017-10-14 DIAGNOSIS — J189 Pneumonia, unspecified organism: Secondary | ICD-10-CM | POA: Diagnosis not present

## 2017-10-14 DIAGNOSIS — E119 Type 2 diabetes mellitus without complications: Secondary | ICD-10-CM | POA: Diagnosis not present

## 2017-10-16 DIAGNOSIS — L7682 Other postprocedural complications of skin and subcutaneous tissue: Secondary | ICD-10-CM | POA: Diagnosis not present

## 2017-10-16 DIAGNOSIS — I10 Essential (primary) hypertension: Secondary | ICD-10-CM | POA: Diagnosis not present

## 2017-10-16 DIAGNOSIS — J189 Pneumonia, unspecified organism: Secondary | ICD-10-CM | POA: Diagnosis not present

## 2017-10-16 DIAGNOSIS — E119 Type 2 diabetes mellitus without complications: Secondary | ICD-10-CM | POA: Diagnosis not present

## 2017-10-16 DIAGNOSIS — E8809 Other disorders of plasma-protein metabolism, not elsewhere classified: Secondary | ICD-10-CM | POA: Diagnosis not present

## 2017-10-16 DIAGNOSIS — T8189XD Other complications of procedures, not elsewhere classified, subsequent encounter: Secondary | ICD-10-CM | POA: Diagnosis not present

## 2017-10-18 ENCOUNTER — Telehealth: Payer: Self-pay

## 2017-10-18 DIAGNOSIS — I10 Essential (primary) hypertension: Secondary | ICD-10-CM | POA: Diagnosis not present

## 2017-10-18 DIAGNOSIS — E8809 Other disorders of plasma-protein metabolism, not elsewhere classified: Secondary | ICD-10-CM | POA: Diagnosis not present

## 2017-10-18 DIAGNOSIS — T8189XD Other complications of procedures, not elsewhere classified, subsequent encounter: Secondary | ICD-10-CM | POA: Diagnosis not present

## 2017-10-18 DIAGNOSIS — J189 Pneumonia, unspecified organism: Secondary | ICD-10-CM | POA: Diagnosis not present

## 2017-10-18 DIAGNOSIS — E119 Type 2 diabetes mellitus without complications: Secondary | ICD-10-CM | POA: Diagnosis not present

## 2017-10-18 DIAGNOSIS — L7682 Other postprocedural complications of skin and subcutaneous tissue: Secondary | ICD-10-CM | POA: Diagnosis not present

## 2017-10-18 NOTE — Telephone Encounter (Signed)
Please call in the above order  Thanks  [place]

## 2017-10-18 NOTE — Telephone Encounter (Signed)
Davy Pique, RN with [facility], left message on nurse line requesting verbal order to re-certify patient for wound care for her wound vac which they change 3x/week.   Please call order to [phone].  Danley Danker, RN [facility] RN)

## 2017-10-21 DIAGNOSIS — E785 Hyperlipidemia, unspecified: Secondary | ICD-10-CM | POA: Diagnosis not present

## 2017-10-21 DIAGNOSIS — E8809 Other disorders of plasma-protein metabolism, not elsewhere classified: Secondary | ICD-10-CM | POA: Diagnosis not present

## 2017-10-21 DIAGNOSIS — I10 Essential (primary) hypertension: Secondary | ICD-10-CM | POA: Diagnosis not present

## 2017-10-21 DIAGNOSIS — Z8701 Personal history of pneumonia (recurrent): Secondary | ICD-10-CM | POA: Diagnosis not present

## 2017-10-21 DIAGNOSIS — T8189XD Other complications of procedures, not elsewhere classified, subsequent encounter: Secondary | ICD-10-CM | POA: Diagnosis not present

## 2017-10-21 DIAGNOSIS — L7682 Other postprocedural complications of skin and subcutaneous tissue: Secondary | ICD-10-CM | POA: Diagnosis not present

## 2017-10-21 DIAGNOSIS — E119 Type 2 diabetes mellitus without complications: Secondary | ICD-10-CM | POA: Diagnosis not present

## 2017-10-21 NOTE — Telephone Encounter (Signed)
Verbal orders given to [facility]. Danley Danker, RN [facility] RN)

## 2017-10-23 DIAGNOSIS — E785 Hyperlipidemia, unspecified: Secondary | ICD-10-CM | POA: Diagnosis not present

## 2017-10-23 DIAGNOSIS — E119 Type 2 diabetes mellitus without complications: Secondary | ICD-10-CM | POA: Diagnosis not present

## 2017-10-23 DIAGNOSIS — T8189XD Other complications of procedures, not elsewhere classified, subsequent encounter: Secondary | ICD-10-CM | POA: Diagnosis not present

## 2017-10-23 DIAGNOSIS — E8809 Other disorders of plasma-protein metabolism, not elsewhere classified: Secondary | ICD-10-CM | POA: Diagnosis not present

## 2017-10-23 DIAGNOSIS — L7682 Other postprocedural complications of skin and subcutaneous tissue: Secondary | ICD-10-CM | POA: Diagnosis not present

## 2017-10-23 DIAGNOSIS — I10 Essential (primary) hypertension: Secondary | ICD-10-CM | POA: Diagnosis not present

## 2017-10-25 DIAGNOSIS — E785 Hyperlipidemia, unspecified: Secondary | ICD-10-CM | POA: Diagnosis not present

## 2017-10-25 DIAGNOSIS — I10 Essential (primary) hypertension: Secondary | ICD-10-CM | POA: Diagnosis not present

## 2017-10-25 DIAGNOSIS — L7682 Other postprocedural complications of skin and subcutaneous tissue: Secondary | ICD-10-CM | POA: Diagnosis not present

## 2017-10-25 DIAGNOSIS — E119 Type 2 diabetes mellitus without complications: Secondary | ICD-10-CM | POA: Diagnosis not present

## 2017-10-25 DIAGNOSIS — T8189XD Other complications of procedures, not elsewhere classified, subsequent encounter: Secondary | ICD-10-CM | POA: Diagnosis not present

## 2017-10-25 DIAGNOSIS — E8809 Other disorders of plasma-protein metabolism, not elsewhere classified: Secondary | ICD-10-CM | POA: Diagnosis not present

## 2017-10-28 DIAGNOSIS — E119 Type 2 diabetes mellitus without complications: Secondary | ICD-10-CM | POA: Diagnosis not present

## 2017-10-28 DIAGNOSIS — E8809 Other disorders of plasma-protein metabolism, not elsewhere classified: Secondary | ICD-10-CM | POA: Diagnosis not present

## 2017-10-28 DIAGNOSIS — I10 Essential (primary) hypertension: Secondary | ICD-10-CM | POA: Diagnosis not present

## 2017-10-28 DIAGNOSIS — E785 Hyperlipidemia, unspecified: Secondary | ICD-10-CM | POA: Diagnosis not present

## 2017-10-28 DIAGNOSIS — T8189XD Other complications of procedures, not elsewhere classified, subsequent encounter: Secondary | ICD-10-CM | POA: Diagnosis not present

## 2017-10-28 DIAGNOSIS — L7682 Other postprocedural complications of skin and subcutaneous tissue: Secondary | ICD-10-CM | POA: Diagnosis not present

## 2017-10-30 DIAGNOSIS — E119 Type 2 diabetes mellitus without complications: Secondary | ICD-10-CM | POA: Diagnosis not present

## 2017-10-30 DIAGNOSIS — L7682 Other postprocedural complications of skin and subcutaneous tissue: Secondary | ICD-10-CM | POA: Diagnosis not present

## 2017-10-30 DIAGNOSIS — I10 Essential (primary) hypertension: Secondary | ICD-10-CM | POA: Diagnosis not present

## 2017-10-30 DIAGNOSIS — E8809 Other disorders of plasma-protein metabolism, not elsewhere classified: Secondary | ICD-10-CM | POA: Diagnosis not present

## 2017-10-30 DIAGNOSIS — T8189XD Other complications of procedures, not elsewhere classified, subsequent encounter: Secondary | ICD-10-CM | POA: Diagnosis not present

## 2017-10-30 DIAGNOSIS — E785 Hyperlipidemia, unspecified: Secondary | ICD-10-CM | POA: Diagnosis not present

## 2017-10-31 DIAGNOSIS — E119 Type 2 diabetes mellitus without complications: Secondary | ICD-10-CM | POA: Diagnosis not present

## 2017-10-31 DIAGNOSIS — E8809 Other disorders of plasma-protein metabolism, not elsewhere classified: Secondary | ICD-10-CM | POA: Diagnosis not present

## 2017-10-31 DIAGNOSIS — E785 Hyperlipidemia, unspecified: Secondary | ICD-10-CM | POA: Diagnosis not present

## 2017-10-31 DIAGNOSIS — T8189XD Other complications of procedures, not elsewhere classified, subsequent encounter: Secondary | ICD-10-CM | POA: Diagnosis not present

## 2017-10-31 DIAGNOSIS — L7682 Other postprocedural complications of skin and subcutaneous tissue: Secondary | ICD-10-CM | POA: Diagnosis not present

## 2017-10-31 DIAGNOSIS — I10 Essential (primary) hypertension: Secondary | ICD-10-CM | POA: Diagnosis not present

## 2017-11-01 DIAGNOSIS — T8189XD Other complications of procedures, not elsewhere classified, subsequent encounter: Secondary | ICD-10-CM | POA: Diagnosis not present

## 2017-11-01 DIAGNOSIS — E785 Hyperlipidemia, unspecified: Secondary | ICD-10-CM | POA: Diagnosis not present

## 2017-11-01 DIAGNOSIS — I10 Essential (primary) hypertension: Secondary | ICD-10-CM | POA: Diagnosis not present

## 2017-11-01 DIAGNOSIS — E8809 Other disorders of plasma-protein metabolism, not elsewhere classified: Secondary | ICD-10-CM | POA: Diagnosis not present

## 2017-11-01 DIAGNOSIS — L7682 Other postprocedural complications of skin and subcutaneous tissue: Secondary | ICD-10-CM | POA: Diagnosis not present

## 2017-11-01 DIAGNOSIS — E119 Type 2 diabetes mellitus without complications: Secondary | ICD-10-CM | POA: Diagnosis not present

## 2017-11-02 ENCOUNTER — Other Ambulatory Visit: Payer: Self-pay | Admitting: Family Medicine

## 2017-11-04 DIAGNOSIS — E785 Hyperlipidemia, unspecified: Secondary | ICD-10-CM | POA: Diagnosis not present

## 2017-11-04 DIAGNOSIS — L7682 Other postprocedural complications of skin and subcutaneous tissue: Secondary | ICD-10-CM | POA: Diagnosis not present

## 2017-11-04 DIAGNOSIS — T8189XD Other complications of procedures, not elsewhere classified, subsequent encounter: Secondary | ICD-10-CM | POA: Diagnosis not present

## 2017-11-04 DIAGNOSIS — E119 Type 2 diabetes mellitus without complications: Secondary | ICD-10-CM | POA: Diagnosis not present

## 2017-11-04 DIAGNOSIS — I10 Essential (primary) hypertension: Secondary | ICD-10-CM | POA: Diagnosis not present

## 2017-11-04 DIAGNOSIS — E8809 Other disorders of plasma-protein metabolism, not elsewhere classified: Secondary | ICD-10-CM | POA: Diagnosis not present

## 2017-11-05 ENCOUNTER — Ambulatory Visit: Payer: Medicare Other | Admitting: Family Medicine

## 2017-11-06 ENCOUNTER — Ambulatory Visit: Payer: Medicare Other | Admitting: Family Medicine

## 2017-11-06 DIAGNOSIS — I10 Essential (primary) hypertension: Secondary | ICD-10-CM | POA: Diagnosis not present

## 2017-11-06 DIAGNOSIS — T8189XD Other complications of procedures, not elsewhere classified, subsequent encounter: Secondary | ICD-10-CM | POA: Diagnosis not present

## 2017-11-06 DIAGNOSIS — E785 Hyperlipidemia, unspecified: Secondary | ICD-10-CM | POA: Diagnosis not present

## 2017-11-06 DIAGNOSIS — E119 Type 2 diabetes mellitus without complications: Secondary | ICD-10-CM | POA: Diagnosis not present

## 2017-11-06 DIAGNOSIS — E8809 Other disorders of plasma-protein metabolism, not elsewhere classified: Secondary | ICD-10-CM | POA: Diagnosis not present

## 2017-11-06 DIAGNOSIS — L7682 Other postprocedural complications of skin and subcutaneous tissue: Secondary | ICD-10-CM | POA: Diagnosis not present

## 2017-11-08 DIAGNOSIS — T8189XD Other complications of procedures, not elsewhere classified, subsequent encounter: Secondary | ICD-10-CM | POA: Diagnosis not present

## 2017-11-08 DIAGNOSIS — I10 Essential (primary) hypertension: Secondary | ICD-10-CM | POA: Diagnosis not present

## 2017-11-08 DIAGNOSIS — L7682 Other postprocedural complications of skin and subcutaneous tissue: Secondary | ICD-10-CM | POA: Diagnosis not present

## 2017-11-08 DIAGNOSIS — E8809 Other disorders of plasma-protein metabolism, not elsewhere classified: Secondary | ICD-10-CM | POA: Diagnosis not present

## 2017-11-08 DIAGNOSIS — E785 Hyperlipidemia, unspecified: Secondary | ICD-10-CM | POA: Diagnosis not present

## 2017-11-08 DIAGNOSIS — E119 Type 2 diabetes mellitus without complications: Secondary | ICD-10-CM | POA: Diagnosis not present

## 2017-11-11 DIAGNOSIS — L7682 Other postprocedural complications of skin and subcutaneous tissue: Secondary | ICD-10-CM | POA: Diagnosis not present

## 2017-11-11 DIAGNOSIS — E785 Hyperlipidemia, unspecified: Secondary | ICD-10-CM | POA: Diagnosis not present

## 2017-11-11 DIAGNOSIS — I10 Essential (primary) hypertension: Secondary | ICD-10-CM | POA: Diagnosis not present

## 2017-11-11 DIAGNOSIS — T8189XD Other complications of procedures, not elsewhere classified, subsequent encounter: Secondary | ICD-10-CM | POA: Diagnosis not present

## 2017-11-11 DIAGNOSIS — E8809 Other disorders of plasma-protein metabolism, not elsewhere classified: Secondary | ICD-10-CM | POA: Diagnosis not present

## 2017-11-11 DIAGNOSIS — E119 Type 2 diabetes mellitus without complications: Secondary | ICD-10-CM | POA: Diagnosis not present

## 2017-11-13 ENCOUNTER — Encounter: Payer: Self-pay | Admitting: Family Medicine

## 2017-11-13 ENCOUNTER — Ambulatory Visit (INDEPENDENT_AMBULATORY_CARE_PROVIDER_SITE_OTHER): Payer: Medicare Other | Admitting: Family Medicine

## 2017-11-13 VITALS — BP 140/80 | HR 79 | Temp 97.7°F | Wt 209.0 lb

## 2017-11-13 DIAGNOSIS — E119 Type 2 diabetes mellitus without complications: Secondary | ICD-10-CM | POA: Diagnosis not present

## 2017-11-13 DIAGNOSIS — M25579 Pain in unspecified ankle and joints of unspecified foot: Secondary | ICD-10-CM | POA: Insufficient documentation

## 2017-11-13 DIAGNOSIS — T8189XD Other complications of procedures, not elsewhere classified, subsequent encounter: Secondary | ICD-10-CM | POA: Diagnosis not present

## 2017-11-13 DIAGNOSIS — M25571 Pain in right ankle and joints of right foot: Secondary | ICD-10-CM

## 2017-11-13 DIAGNOSIS — L7682 Other postprocedural complications of skin and subcutaneous tissue: Secondary | ICD-10-CM | POA: Diagnosis not present

## 2017-11-13 DIAGNOSIS — E669 Obesity, unspecified: Secondary | ICD-10-CM | POA: Diagnosis not present

## 2017-11-13 DIAGNOSIS — I10 Essential (primary) hypertension: Secondary | ICD-10-CM

## 2017-11-13 DIAGNOSIS — E1169 Type 2 diabetes mellitus with other specified complication: Secondary | ICD-10-CM | POA: Diagnosis present

## 2017-11-13 DIAGNOSIS — E8809 Other disorders of plasma-protein metabolism, not elsewhere classified: Secondary | ICD-10-CM | POA: Diagnosis not present

## 2017-11-13 DIAGNOSIS — E785 Hyperlipidemia, unspecified: Secondary | ICD-10-CM | POA: Diagnosis not present

## 2017-11-13 LAB — POCT GLYCOSYLATED HEMOGLOBIN (HGB A1C): Hemoglobin A1C: 5.5

## 2017-11-13 NOTE — Assessment & Plan Note (Signed)
Well controlled 

## 2017-11-13 NOTE — Progress Notes (Addendum)
Subjective  Patient is presenting with the following illnesses  HYPERTENSION Disease Monitoring: Blood pressure range-has been well controlled forgot her readings Chest pain, palpitations- no      Dyspnea- no Medications: Compliance- daily losartan 25 mg Lightheadedness,Syncope- no   Edema- no  DIABETES Disease Monitoring: Blood Sugar ranges-not checking Polyuria/phagia/dipsia- no      Visual problems- no Medications: Compliance- daily amaryl Hypoglycemic symptoms- no  FOOT PAIN R Rolled over in bed this am and felt pain in lateral mid R foot.  No other trauma or exposure.  Able to walk but tender.  This is a little swollen  Monitoring Labs and Parameters Last A1C:  Lab Results  Component Value Date   HGBA1C 5.5 11/13/2017    Last Lipid:     Component Value Date/Time   CHOL 206 (H) 12/12/2016 1115   HDL 57 12/12/2016 1115   LDLDIRECT 127 (H) 06/05/2017 1113    Last Bmet  Potassium  Date Value Ref Range Status  09/13/2017 4.0 3.5 - 5.1 mmol/L Final   Sodium  Date Value Ref Range Status  09/13/2017 135 135 - 145 mmol/L Final  08/28/2017 136 134 - 144 mmol/L Final   Creat  Date Value Ref Range Status  06/27/2016 0.88 0.50 - 0.99 mg/dL Final    Comment:      For patients > or = 67 years of age: The upper reference limit for Creatinine is approximately 13% higher for people identified as African-American.      Creatinine, Ser  Date Value Ref Range Status  09/13/2017 0.58 0.44 - 1.00 mg/dL Final      Last BPs:  BP Readings from Last 3 Encounters:  11/13/17 140/80  10/02/17 121/80  09/17/17 132/60    Chief Complaint noted Review of Symptoms - see HPI PMH - Smoking status noted.     Objective Vital Signs reviewed Diabetic Foot Check -  Appearance - no lesions, ulcers or calluses Skin - no unusual pallor or redness Monofilament testing -  Right - Great toe, medial, central, lateral ball and posterior foot intact Left - Great toe, medial, central,  lateral ball and posterior foot intact R Foot tender area mid 5h metatarsal.  No deformity mildly red with slight soft tissue swelling Able to walk     Assessments/Plans  Diabetes mellitus type 2 in obese (HCC) Well controlled will stop amaryl and observe for 3 months  Essential hypertension Well controlled  Pain in joint, ankle and foot Seems consistent with mild strain.  Patient would like to wait for a day or two before xray   HM - had colonoscopy in [facility]? In 2015   See after visit summary for details of patient instuctions

## 2017-11-13 NOTE — Assessment & Plan Note (Signed)
Well controlled will stop amaryl and observe for 3 months

## 2017-11-13 NOTE — Assessment & Plan Note (Signed)
Seems consistent with mild strain.  Patient would like to wait for a day or two before xray

## 2017-11-13 NOTE — Patient Instructions (Addendum)
Good to see you today!  Thanks for coming in.  Your diabetes is doing well - Stop the glimiperide  Keep walking  If your foot is not better in two days call me for an xray  Send me the name of your gastroenterologist  Come back in 3 months

## 2017-11-15 ENCOUNTER — Encounter: Payer: Self-pay | Admitting: Family Medicine

## 2017-11-15 DIAGNOSIS — I10 Essential (primary) hypertension: Secondary | ICD-10-CM | POA: Diagnosis not present

## 2017-11-15 DIAGNOSIS — L7682 Other postprocedural complications of skin and subcutaneous tissue: Secondary | ICD-10-CM | POA: Diagnosis not present

## 2017-11-15 DIAGNOSIS — E8809 Other disorders of plasma-protein metabolism, not elsewhere classified: Secondary | ICD-10-CM | POA: Diagnosis not present

## 2017-11-15 DIAGNOSIS — T8189XD Other complications of procedures, not elsewhere classified, subsequent encounter: Secondary | ICD-10-CM | POA: Diagnosis not present

## 2017-11-15 DIAGNOSIS — E119 Type 2 diabetes mellitus without complications: Secondary | ICD-10-CM | POA: Diagnosis not present

## 2017-11-15 DIAGNOSIS — E785 Hyperlipidemia, unspecified: Secondary | ICD-10-CM | POA: Diagnosis not present

## 2017-11-18 DIAGNOSIS — T8189XD Other complications of procedures, not elsewhere classified, subsequent encounter: Secondary | ICD-10-CM | POA: Diagnosis not present

## 2017-11-18 DIAGNOSIS — I10 Essential (primary) hypertension: Secondary | ICD-10-CM | POA: Diagnosis not present

## 2017-11-18 DIAGNOSIS — L7682 Other postprocedural complications of skin and subcutaneous tissue: Secondary | ICD-10-CM | POA: Diagnosis not present

## 2017-11-18 DIAGNOSIS — E785 Hyperlipidemia, unspecified: Secondary | ICD-10-CM | POA: Diagnosis not present

## 2017-11-18 DIAGNOSIS — E119 Type 2 diabetes mellitus without complications: Secondary | ICD-10-CM | POA: Diagnosis not present

## 2017-11-18 DIAGNOSIS — E8809 Other disorders of plasma-protein metabolism, not elsewhere classified: Secondary | ICD-10-CM | POA: Diagnosis not present

## 2017-11-20 DIAGNOSIS — I10 Essential (primary) hypertension: Secondary | ICD-10-CM | POA: Diagnosis not present

## 2017-11-20 DIAGNOSIS — E8809 Other disorders of plasma-protein metabolism, not elsewhere classified: Secondary | ICD-10-CM | POA: Diagnosis not present

## 2017-11-20 DIAGNOSIS — L7682 Other postprocedural complications of skin and subcutaneous tissue: Secondary | ICD-10-CM | POA: Diagnosis not present

## 2017-11-20 DIAGNOSIS — E119 Type 2 diabetes mellitus without complications: Secondary | ICD-10-CM | POA: Diagnosis not present

## 2017-11-20 DIAGNOSIS — T8189XD Other complications of procedures, not elsewhere classified, subsequent encounter: Secondary | ICD-10-CM | POA: Diagnosis not present

## 2017-11-20 DIAGNOSIS — E785 Hyperlipidemia, unspecified: Secondary | ICD-10-CM | POA: Diagnosis not present

## 2017-11-22 DIAGNOSIS — I10 Essential (primary) hypertension: Secondary | ICD-10-CM | POA: Diagnosis not present

## 2017-11-22 DIAGNOSIS — E785 Hyperlipidemia, unspecified: Secondary | ICD-10-CM | POA: Diagnosis not present

## 2017-11-22 DIAGNOSIS — E119 Type 2 diabetes mellitus without complications: Secondary | ICD-10-CM | POA: Diagnosis not present

## 2017-11-22 DIAGNOSIS — L7682 Other postprocedural complications of skin and subcutaneous tissue: Secondary | ICD-10-CM | POA: Diagnosis not present

## 2017-11-22 DIAGNOSIS — E8809 Other disorders of plasma-protein metabolism, not elsewhere classified: Secondary | ICD-10-CM | POA: Diagnosis not present

## 2017-11-22 DIAGNOSIS — T8189XD Other complications of procedures, not elsewhere classified, subsequent encounter: Secondary | ICD-10-CM | POA: Diagnosis not present

## 2017-11-25 DIAGNOSIS — I10 Essential (primary) hypertension: Secondary | ICD-10-CM | POA: Diagnosis not present

## 2017-11-25 DIAGNOSIS — E119 Type 2 diabetes mellitus without complications: Secondary | ICD-10-CM | POA: Diagnosis not present

## 2017-11-25 DIAGNOSIS — L7682 Other postprocedural complications of skin and subcutaneous tissue: Secondary | ICD-10-CM | POA: Diagnosis not present

## 2017-11-25 DIAGNOSIS — T8189XD Other complications of procedures, not elsewhere classified, subsequent encounter: Secondary | ICD-10-CM | POA: Diagnosis not present

## 2017-11-25 DIAGNOSIS — E8809 Other disorders of plasma-protein metabolism, not elsewhere classified: Secondary | ICD-10-CM | POA: Diagnosis not present

## 2017-11-25 DIAGNOSIS — E785 Hyperlipidemia, unspecified: Secondary | ICD-10-CM | POA: Diagnosis not present

## 2017-11-27 DIAGNOSIS — L7682 Other postprocedural complications of skin and subcutaneous tissue: Secondary | ICD-10-CM | POA: Diagnosis not present

## 2017-11-27 DIAGNOSIS — T8189XD Other complications of procedures, not elsewhere classified, subsequent encounter: Secondary | ICD-10-CM | POA: Diagnosis not present

## 2017-11-27 DIAGNOSIS — E8809 Other disorders of plasma-protein metabolism, not elsewhere classified: Secondary | ICD-10-CM | POA: Diagnosis not present

## 2017-11-27 DIAGNOSIS — E785 Hyperlipidemia, unspecified: Secondary | ICD-10-CM | POA: Diagnosis not present

## 2017-11-27 DIAGNOSIS — I10 Essential (primary) hypertension: Secondary | ICD-10-CM | POA: Diagnosis not present

## 2017-11-27 DIAGNOSIS — E119 Type 2 diabetes mellitus without complications: Secondary | ICD-10-CM | POA: Diagnosis not present

## 2017-11-29 DIAGNOSIS — E8809 Other disorders of plasma-protein metabolism, not elsewhere classified: Secondary | ICD-10-CM | POA: Diagnosis not present

## 2017-11-29 DIAGNOSIS — E785 Hyperlipidemia, unspecified: Secondary | ICD-10-CM | POA: Diagnosis not present

## 2017-11-29 DIAGNOSIS — L7682 Other postprocedural complications of skin and subcutaneous tissue: Secondary | ICD-10-CM | POA: Diagnosis not present

## 2017-11-29 DIAGNOSIS — T8189XD Other complications of procedures, not elsewhere classified, subsequent encounter: Secondary | ICD-10-CM | POA: Diagnosis not present

## 2017-11-29 DIAGNOSIS — I10 Essential (primary) hypertension: Secondary | ICD-10-CM | POA: Diagnosis not present

## 2017-11-29 DIAGNOSIS — E119 Type 2 diabetes mellitus without complications: Secondary | ICD-10-CM | POA: Diagnosis not present

## 2017-12-02 DIAGNOSIS — E8809 Other disorders of plasma-protein metabolism, not elsewhere classified: Secondary | ICD-10-CM | POA: Diagnosis not present

## 2017-12-02 DIAGNOSIS — L7682 Other postprocedural complications of skin and subcutaneous tissue: Secondary | ICD-10-CM | POA: Diagnosis not present

## 2017-12-02 DIAGNOSIS — I10 Essential (primary) hypertension: Secondary | ICD-10-CM | POA: Diagnosis not present

## 2017-12-02 DIAGNOSIS — E785 Hyperlipidemia, unspecified: Secondary | ICD-10-CM | POA: Diagnosis not present

## 2017-12-02 DIAGNOSIS — T8189XD Other complications of procedures, not elsewhere classified, subsequent encounter: Secondary | ICD-10-CM | POA: Diagnosis not present

## 2017-12-02 DIAGNOSIS — E119 Type 2 diabetes mellitus without complications: Secondary | ICD-10-CM | POA: Diagnosis not present

## 2017-12-04 DIAGNOSIS — L7682 Other postprocedural complications of skin and subcutaneous tissue: Secondary | ICD-10-CM | POA: Diagnosis not present

## 2017-12-04 DIAGNOSIS — T8189XD Other complications of procedures, not elsewhere classified, subsequent encounter: Secondary | ICD-10-CM | POA: Diagnosis not present

## 2017-12-04 DIAGNOSIS — E785 Hyperlipidemia, unspecified: Secondary | ICD-10-CM | POA: Diagnosis not present

## 2017-12-04 DIAGNOSIS — I10 Essential (primary) hypertension: Secondary | ICD-10-CM | POA: Diagnosis not present

## 2017-12-04 DIAGNOSIS — E8809 Other disorders of plasma-protein metabolism, not elsewhere classified: Secondary | ICD-10-CM | POA: Diagnosis not present

## 2017-12-04 DIAGNOSIS — E119 Type 2 diabetes mellitus without complications: Secondary | ICD-10-CM | POA: Diagnosis not present

## 2017-12-05 ENCOUNTER — Encounter (INDEPENDENT_AMBULATORY_CARE_PROVIDER_SITE_OTHER): Payer: Medicare Other | Admitting: Ophthalmology

## 2017-12-05 DIAGNOSIS — E11311 Type 2 diabetes mellitus with unspecified diabetic retinopathy with macular edema: Secondary | ICD-10-CM | POA: Diagnosis not present

## 2017-12-05 DIAGNOSIS — I10 Essential (primary) hypertension: Secondary | ICD-10-CM | POA: Diagnosis not present

## 2017-12-05 DIAGNOSIS — D3132 Benign neoplasm of left choroid: Secondary | ICD-10-CM

## 2017-12-05 DIAGNOSIS — E113313 Type 2 diabetes mellitus with moderate nonproliferative diabetic retinopathy with macular edema, bilateral: Secondary | ICD-10-CM

## 2017-12-05 DIAGNOSIS — H35033 Hypertensive retinopathy, bilateral: Secondary | ICD-10-CM | POA: Diagnosis not present

## 2017-12-05 DIAGNOSIS — H43813 Vitreous degeneration, bilateral: Secondary | ICD-10-CM

## 2017-12-06 DIAGNOSIS — E8809 Other disorders of plasma-protein metabolism, not elsewhere classified: Secondary | ICD-10-CM | POA: Diagnosis not present

## 2017-12-06 DIAGNOSIS — E119 Type 2 diabetes mellitus without complications: Secondary | ICD-10-CM | POA: Diagnosis not present

## 2017-12-06 DIAGNOSIS — T8189XD Other complications of procedures, not elsewhere classified, subsequent encounter: Secondary | ICD-10-CM | POA: Diagnosis not present

## 2017-12-06 DIAGNOSIS — E785 Hyperlipidemia, unspecified: Secondary | ICD-10-CM | POA: Diagnosis not present

## 2017-12-06 DIAGNOSIS — I10 Essential (primary) hypertension: Secondary | ICD-10-CM | POA: Diagnosis not present

## 2017-12-06 DIAGNOSIS — L7682 Other postprocedural complications of skin and subcutaneous tissue: Secondary | ICD-10-CM | POA: Diagnosis not present

## 2017-12-09 DIAGNOSIS — I10 Essential (primary) hypertension: Secondary | ICD-10-CM | POA: Diagnosis not present

## 2017-12-09 DIAGNOSIS — E785 Hyperlipidemia, unspecified: Secondary | ICD-10-CM | POA: Diagnosis not present

## 2017-12-09 DIAGNOSIS — L7682 Other postprocedural complications of skin and subcutaneous tissue: Secondary | ICD-10-CM | POA: Diagnosis not present

## 2017-12-09 DIAGNOSIS — E8809 Other disorders of plasma-protein metabolism, not elsewhere classified: Secondary | ICD-10-CM | POA: Diagnosis not present

## 2017-12-09 DIAGNOSIS — T8189XD Other complications of procedures, not elsewhere classified, subsequent encounter: Secondary | ICD-10-CM | POA: Diagnosis not present

## 2017-12-09 DIAGNOSIS — E119 Type 2 diabetes mellitus without complications: Secondary | ICD-10-CM | POA: Diagnosis not present

## 2017-12-10 ENCOUNTER — Telehealth: Payer: Self-pay | Admitting: Family Medicine

## 2017-12-10 NOTE — Telephone Encounter (Signed)
Pts daughter called and wanted to make sure someone had faxed the request to renew her mothers wound vac supplies. She said [facility] had spoke with someone from our office this morning and said they would do it today.  Pt was  double checking because she said it is urgent. She said she does not have their fax number but wanted to leave a telephone number. [phone]

## 2017-12-11 DIAGNOSIS — I10 Essential (primary) hypertension: Secondary | ICD-10-CM | POA: Diagnosis not present

## 2017-12-11 DIAGNOSIS — T8189XD Other complications of procedures, not elsewhere classified, subsequent encounter: Secondary | ICD-10-CM | POA: Diagnosis not present

## 2017-12-11 DIAGNOSIS — E785 Hyperlipidemia, unspecified: Secondary | ICD-10-CM | POA: Diagnosis not present

## 2017-12-11 DIAGNOSIS — L7682 Other postprocedural complications of skin and subcutaneous tissue: Secondary | ICD-10-CM | POA: Diagnosis not present

## 2017-12-11 DIAGNOSIS — E119 Type 2 diabetes mellitus without complications: Secondary | ICD-10-CM | POA: Diagnosis not present

## 2017-12-11 DIAGNOSIS — E8809 Other disorders of plasma-protein metabolism, not elsewhere classified: Secondary | ICD-10-CM | POA: Diagnosis not present

## 2017-12-11 NOTE — Telephone Encounter (Signed)
Pt daughter called again and was checking on the status of someone talking to [facility], I told her the note was sent and waiting for Dr to approve it. She wanted me to let the team know, she will be calling every day until she is contacted.

## 2017-12-11 NOTE — Telephone Encounter (Signed)
FYI  Dr. Erin Hearing,   I called [facility] and was told that they have everything that they need from [place]. The only thing that was missing was the order and they now have it. The order was placed yesterday for the supplies and was scheduled to be sent out today.  I have called and left a message for the patient and her daughter [name] of the above conversation.Ozella Almond, CMA

## 2017-12-11 NOTE — Telephone Encounter (Signed)
I have not received a fax from [facility].  Please ask daughter to call them and request they send me another fax and that they have our fax number correct.  Thanks  [place]

## 2017-12-13 DIAGNOSIS — E8809 Other disorders of plasma-protein metabolism, not elsewhere classified: Secondary | ICD-10-CM | POA: Diagnosis not present

## 2017-12-13 DIAGNOSIS — E119 Type 2 diabetes mellitus without complications: Secondary | ICD-10-CM | POA: Diagnosis not present

## 2017-12-13 DIAGNOSIS — T8189XD Other complications of procedures, not elsewhere classified, subsequent encounter: Secondary | ICD-10-CM | POA: Diagnosis not present

## 2017-12-13 DIAGNOSIS — I10 Essential (primary) hypertension: Secondary | ICD-10-CM | POA: Diagnosis not present

## 2017-12-13 DIAGNOSIS — E785 Hyperlipidemia, unspecified: Secondary | ICD-10-CM | POA: Diagnosis not present

## 2017-12-13 DIAGNOSIS — L7682 Other postprocedural complications of skin and subcutaneous tissue: Secondary | ICD-10-CM | POA: Diagnosis not present

## 2017-12-16 DIAGNOSIS — T8189XD Other complications of procedures, not elsewhere classified, subsequent encounter: Secondary | ICD-10-CM | POA: Diagnosis not present

## 2017-12-16 DIAGNOSIS — L7682 Other postprocedural complications of skin and subcutaneous tissue: Secondary | ICD-10-CM | POA: Diagnosis not present

## 2017-12-16 DIAGNOSIS — E119 Type 2 diabetes mellitus without complications: Secondary | ICD-10-CM | POA: Diagnosis not present

## 2017-12-16 DIAGNOSIS — I10 Essential (primary) hypertension: Secondary | ICD-10-CM | POA: Diagnosis not present

## 2017-12-16 DIAGNOSIS — E785 Hyperlipidemia, unspecified: Secondary | ICD-10-CM | POA: Diagnosis not present

## 2017-12-16 DIAGNOSIS — E8809 Other disorders of plasma-protein metabolism, not elsewhere classified: Secondary | ICD-10-CM | POA: Diagnosis not present

## 2017-12-18 ENCOUNTER — Telehealth: Payer: Self-pay

## 2017-12-18 DIAGNOSIS — E785 Hyperlipidemia, unspecified: Secondary | ICD-10-CM | POA: Diagnosis not present

## 2017-12-18 DIAGNOSIS — H318 Other specified disorders of choroid: Secondary | ICD-10-CM | POA: Diagnosis not present

## 2017-12-18 DIAGNOSIS — I10 Essential (primary) hypertension: Secondary | ICD-10-CM | POA: Diagnosis not present

## 2017-12-18 DIAGNOSIS — E119 Type 2 diabetes mellitus without complications: Secondary | ICD-10-CM | POA: Diagnosis not present

## 2017-12-18 DIAGNOSIS — T8189XD Other complications of procedures, not elsewhere classified, subsequent encounter: Secondary | ICD-10-CM | POA: Diagnosis not present

## 2017-12-18 DIAGNOSIS — L7682 Other postprocedural complications of skin and subcutaneous tissue: Secondary | ICD-10-CM | POA: Diagnosis not present

## 2017-12-18 DIAGNOSIS — E8809 Other disorders of plasma-protein metabolism, not elsewhere classified: Secondary | ICD-10-CM | POA: Diagnosis not present

## 2017-12-18 NOTE — Telephone Encounter (Signed)
Sonya with [facility] calling regarding pt. She is up for re certification, and will need orders to continue seeing her. Would also like to speak to MD about wound care orders. Her call back [phone] Wallace Cullens, RN

## 2017-12-19 NOTE — Telephone Encounter (Signed)
Spoke with nurse.  She will send me orders to sign She recommendations wound vac discontinuation

## 2017-12-20 DIAGNOSIS — T8189XD Other complications of procedures, not elsewhere classified, subsequent encounter: Secondary | ICD-10-CM | POA: Diagnosis not present

## 2017-12-20 DIAGNOSIS — L7682 Other postprocedural complications of skin and subcutaneous tissue: Secondary | ICD-10-CM | POA: Diagnosis not present

## 2017-12-20 DIAGNOSIS — E8809 Other disorders of plasma-protein metabolism, not elsewhere classified: Secondary | ICD-10-CM | POA: Diagnosis not present

## 2017-12-20 DIAGNOSIS — E119 Type 2 diabetes mellitus without complications: Secondary | ICD-10-CM | POA: Diagnosis not present

## 2017-12-20 DIAGNOSIS — E785 Hyperlipidemia, unspecified: Secondary | ICD-10-CM | POA: Diagnosis not present

## 2017-12-20 DIAGNOSIS — I10 Essential (primary) hypertension: Secondary | ICD-10-CM | POA: Diagnosis not present

## 2017-12-20 DIAGNOSIS — Z8701 Personal history of pneumonia (recurrent): Secondary | ICD-10-CM | POA: Diagnosis not present

## 2017-12-23 DIAGNOSIS — E119 Type 2 diabetes mellitus without complications: Secondary | ICD-10-CM | POA: Diagnosis not present

## 2017-12-23 DIAGNOSIS — E785 Hyperlipidemia, unspecified: Secondary | ICD-10-CM | POA: Diagnosis not present

## 2017-12-23 DIAGNOSIS — T8189XD Other complications of procedures, not elsewhere classified, subsequent encounter: Secondary | ICD-10-CM | POA: Diagnosis not present

## 2017-12-23 DIAGNOSIS — I10 Essential (primary) hypertension: Secondary | ICD-10-CM | POA: Diagnosis not present

## 2017-12-23 DIAGNOSIS — E8809 Other disorders of plasma-protein metabolism, not elsewhere classified: Secondary | ICD-10-CM | POA: Diagnosis not present

## 2017-12-23 DIAGNOSIS — L7682 Other postprocedural complications of skin and subcutaneous tissue: Secondary | ICD-10-CM | POA: Diagnosis not present

## 2017-12-24 ENCOUNTER — Other Ambulatory Visit: Payer: Self-pay | Admitting: Family Medicine

## 2017-12-25 ENCOUNTER — Ambulatory Visit (INDEPENDENT_AMBULATORY_CARE_PROVIDER_SITE_OTHER): Payer: Medicare Other | Admitting: Family Medicine

## 2017-12-25 ENCOUNTER — Telehealth: Payer: Self-pay

## 2017-12-25 ENCOUNTER — Encounter: Payer: Self-pay | Admitting: Family Medicine

## 2017-12-25 DIAGNOSIS — E785 Hyperlipidemia, unspecified: Secondary | ICD-10-CM | POA: Diagnosis not present

## 2017-12-25 DIAGNOSIS — E669 Obesity, unspecified: Secondary | ICD-10-CM | POA: Insufficient documentation

## 2017-12-25 DIAGNOSIS — I10 Essential (primary) hypertension: Secondary | ICD-10-CM | POA: Diagnosis not present

## 2017-12-25 DIAGNOSIS — E8809 Other disorders of plasma-protein metabolism, not elsewhere classified: Secondary | ICD-10-CM | POA: Diagnosis not present

## 2017-12-25 DIAGNOSIS — Z6831 Body mass index (BMI) 31.0-31.9, adult: Secondary | ICD-10-CM | POA: Diagnosis not present

## 2017-12-25 DIAGNOSIS — L7682 Other postprocedural complications of skin and subcutaneous tissue: Secondary | ICD-10-CM | POA: Diagnosis not present

## 2017-12-25 DIAGNOSIS — E6609 Other obesity due to excess calories: Secondary | ICD-10-CM | POA: Diagnosis not present

## 2017-12-25 DIAGNOSIS — E1169 Type 2 diabetes mellitus with other specified complication: Secondary | ICD-10-CM | POA: Diagnosis present

## 2017-12-25 DIAGNOSIS — T8189XD Other complications of procedures, not elsewhere classified, subsequent encounter: Secondary | ICD-10-CM | POA: Diagnosis not present

## 2017-12-25 DIAGNOSIS — E119 Type 2 diabetes mellitus without complications: Secondary | ICD-10-CM | POA: Diagnosis not present

## 2017-12-25 MED ORDER — GLIMEPIRIDE 2 MG PO TABS: 2.0000 mg | ORAL_TABLET | Freq: Every day | ORAL | 0 refills | Status: DC

## 2017-12-25 NOTE — Progress Notes (Signed)
Subjective  Jacqueline Goodman is a 67 y.o. female is presenting with the following  DIABETES Disease Monitoring: Blood Sugar ranges(Severity) -around 120s she restarted on Glimepiride 2 mg about a month ago  Associated Symptoms- Polyuria/phagia/dipsia- no      Visual problems- no Medications: Compliance(Modifying factor) - glimiperide Hypoglycemic symptoms- no Timing - continuous  OBESITY Current weight/BMI : Body mass index is 31.84 kg/m.    How long have been obese:  years Course:  Lost with surgery and complications and slowly gaining now Problems or symptoms it causes:  Diabetes and leg pain   Things have tried to improve:  Walking more every day cutting out sweets    Monitoring Labs and Parameters Last A1C:  Lab Results  Component Value Date   HGBA1C 5.5 11/13/2017   Last Lipid:     Component Value Date/Time   CHOL 206 (H) 12/12/2016 1115   HDL 57 12/12/2016 1115   LDLDIRECT 127 (H) 06/05/2017 1113   Last Bmet  Potassium  Date Value Ref Range Status  09/13/2017 4.0 3.5 - 5.1 mmol/L Final   Sodium  Date Value Ref Range Status  09/13/2017 135 135 - 145 mmol/L Final  08/28/2017 136 134 - 144 mmol/L Final   Creat  Date Value Ref Range Status  06/27/2016 0.88 0.50 - 0.99 mg/dL Final    Comment:      For patients > or = 67 years of age: The upper reference limit for Creatinine is approximately 13% higher for people identified as African-American.      Creatinine, Ser  Date Value Ref Range Status  09/13/2017 0.58 0.44 - 1.00 mg/dL Final           Chief Complaint noted Review of Symptoms - see HPI PMH - Smoking status noted.    Objective Vital Signs reviewed BP 130/80 (BP Location: Right Arm, Patient Position: Sitting, Cuff Size: Normal)   Pulse 78   Temp 97.8 F (36.6 C) (Oral)   Wt 215 lb 9.6 oz (97.8 kg)   SpO2 98%   BMI 31.84 kg/m   Wound - healing, vac is off Psych:  Cognition and judgment appear intact. Alert, communicative  and cooperative  with normal attention span and concentration. No apparent delusions, illusions, hallucinations  Assessments/Plans  See after visit summary for details of patient instuctions  Diabetes mellitus type 2 in obese ([place]) Stable will hope to wean off all medications eventually   Obesity Discussed her plans

## 2017-12-25 NOTE — Telephone Encounter (Signed)
Davy Pique- RN with [facility] calling, states pt saw Psychiatric nurse and he agrees with wound care plan. Pt will need rx for an antifungal powder sent to pharmacy- [place] call back for questions [phone] Wallace Cullens, RN

## 2017-12-25 NOTE — Assessment & Plan Note (Addendum)
Discussed her plans

## 2017-12-25 NOTE — Assessment & Plan Note (Signed)
Stable will hope to wean off all medications eventually

## 2017-12-25 NOTE — Patient Instructions (Signed)
Good to see you today!  Thanks for coming in.  Bring the records for the colonoscopy   Come back in 2 months for A1c check  Keep doing what you are doing  Watch for signs of infection

## 2017-12-26 DIAGNOSIS — E785 Hyperlipidemia, unspecified: Secondary | ICD-10-CM | POA: Diagnosis not present

## 2017-12-26 DIAGNOSIS — L7682 Other postprocedural complications of skin and subcutaneous tissue: Secondary | ICD-10-CM | POA: Diagnosis not present

## 2017-12-26 DIAGNOSIS — I10 Essential (primary) hypertension: Secondary | ICD-10-CM | POA: Diagnosis not present

## 2017-12-26 DIAGNOSIS — E8809 Other disorders of plasma-protein metabolism, not elsewhere classified: Secondary | ICD-10-CM | POA: Diagnosis not present

## 2017-12-26 DIAGNOSIS — T8189XD Other complications of procedures, not elsewhere classified, subsequent encounter: Secondary | ICD-10-CM | POA: Diagnosis not present

## 2017-12-26 DIAGNOSIS — E119 Type 2 diabetes mellitus without complications: Secondary | ICD-10-CM | POA: Diagnosis not present

## 2017-12-26 MED ORDER — NYSTATIN 100000 UNIT/GM EX POWD: Freq: Four times a day (QID) | CUTANEOUS | 1 refills | Status: DC

## 2017-12-26 NOTE — Telephone Encounter (Signed)
Spoke to [place] as to inquire about the name of antifungal powder and she she said that it is up to Dr. Erin Hearing as to whatever he wants to RX.  Any type/brand is fine.Jacqueline Goodman, [place]

## 2017-12-26 NOTE — Telephone Encounter (Signed)
Done

## 2017-12-26 NOTE — Telephone Encounter (Signed)
Please inquire as to name of antifungal powder and I will Rx it   Thanks  [place]

## 2017-12-27 DIAGNOSIS — I10 Essential (primary) hypertension: Secondary | ICD-10-CM | POA: Diagnosis not present

## 2017-12-27 DIAGNOSIS — T8189XD Other complications of procedures, not elsewhere classified, subsequent encounter: Secondary | ICD-10-CM | POA: Diagnosis not present

## 2017-12-27 DIAGNOSIS — E785 Hyperlipidemia, unspecified: Secondary | ICD-10-CM | POA: Diagnosis not present

## 2017-12-27 DIAGNOSIS — L7682 Other postprocedural complications of skin and subcutaneous tissue: Secondary | ICD-10-CM | POA: Diagnosis not present

## 2017-12-27 DIAGNOSIS — E8809 Other disorders of plasma-protein metabolism, not elsewhere classified: Secondary | ICD-10-CM | POA: Diagnosis not present

## 2017-12-27 DIAGNOSIS — E119 Type 2 diabetes mellitus without complications: Secondary | ICD-10-CM | POA: Diagnosis not present

## 2017-12-30 DIAGNOSIS — I10 Essential (primary) hypertension: Secondary | ICD-10-CM | POA: Diagnosis not present

## 2017-12-30 DIAGNOSIS — L7682 Other postprocedural complications of skin and subcutaneous tissue: Secondary | ICD-10-CM | POA: Diagnosis not present

## 2017-12-30 DIAGNOSIS — E8809 Other disorders of plasma-protein metabolism, not elsewhere classified: Secondary | ICD-10-CM | POA: Diagnosis not present

## 2017-12-30 DIAGNOSIS — T8189XD Other complications of procedures, not elsewhere classified, subsequent encounter: Secondary | ICD-10-CM | POA: Diagnosis not present

## 2017-12-30 DIAGNOSIS — E785 Hyperlipidemia, unspecified: Secondary | ICD-10-CM | POA: Diagnosis not present

## 2017-12-30 DIAGNOSIS — E119 Type 2 diabetes mellitus without complications: Secondary | ICD-10-CM | POA: Diagnosis not present

## 2018-01-03 DIAGNOSIS — T8189XD Other complications of procedures, not elsewhere classified, subsequent encounter: Secondary | ICD-10-CM | POA: Diagnosis not present

## 2018-01-03 DIAGNOSIS — E785 Hyperlipidemia, unspecified: Secondary | ICD-10-CM | POA: Diagnosis not present

## 2018-01-03 DIAGNOSIS — E119 Type 2 diabetes mellitus without complications: Secondary | ICD-10-CM | POA: Diagnosis not present

## 2018-01-03 DIAGNOSIS — I10 Essential (primary) hypertension: Secondary | ICD-10-CM | POA: Diagnosis not present

## 2018-01-03 DIAGNOSIS — L7682 Other postprocedural complications of skin and subcutaneous tissue: Secondary | ICD-10-CM | POA: Diagnosis not present

## 2018-01-03 DIAGNOSIS — E8809 Other disorders of plasma-protein metabolism, not elsewhere classified: Secondary | ICD-10-CM | POA: Diagnosis not present

## 2018-01-06 DIAGNOSIS — L7682 Other postprocedural complications of skin and subcutaneous tissue: Secondary | ICD-10-CM | POA: Diagnosis not present

## 2018-01-06 DIAGNOSIS — E119 Type 2 diabetes mellitus without complications: Secondary | ICD-10-CM | POA: Diagnosis not present

## 2018-01-06 DIAGNOSIS — T8189XD Other complications of procedures, not elsewhere classified, subsequent encounter: Secondary | ICD-10-CM | POA: Diagnosis not present

## 2018-01-06 DIAGNOSIS — E8809 Other disorders of plasma-protein metabolism, not elsewhere classified: Secondary | ICD-10-CM | POA: Diagnosis not present

## 2018-01-06 DIAGNOSIS — E785 Hyperlipidemia, unspecified: Secondary | ICD-10-CM | POA: Diagnosis not present

## 2018-01-06 DIAGNOSIS — I10 Essential (primary) hypertension: Secondary | ICD-10-CM | POA: Diagnosis not present

## 2018-01-10 DIAGNOSIS — E785 Hyperlipidemia, unspecified: Secondary | ICD-10-CM | POA: Diagnosis not present

## 2018-01-10 DIAGNOSIS — E119 Type 2 diabetes mellitus without complications: Secondary | ICD-10-CM | POA: Diagnosis not present

## 2018-01-10 DIAGNOSIS — T8189XD Other complications of procedures, not elsewhere classified, subsequent encounter: Secondary | ICD-10-CM | POA: Diagnosis not present

## 2018-01-10 DIAGNOSIS — I10 Essential (primary) hypertension: Secondary | ICD-10-CM | POA: Diagnosis not present

## 2018-01-10 DIAGNOSIS — L7682 Other postprocedural complications of skin and subcutaneous tissue: Secondary | ICD-10-CM | POA: Diagnosis not present

## 2018-01-10 DIAGNOSIS — E8809 Other disorders of plasma-protein metabolism, not elsewhere classified: Secondary | ICD-10-CM | POA: Diagnosis not present

## 2018-01-13 DIAGNOSIS — E785 Hyperlipidemia, unspecified: Secondary | ICD-10-CM | POA: Diagnosis not present

## 2018-01-13 DIAGNOSIS — E8809 Other disorders of plasma-protein metabolism, not elsewhere classified: Secondary | ICD-10-CM | POA: Diagnosis not present

## 2018-01-13 DIAGNOSIS — T8189XD Other complications of procedures, not elsewhere classified, subsequent encounter: Secondary | ICD-10-CM | POA: Diagnosis not present

## 2018-01-13 DIAGNOSIS — E119 Type 2 diabetes mellitus without complications: Secondary | ICD-10-CM | POA: Diagnosis not present

## 2018-01-13 DIAGNOSIS — L7682 Other postprocedural complications of skin and subcutaneous tissue: Secondary | ICD-10-CM | POA: Diagnosis not present

## 2018-01-13 DIAGNOSIS — I10 Essential (primary) hypertension: Secondary | ICD-10-CM | POA: Diagnosis not present

## 2018-01-20 DIAGNOSIS — L7682 Other postprocedural complications of skin and subcutaneous tissue: Secondary | ICD-10-CM | POA: Diagnosis not present

## 2018-01-20 DIAGNOSIS — E785 Hyperlipidemia, unspecified: Secondary | ICD-10-CM | POA: Diagnosis not present

## 2018-01-20 DIAGNOSIS — T8189XD Other complications of procedures, not elsewhere classified, subsequent encounter: Secondary | ICD-10-CM | POA: Diagnosis not present

## 2018-01-20 DIAGNOSIS — I10 Essential (primary) hypertension: Secondary | ICD-10-CM | POA: Diagnosis not present

## 2018-01-20 DIAGNOSIS — E119 Type 2 diabetes mellitus without complications: Secondary | ICD-10-CM | POA: Diagnosis not present

## 2018-01-20 DIAGNOSIS — E8809 Other disorders of plasma-protein metabolism, not elsewhere classified: Secondary | ICD-10-CM | POA: Diagnosis not present

## 2018-01-23 ENCOUNTER — Telehealth: Payer: Self-pay

## 2018-01-23 NOTE — Telephone Encounter (Signed)
Faxed orders received from [place] for skilled nursing. Orders placed in PCP's box for signature. Please return to nurse clinic when complete.  Danley Danker, RN [facility] RN)

## 2018-01-24 NOTE — Telephone Encounter (Signed)
Signed order # [id number] faxed to [facility] at [phone] and placed in scan queue. Danley Danker, RN [facility] RN)

## 2018-01-27 ENCOUNTER — Other Ambulatory Visit: Payer: Self-pay | Admitting: Family Medicine

## 2018-01-27 DIAGNOSIS — E785 Hyperlipidemia, unspecified: Secondary | ICD-10-CM | POA: Diagnosis not present

## 2018-01-27 DIAGNOSIS — E8809 Other disorders of plasma-protein metabolism, not elsewhere classified: Secondary | ICD-10-CM | POA: Diagnosis not present

## 2018-01-27 DIAGNOSIS — L7682 Other postprocedural complications of skin and subcutaneous tissue: Secondary | ICD-10-CM | POA: Diagnosis not present

## 2018-01-27 DIAGNOSIS — T8189XD Other complications of procedures, not elsewhere classified, subsequent encounter: Secondary | ICD-10-CM | POA: Diagnosis not present

## 2018-01-27 DIAGNOSIS — I10 Essential (primary) hypertension: Secondary | ICD-10-CM | POA: Diagnosis not present

## 2018-01-27 DIAGNOSIS — E119 Type 2 diabetes mellitus without complications: Secondary | ICD-10-CM | POA: Diagnosis not present

## 2018-02-03 DIAGNOSIS — E119 Type 2 diabetes mellitus without complications: Secondary | ICD-10-CM | POA: Diagnosis not present

## 2018-02-03 DIAGNOSIS — L7682 Other postprocedural complications of skin and subcutaneous tissue: Secondary | ICD-10-CM | POA: Diagnosis not present

## 2018-02-03 DIAGNOSIS — E8809 Other disorders of plasma-protein metabolism, not elsewhere classified: Secondary | ICD-10-CM | POA: Diagnosis not present

## 2018-02-03 DIAGNOSIS — I10 Essential (primary) hypertension: Secondary | ICD-10-CM | POA: Diagnosis not present

## 2018-02-03 DIAGNOSIS — T8189XD Other complications of procedures, not elsewhere classified, subsequent encounter: Secondary | ICD-10-CM | POA: Diagnosis not present

## 2018-02-03 DIAGNOSIS — E785 Hyperlipidemia, unspecified: Secondary | ICD-10-CM | POA: Diagnosis not present

## 2018-02-12 ENCOUNTER — Other Ambulatory Visit: Payer: Self-pay

## 2018-02-12 ENCOUNTER — Ambulatory Visit (INDEPENDENT_AMBULATORY_CARE_PROVIDER_SITE_OTHER): Payer: Medicare Other | Admitting: Family Medicine

## 2018-02-12 ENCOUNTER — Encounter: Payer: Self-pay | Admitting: Family Medicine

## 2018-02-12 VITALS — BP 138/78 | HR 73 | Temp 98.3°F | Wt 223.0 lb

## 2018-02-12 DIAGNOSIS — I10 Essential (primary) hypertension: Secondary | ICD-10-CM | POA: Diagnosis not present

## 2018-02-12 DIAGNOSIS — T8149XA Infection following a procedure, other surgical site, initial encounter: Secondary | ICD-10-CM

## 2018-02-12 DIAGNOSIS — E1169 Type 2 diabetes mellitus with other specified complication: Secondary | ICD-10-CM

## 2018-02-12 DIAGNOSIS — E669 Obesity, unspecified: Secondary | ICD-10-CM

## 2018-02-12 LAB — POCT GLYCOSYLATED HEMOGLOBIN (HGB A1C): HbA1c, POC (controlled diabetic range): 5.7 % (ref 0.0–7.0)

## 2018-02-12 NOTE — Assessment & Plan Note (Signed)
Well controlled.  She does not want to taper medication yet until wound is completely healed

## 2018-02-12 NOTE — Patient Instructions (Addendum)
  If you blood pressure is > 140/90 regularly then contact me  Eat more veggies and nonprocessed food  Maintain your weight  See your eye doctor - ask them to send me report about the diabetes   You will get the records from [place] back in 3 months

## 2018-02-12 NOTE — Assessment & Plan Note (Signed)
BP Readings from Last 3 Encounters:  02/12/18 138/78  12/25/17 130/80  11/13/17 140/80   At goal

## 2018-02-12 NOTE — Assessment & Plan Note (Signed)
Improving.

## 2018-02-12 NOTE — Progress Notes (Signed)
Subjective  Jacqueline Goodman is a 67 y.o. female is presenting with the following  HYPERTENSION Disease Monitoring: Blood pressure range-usully well controlled when checked by [facility] nurese Chest pain, palpitations- no      Dyspnea- no Medications: Compliance- daily Lightheadedness,Syncope- no   Edema- no  DIABETES Disease Monitoring: Blood Sugar ranges-not checking Polyuria/phagia/dipsia- no      Visual problems- no Medications: Compliance- daily Hypoglycemic symptoms- no  CHRONIC WOUND Wound vac was stopped.  Feels is improving. No fever or discharge.  HH nurse will stop this week   Monitoring Labs and Parameters Last A1C:  Lab Results  Component Value Date   HGBA1C 5.7 02/12/2018    Last Lipid:     Component Value Date/Time   CHOL 206 (H) 12/12/2016 1115   HDL 57 12/12/2016 1115   LDLDIRECT 127 (H) 06/05/2017 1113    Last Bmet  Potassium  Date Value Ref Range Status  09/13/2017 4.0 3.5 - 5.1 mmol/L Final   Sodium  Date Value Ref Range Status  09/13/2017 135 135 - 145 mmol/L Final  08/28/2017 136 134 - 144 mmol/L Final   Creat  Date Value Ref Range Status  06/27/2016 0.88 0.50 - 0.99 mg/dL Final    Comment:      For patients > or = 67 years of age: The upper reference limit for Creatinine is approximately 13% higher for people identified as African-American.      Creatinine, Ser  Date Value Ref Range Status  09/13/2017 0.58 0.44 - 1.00 mg/dL Final      Last BPs:  BP Readings from Last 3 Encounters:  02/12/18 138/78  12/25/17 130/80  11/13/17 140/80       Chief Complaint noted Review of Symptoms - see HPI PMH - Smoking status noted.    Objective Vital Signs reviewed BP 138/78   Pulse 73   Temp 98.3 F (36.8 C) (Oral)   Wt 223 lb (101.2 kg)   SpO2 99%   BMI 32.93 kg/m   Assessments/Plans  See after visit summary for details of patient instuctions  Diabetes mellitus type 2 in obese ([place]) Well controlled.  She does not want to taper  medication yet until wound is completely healed   Essential hypertension BP Readings from Last 3 Encounters:  02/12/18 138/78  12/25/17 130/80  11/13/17 140/80   At goal   Wound infection after surgery Improving

## 2018-02-13 DIAGNOSIS — I10 Essential (primary) hypertension: Secondary | ICD-10-CM | POA: Diagnosis not present

## 2018-02-13 DIAGNOSIS — E8809 Other disorders of plasma-protein metabolism, not elsewhere classified: Secondary | ICD-10-CM | POA: Diagnosis not present

## 2018-02-13 DIAGNOSIS — L7682 Other postprocedural complications of skin and subcutaneous tissue: Secondary | ICD-10-CM | POA: Diagnosis not present

## 2018-02-13 DIAGNOSIS — E785 Hyperlipidemia, unspecified: Secondary | ICD-10-CM | POA: Diagnosis not present

## 2018-02-13 DIAGNOSIS — T8189XD Other complications of procedures, not elsewhere classified, subsequent encounter: Secondary | ICD-10-CM | POA: Diagnosis not present

## 2018-02-13 DIAGNOSIS — E119 Type 2 diabetes mellitus without complications: Secondary | ICD-10-CM | POA: Diagnosis not present

## 2018-02-20 ENCOUNTER — Encounter (INDEPENDENT_AMBULATORY_CARE_PROVIDER_SITE_OTHER): Payer: Medicare Other | Admitting: Ophthalmology

## 2018-02-20 DIAGNOSIS — I10 Essential (primary) hypertension: Secondary | ICD-10-CM | POA: Diagnosis not present

## 2018-02-20 DIAGNOSIS — H43813 Vitreous degeneration, bilateral: Secondary | ICD-10-CM | POA: Diagnosis not present

## 2018-02-20 DIAGNOSIS — H35033 Hypertensive retinopathy, bilateral: Secondary | ICD-10-CM

## 2018-02-20 DIAGNOSIS — E113313 Type 2 diabetes mellitus with moderate nonproliferative diabetic retinopathy with macular edema, bilateral: Secondary | ICD-10-CM | POA: Diagnosis not present

## 2018-02-20 DIAGNOSIS — D3132 Benign neoplasm of left choroid: Secondary | ICD-10-CM | POA: Diagnosis not present

## 2018-02-20 DIAGNOSIS — E11311 Type 2 diabetes mellitus with unspecified diabetic retinopathy with macular edema: Secondary | ICD-10-CM | POA: Diagnosis not present

## 2018-02-20 LAB — HM DIABETES EYE EXAM

## 2018-02-21 ENCOUNTER — Encounter: Payer: Self-pay | Admitting: Family Medicine

## 2018-03-25 ENCOUNTER — Other Ambulatory Visit: Payer: Self-pay | Admitting: Family Medicine

## 2018-04-02 ENCOUNTER — Telehealth: Payer: Self-pay

## 2018-04-02 DIAGNOSIS — T8149XA Infection following a procedure, other surgical site, initial encounter: Secondary | ICD-10-CM

## 2018-04-02 NOTE — Telephone Encounter (Signed)
Pts daughter called nurse line about pts "belly" wound. Pts daughter stated [facility] discharged pt about 1 month ago after wound care was completed. Pt daughter says they are running out of the cream that was given to them to rub on wound. Pts daughter stated they will run out before they can be seen by pcp. Please give them a call, as I was unsure which "cream" they were referring to.

## 2018-04-03 ENCOUNTER — Other Ambulatory Visit: Payer: Self-pay | Admitting: Family Medicine

## 2018-04-03 MED ORDER — SKINTEGRITY HYDROGEL EX GEL: CUTANEOUS | 2 refills | Status: DC

## 2018-04-03 NOTE — Telephone Encounter (Signed)
Covering for Dr. Erin Hearing. Called and spoke with patient and her daughter. The cream they needed a refill on is Skintegrity Hydrogel. I sent in a new rx. They also request that [facility] nursing be reordered as abdominal wound is not fully closed. I put in a [facility] referral for this. They were appreciative.  Leeanne Rio, MD

## 2018-04-03 NOTE — Addendum Note (Signed)
Addended by: Leeanne Rio on: 04/03/2018 09:44 AM   Modules accepted: Orders

## 2018-04-03 NOTE — Telephone Encounter (Addendum)
April at [facility] calling to request verbal order for Skintegrity Hydrogel.  Informed Rx was ordered yesterday, along with home health, by Dr. Ardelia Mems.  Ok to add 4x4 guaze pads and rolls.  [facility] is assessing patient and will call back if they need anything else.   Burna Forts, BSN, RN-BC

## 2018-04-08 DIAGNOSIS — E669 Obesity, unspecified: Secondary | ICD-10-CM | POA: Diagnosis not present

## 2018-04-08 DIAGNOSIS — E119 Type 2 diabetes mellitus without complications: Secondary | ICD-10-CM | POA: Diagnosis not present

## 2018-04-08 DIAGNOSIS — G8929 Other chronic pain: Secondary | ICD-10-CM | POA: Diagnosis not present

## 2018-04-08 DIAGNOSIS — I10 Essential (primary) hypertension: Secondary | ICD-10-CM | POA: Diagnosis not present

## 2018-04-08 DIAGNOSIS — T8149XD Infection following a procedure, other surgical site, subsequent encounter: Secondary | ICD-10-CM | POA: Diagnosis not present

## 2018-04-08 DIAGNOSIS — R2689 Other abnormalities of gait and mobility: Secondary | ICD-10-CM | POA: Diagnosis not present

## 2018-04-10 DIAGNOSIS — G8929 Other chronic pain: Secondary | ICD-10-CM | POA: Diagnosis not present

## 2018-04-10 DIAGNOSIS — I10 Essential (primary) hypertension: Secondary | ICD-10-CM | POA: Diagnosis not present

## 2018-04-10 DIAGNOSIS — T8149XD Infection following a procedure, other surgical site, subsequent encounter: Secondary | ICD-10-CM | POA: Diagnosis not present

## 2018-04-10 DIAGNOSIS — E669 Obesity, unspecified: Secondary | ICD-10-CM | POA: Diagnosis not present

## 2018-04-10 DIAGNOSIS — R2689 Other abnormalities of gait and mobility: Secondary | ICD-10-CM | POA: Diagnosis not present

## 2018-04-10 DIAGNOSIS — E119 Type 2 diabetes mellitus without complications: Secondary | ICD-10-CM | POA: Diagnosis not present

## 2018-04-13 ENCOUNTER — Other Ambulatory Visit: Payer: Self-pay | Admitting: Family Medicine

## 2018-04-15 DIAGNOSIS — E119 Type 2 diabetes mellitus without complications: Secondary | ICD-10-CM | POA: Diagnosis not present

## 2018-04-15 DIAGNOSIS — R2689 Other abnormalities of gait and mobility: Secondary | ICD-10-CM | POA: Diagnosis not present

## 2018-04-15 DIAGNOSIS — T8149XD Infection following a procedure, other surgical site, subsequent encounter: Secondary | ICD-10-CM | POA: Diagnosis not present

## 2018-04-15 DIAGNOSIS — E669 Obesity, unspecified: Secondary | ICD-10-CM | POA: Diagnosis not present

## 2018-04-15 DIAGNOSIS — G8929 Other chronic pain: Secondary | ICD-10-CM | POA: Diagnosis not present

## 2018-04-15 DIAGNOSIS — I10 Essential (primary) hypertension: Secondary | ICD-10-CM | POA: Diagnosis not present

## 2018-04-17 DIAGNOSIS — R2689 Other abnormalities of gait and mobility: Secondary | ICD-10-CM | POA: Diagnosis not present

## 2018-04-17 DIAGNOSIS — E119 Type 2 diabetes mellitus without complications: Secondary | ICD-10-CM | POA: Diagnosis not present

## 2018-04-17 DIAGNOSIS — E669 Obesity, unspecified: Secondary | ICD-10-CM | POA: Diagnosis not present

## 2018-04-17 DIAGNOSIS — I10 Essential (primary) hypertension: Secondary | ICD-10-CM | POA: Diagnosis not present

## 2018-04-17 DIAGNOSIS — G8929 Other chronic pain: Secondary | ICD-10-CM | POA: Diagnosis not present

## 2018-04-17 DIAGNOSIS — T8149XD Infection following a procedure, other surgical site, subsequent encounter: Secondary | ICD-10-CM | POA: Diagnosis not present

## 2018-04-18 DIAGNOSIS — R2689 Other abnormalities of gait and mobility: Secondary | ICD-10-CM | POA: Diagnosis not present

## 2018-04-18 DIAGNOSIS — G8929 Other chronic pain: Secondary | ICD-10-CM | POA: Diagnosis not present

## 2018-04-18 DIAGNOSIS — E669 Obesity, unspecified: Secondary | ICD-10-CM | POA: Diagnosis not present

## 2018-04-18 DIAGNOSIS — T8149XD Infection following a procedure, other surgical site, subsequent encounter: Secondary | ICD-10-CM | POA: Diagnosis not present

## 2018-04-18 DIAGNOSIS — I10 Essential (primary) hypertension: Secondary | ICD-10-CM | POA: Diagnosis not present

## 2018-04-18 DIAGNOSIS — E119 Type 2 diabetes mellitus without complications: Secondary | ICD-10-CM | POA: Diagnosis not present

## 2018-04-21 DIAGNOSIS — E669 Obesity, unspecified: Secondary | ICD-10-CM | POA: Diagnosis not present

## 2018-04-21 DIAGNOSIS — T8149XD Infection following a procedure, other surgical site, subsequent encounter: Secondary | ICD-10-CM | POA: Diagnosis not present

## 2018-04-21 DIAGNOSIS — G8929 Other chronic pain: Secondary | ICD-10-CM | POA: Diagnosis not present

## 2018-04-21 DIAGNOSIS — I10 Essential (primary) hypertension: Secondary | ICD-10-CM | POA: Diagnosis not present

## 2018-04-21 DIAGNOSIS — E119 Type 2 diabetes mellitus without complications: Secondary | ICD-10-CM | POA: Diagnosis not present

## 2018-04-21 DIAGNOSIS — R2689 Other abnormalities of gait and mobility: Secondary | ICD-10-CM | POA: Diagnosis not present

## 2018-04-22 DIAGNOSIS — T8149XD Infection following a procedure, other surgical site, subsequent encounter: Secondary | ICD-10-CM | POA: Diagnosis not present

## 2018-04-22 DIAGNOSIS — R2689 Other abnormalities of gait and mobility: Secondary | ICD-10-CM | POA: Diagnosis not present

## 2018-04-22 DIAGNOSIS — G8929 Other chronic pain: Secondary | ICD-10-CM | POA: Diagnosis not present

## 2018-04-22 DIAGNOSIS — I10 Essential (primary) hypertension: Secondary | ICD-10-CM | POA: Diagnosis not present

## 2018-04-22 DIAGNOSIS — E119 Type 2 diabetes mellitus without complications: Secondary | ICD-10-CM | POA: Diagnosis not present

## 2018-04-22 DIAGNOSIS — E669 Obesity, unspecified: Secondary | ICD-10-CM | POA: Diagnosis not present

## 2018-04-24 DIAGNOSIS — R2689 Other abnormalities of gait and mobility: Secondary | ICD-10-CM | POA: Diagnosis not present

## 2018-04-24 DIAGNOSIS — E119 Type 2 diabetes mellitus without complications: Secondary | ICD-10-CM | POA: Diagnosis not present

## 2018-04-24 DIAGNOSIS — E669 Obesity, unspecified: Secondary | ICD-10-CM | POA: Diagnosis not present

## 2018-04-24 DIAGNOSIS — G8929 Other chronic pain: Secondary | ICD-10-CM | POA: Diagnosis not present

## 2018-04-24 DIAGNOSIS — T8149XD Infection following a procedure, other surgical site, subsequent encounter: Secondary | ICD-10-CM | POA: Diagnosis not present

## 2018-04-24 DIAGNOSIS — I10 Essential (primary) hypertension: Secondary | ICD-10-CM | POA: Diagnosis not present

## 2018-04-25 DIAGNOSIS — E669 Obesity, unspecified: Secondary | ICD-10-CM | POA: Diagnosis not present

## 2018-04-25 DIAGNOSIS — T8149XD Infection following a procedure, other surgical site, subsequent encounter: Secondary | ICD-10-CM | POA: Diagnosis not present

## 2018-04-25 DIAGNOSIS — G8929 Other chronic pain: Secondary | ICD-10-CM | POA: Diagnosis not present

## 2018-04-25 DIAGNOSIS — R2689 Other abnormalities of gait and mobility: Secondary | ICD-10-CM | POA: Diagnosis not present

## 2018-04-25 DIAGNOSIS — I10 Essential (primary) hypertension: Secondary | ICD-10-CM | POA: Diagnosis not present

## 2018-04-25 DIAGNOSIS — E119 Type 2 diabetes mellitus without complications: Secondary | ICD-10-CM | POA: Diagnosis not present

## 2018-04-27 ENCOUNTER — Other Ambulatory Visit: Payer: Self-pay | Admitting: Family Medicine

## 2018-04-29 DIAGNOSIS — T8149XD Infection following a procedure, other surgical site, subsequent encounter: Secondary | ICD-10-CM | POA: Diagnosis not present

## 2018-04-29 DIAGNOSIS — R2689 Other abnormalities of gait and mobility: Secondary | ICD-10-CM | POA: Diagnosis not present

## 2018-04-29 DIAGNOSIS — G8929 Other chronic pain: Secondary | ICD-10-CM | POA: Diagnosis not present

## 2018-04-29 DIAGNOSIS — I10 Essential (primary) hypertension: Secondary | ICD-10-CM | POA: Diagnosis not present

## 2018-04-29 DIAGNOSIS — E119 Type 2 diabetes mellitus without complications: Secondary | ICD-10-CM | POA: Diagnosis not present

## 2018-04-29 DIAGNOSIS — E669 Obesity, unspecified: Secondary | ICD-10-CM | POA: Diagnosis not present

## 2018-05-01 DIAGNOSIS — T8149XD Infection following a procedure, other surgical site, subsequent encounter: Secondary | ICD-10-CM | POA: Diagnosis not present

## 2018-05-01 DIAGNOSIS — E669 Obesity, unspecified: Secondary | ICD-10-CM | POA: Diagnosis not present

## 2018-05-01 DIAGNOSIS — R2689 Other abnormalities of gait and mobility: Secondary | ICD-10-CM | POA: Diagnosis not present

## 2018-05-01 DIAGNOSIS — I10 Essential (primary) hypertension: Secondary | ICD-10-CM | POA: Diagnosis not present

## 2018-05-01 DIAGNOSIS — G8929 Other chronic pain: Secondary | ICD-10-CM | POA: Diagnosis not present

## 2018-05-01 DIAGNOSIS — E119 Type 2 diabetes mellitus without complications: Secondary | ICD-10-CM | POA: Diagnosis not present

## 2018-05-05 DIAGNOSIS — G8929 Other chronic pain: Secondary | ICD-10-CM | POA: Diagnosis not present

## 2018-05-05 DIAGNOSIS — T8149XD Infection following a procedure, other surgical site, subsequent encounter: Secondary | ICD-10-CM | POA: Diagnosis not present

## 2018-05-05 DIAGNOSIS — E669 Obesity, unspecified: Secondary | ICD-10-CM | POA: Diagnosis not present

## 2018-05-05 DIAGNOSIS — E119 Type 2 diabetes mellitus without complications: Secondary | ICD-10-CM | POA: Diagnosis not present

## 2018-05-05 DIAGNOSIS — I10 Essential (primary) hypertension: Secondary | ICD-10-CM | POA: Diagnosis not present

## 2018-05-05 DIAGNOSIS — R2689 Other abnormalities of gait and mobility: Secondary | ICD-10-CM | POA: Diagnosis not present

## 2018-05-06 DIAGNOSIS — E669 Obesity, unspecified: Secondary | ICD-10-CM | POA: Diagnosis not present

## 2018-05-06 DIAGNOSIS — T8149XD Infection following a procedure, other surgical site, subsequent encounter: Secondary | ICD-10-CM | POA: Diagnosis not present

## 2018-05-06 DIAGNOSIS — G8929 Other chronic pain: Secondary | ICD-10-CM | POA: Diagnosis not present

## 2018-05-06 DIAGNOSIS — I10 Essential (primary) hypertension: Secondary | ICD-10-CM | POA: Diagnosis not present

## 2018-05-06 DIAGNOSIS — R2689 Other abnormalities of gait and mobility: Secondary | ICD-10-CM | POA: Diagnosis not present

## 2018-05-06 DIAGNOSIS — E119 Type 2 diabetes mellitus without complications: Secondary | ICD-10-CM | POA: Diagnosis not present

## 2018-05-07 DIAGNOSIS — G8929 Other chronic pain: Secondary | ICD-10-CM | POA: Diagnosis not present

## 2018-05-07 DIAGNOSIS — E669 Obesity, unspecified: Secondary | ICD-10-CM | POA: Diagnosis not present

## 2018-05-07 DIAGNOSIS — T8149XD Infection following a procedure, other surgical site, subsequent encounter: Secondary | ICD-10-CM | POA: Diagnosis not present

## 2018-05-07 DIAGNOSIS — I10 Essential (primary) hypertension: Secondary | ICD-10-CM | POA: Diagnosis not present

## 2018-05-07 DIAGNOSIS — R2689 Other abnormalities of gait and mobility: Secondary | ICD-10-CM | POA: Diagnosis not present

## 2018-05-07 DIAGNOSIS — E119 Type 2 diabetes mellitus without complications: Secondary | ICD-10-CM | POA: Diagnosis not present

## 2018-05-08 ENCOUNTER — Encounter (INDEPENDENT_AMBULATORY_CARE_PROVIDER_SITE_OTHER): Payer: Medicare Other | Admitting: Ophthalmology

## 2018-05-08 DIAGNOSIS — E11311 Type 2 diabetes mellitus with unspecified diabetic retinopathy with macular edema: Secondary | ICD-10-CM

## 2018-05-08 DIAGNOSIS — I10 Essential (primary) hypertension: Secondary | ICD-10-CM

## 2018-05-08 DIAGNOSIS — E113313 Type 2 diabetes mellitus with moderate nonproliferative diabetic retinopathy with macular edema, bilateral: Secondary | ICD-10-CM | POA: Diagnosis not present

## 2018-05-08 DIAGNOSIS — H43813 Vitreous degeneration, bilateral: Secondary | ICD-10-CM

## 2018-05-08 DIAGNOSIS — H35033 Hypertensive retinopathy, bilateral: Secondary | ICD-10-CM | POA: Diagnosis not present

## 2018-05-08 DIAGNOSIS — D3132 Benign neoplasm of left choroid: Secondary | ICD-10-CM | POA: Diagnosis not present

## 2018-05-09 ENCOUNTER — Encounter (INDEPENDENT_AMBULATORY_CARE_PROVIDER_SITE_OTHER): Payer: Medicare Other | Admitting: Ophthalmology

## 2018-05-13 DIAGNOSIS — E119 Type 2 diabetes mellitus without complications: Secondary | ICD-10-CM | POA: Diagnosis not present

## 2018-05-13 DIAGNOSIS — T8149XD Infection following a procedure, other surgical site, subsequent encounter: Secondary | ICD-10-CM | POA: Diagnosis not present

## 2018-05-13 DIAGNOSIS — R2689 Other abnormalities of gait and mobility: Secondary | ICD-10-CM | POA: Diagnosis not present

## 2018-05-13 DIAGNOSIS — E669 Obesity, unspecified: Secondary | ICD-10-CM | POA: Diagnosis not present

## 2018-05-13 DIAGNOSIS — G8929 Other chronic pain: Secondary | ICD-10-CM | POA: Diagnosis not present

## 2018-05-13 DIAGNOSIS — I10 Essential (primary) hypertension: Secondary | ICD-10-CM | POA: Diagnosis not present

## 2018-05-20 ENCOUNTER — Ambulatory Visit (INDEPENDENT_AMBULATORY_CARE_PROVIDER_SITE_OTHER): Payer: Medicare Other | Admitting: Family Medicine

## 2018-05-20 ENCOUNTER — Encounter: Payer: Self-pay | Admitting: Family Medicine

## 2018-05-20 ENCOUNTER — Other Ambulatory Visit: Payer: Self-pay

## 2018-05-20 VITALS — BP 170/88 | HR 79 | Temp 98.3°F | Ht 69.0 in | Wt 235.8 lb

## 2018-05-20 DIAGNOSIS — E1169 Type 2 diabetes mellitus with other specified complication: Secondary | ICD-10-CM | POA: Diagnosis not present

## 2018-05-20 DIAGNOSIS — I10 Essential (primary) hypertension: Secondary | ICD-10-CM | POA: Diagnosis not present

## 2018-05-20 DIAGNOSIS — E669 Obesity, unspecified: Secondary | ICD-10-CM | POA: Diagnosis not present

## 2018-05-20 DIAGNOSIS — E7849 Other hyperlipidemia: Secondary | ICD-10-CM

## 2018-05-20 DIAGNOSIS — D369 Benign neoplasm, unspecified site: Secondary | ICD-10-CM | POA: Diagnosis not present

## 2018-05-20 LAB — POCT GLYCOSYLATED HEMOGLOBIN (HGB A1C): HbA1c, POC (controlled diabetic range): 6.2 % (ref 0.0–7.0)

## 2018-05-20 NOTE — Assessment & Plan Note (Signed)
Sent for records from Dr Percell Miller

## 2018-05-20 NOTE — Assessment & Plan Note (Signed)
Likely will need statin.  She is not happy about new medications.  See after visit summary

## 2018-05-20 NOTE — Assessment & Plan Note (Signed)
Well controlled.  May try to slowly get her on another medication but she is leery of change .  Will address lipids first

## 2018-05-20 NOTE — Patient Instructions (Addendum)
Good to see you today!  Thanks for coming in.  For the abdomen  Vaseline twice a day on the dry areas   For the weight and blood pressure   Walk - gradually build back up to 30 minutes every day   Send me your blood pressure readings  If your LDL cholesterol is > 100 we will need to start lipitor  Come back in 3 months

## 2018-05-20 NOTE — Assessment & Plan Note (Signed)
Not a goal here but home readings seem good by report.  Hopefully walking and weight control will help

## 2018-05-20 NOTE — Progress Notes (Signed)
Subjective  Jacqueline Goodman is a 67 y.o. female is presenting with the following  HYPERTENSION Disease Monitoring: Blood pressure range-at home often in the 100s/70s as measured by home health nurses Chest pain, palpitations- no      Dyspnea- no Medications: Compliance- losartan daily Lightheadedness,Syncope- no   Edema- no  DIABETES Disease Monitoring: Blood Sugar ranges-not checking Polyuria/phagia/dipsia- no      Visual problems- o Medications: Compliance- daily glimiperide  Hypoglycemic symptoms- no  HYPERLIPIDEMIA Disease Monitoring: See symptoms for Hypertension Medications: Compliance- no medications  Right upper quadrant pain- no  Muscle aches- no  Monitoring Labs and Parameters Last A1C:  Lab Results  Component Value Date   HGBA1C 6.2 05/20/2018    Last Lipid:     Component Value Date/Time   CHOL 206 (H) 12/12/2016 1115   HDL 57 12/12/2016 1115   LDLDIRECT 127 (H) 06/05/2017 1113    Last Bmet  Potassium  Date Value Ref Range Status  09/13/2017 4.0 3.5 - 5.1 mmol/L Final   Sodium  Date Value Ref Range Status  09/13/2017 135 135 - 145 mmol/L Final  08/28/2017 136 134 - 144 mmol/L Final   Creat  Date Value Ref Range Status  06/27/2016 0.88 0.50 - 0.99 mg/dL Final    Comment:      For patients > or = 67 years of age: The upper reference limit for Creatinine is approximately 13% higher for people identified as African-American.      Creatinine, Ser  Date Value Ref Range Status  09/13/2017 0.58 0.44 - 1.00 mg/dL Final      Last BPs:  BP Readings from Last 3 Encounters:  05/20/18 (!) 170/88  02/12/18 138/78  12/25/17 130/80       Chief Complaint noted Review of Symptoms - see HPI PMH - Smoking status noted.    Objective Vital Signs reviewed BP (!) 170/88   Pulse 79   Temp 98.3 F (36.8 C) (Oral)   Ht 5\' 9"  (1.753 m)   Wt 235 lb 12.8 oz (107 kg)   SpO2 99%   BMI 34.82 kg/m   Heart - Regular rate and rhythm.  No murmurs, gallops  or rubs.    Lungs:  Normal respiratory effort, chest expands symmetrically. Lungs are clear to auscultation, no crackles or wheezes. Extremities:  No cyanosis, edema, or deformity noted with good range of motion of all major joints.    Assessments/Plans  See after visit summary for details of patient instuctions  Diabetes mellitus type 2 in obese [facility]) Well controlled.  May try to slowly get her on another medication but she is leery of change .  Will address lipids first   Essential hypertension Not a goal here but home readings seem good by report.  Hopefully walking and weight control will help  Hyperlipidemia Likely will need statin.  She is not happy about new medications.  See after visit summary   Tubulovillous adenoma Sent for records from Dr Percell Miller

## 2018-05-21 ENCOUNTER — Other Ambulatory Visit: Payer: Self-pay | Admitting: Family Medicine

## 2018-05-21 ENCOUNTER — Encounter: Payer: Self-pay | Admitting: Family Medicine

## 2018-05-21 DIAGNOSIS — I10 Essential (primary) hypertension: Secondary | ICD-10-CM | POA: Diagnosis not present

## 2018-05-21 DIAGNOSIS — E119 Type 2 diabetes mellitus without complications: Secondary | ICD-10-CM | POA: Diagnosis not present

## 2018-05-21 DIAGNOSIS — R2689 Other abnormalities of gait and mobility: Secondary | ICD-10-CM | POA: Diagnosis not present

## 2018-05-21 DIAGNOSIS — E669 Obesity, unspecified: Secondary | ICD-10-CM | POA: Diagnosis not present

## 2018-05-21 DIAGNOSIS — G8929 Other chronic pain: Secondary | ICD-10-CM | POA: Diagnosis not present

## 2018-05-21 DIAGNOSIS — T8149XD Infection following a procedure, other surgical site, subsequent encounter: Secondary | ICD-10-CM | POA: Diagnosis not present

## 2018-05-21 LAB — LDL CHOLESTEROL, DIRECT: LDL Direct: 147 mg/dL — ABNORMAL HIGH (ref 0–99)

## 2018-05-21 MED ORDER — ATORVASTATIN CALCIUM 40 MG PO TABS: 40.0000 mg | ORAL_TABLET | Freq: Every day | ORAL | 3 refills | Status: DC

## 2018-05-28 DIAGNOSIS — G8929 Other chronic pain: Secondary | ICD-10-CM | POA: Diagnosis not present

## 2018-05-28 DIAGNOSIS — E669 Obesity, unspecified: Secondary | ICD-10-CM | POA: Diagnosis not present

## 2018-05-28 DIAGNOSIS — R2689 Other abnormalities of gait and mobility: Secondary | ICD-10-CM | POA: Diagnosis not present

## 2018-05-28 DIAGNOSIS — E119 Type 2 diabetes mellitus without complications: Secondary | ICD-10-CM | POA: Diagnosis not present

## 2018-05-28 DIAGNOSIS — I10 Essential (primary) hypertension: Secondary | ICD-10-CM | POA: Diagnosis not present

## 2018-05-28 DIAGNOSIS — T8149XD Infection following a procedure, other surgical site, subsequent encounter: Secondary | ICD-10-CM | POA: Diagnosis not present

## 2018-07-08 DIAGNOSIS — D3132 Benign neoplasm of left choroid: Secondary | ICD-10-CM | POA: Diagnosis not present

## 2018-07-08 DIAGNOSIS — E113213 Type 2 diabetes mellitus with mild nonproliferative diabetic retinopathy with macular edema, bilateral: Secondary | ICD-10-CM | POA: Diagnosis not present

## 2018-07-08 DIAGNOSIS — H26493 Other secondary cataract, bilateral: Secondary | ICD-10-CM | POA: Diagnosis not present

## 2018-07-08 DIAGNOSIS — Z961 Presence of intraocular lens: Secondary | ICD-10-CM | POA: Diagnosis not present

## 2018-07-28 ENCOUNTER — Other Ambulatory Visit: Payer: Self-pay | Admitting: Family Medicine

## 2018-07-31 ENCOUNTER — Encounter (INDEPENDENT_AMBULATORY_CARE_PROVIDER_SITE_OTHER): Payer: Medicare Other | Admitting: Ophthalmology

## 2018-07-31 DIAGNOSIS — E11311 Type 2 diabetes mellitus with unspecified diabetic retinopathy with macular edema: Secondary | ICD-10-CM

## 2018-07-31 DIAGNOSIS — H43813 Vitreous degeneration, bilateral: Secondary | ICD-10-CM | POA: Diagnosis not present

## 2018-07-31 DIAGNOSIS — E113313 Type 2 diabetes mellitus with moderate nonproliferative diabetic retinopathy with macular edema, bilateral: Secondary | ICD-10-CM | POA: Diagnosis not present

## 2018-07-31 DIAGNOSIS — I10 Essential (primary) hypertension: Secondary | ICD-10-CM | POA: Diagnosis not present

## 2018-07-31 DIAGNOSIS — H35033 Hypertensive retinopathy, bilateral: Secondary | ICD-10-CM

## 2018-07-31 DIAGNOSIS — D3132 Benign neoplasm of left choroid: Secondary | ICD-10-CM

## 2018-08-13 ENCOUNTER — Ambulatory Visit (INDEPENDENT_AMBULATORY_CARE_PROVIDER_SITE_OTHER): Payer: Medicare Other | Admitting: Family Medicine

## 2018-08-13 DIAGNOSIS — E1169 Type 2 diabetes mellitus with other specified complication: Secondary | ICD-10-CM | POA: Diagnosis not present

## 2018-08-13 DIAGNOSIS — I10 Essential (primary) hypertension: Secondary | ICD-10-CM | POA: Diagnosis not present

## 2018-08-13 DIAGNOSIS — E669 Obesity, unspecified: Secondary | ICD-10-CM

## 2018-08-13 LAB — POCT GLYCOSYLATED HEMOGLOBIN (HGB A1C): HbA1c, POC (controlled diabetic range): 7 % (ref 0.0–7.0)

## 2018-08-13 NOTE — Assessment & Plan Note (Signed)
Lab Results  Component Value Date   HGBA1C 7.0 08/13/2018   At goal continue current medications

## 2018-08-13 NOTE — Assessment & Plan Note (Signed)
BP Readings from Last 3 Encounters:  05/20/18 (!) 170/88  02/12/18 138/78  12/25/17 130/80   Patient was upset today so readings are not accurate.  Continue current medications and recheck in 2 months

## 2018-08-13 NOTE — Progress Notes (Signed)
Subjective  Jacqueline Goodman is a 67 y.o. female is presenting with the following  DIABETES Disease Monitoring: Blood Sugar ranges(Severity) -not checking  Associated Symptoms- Polyuria/phagia/dipsia- no      Visual problems- no Medications: Compliance(Modifying factor) - daily amaryl Hypoglycemic symptoms- no Timing - continuous  HYPERTENSION Disease Monitoring  Home BP Monitoring (Severity) not checking Symptoms - Chest pain- no    Dyspnea- no Medications (Modifying factors) Compliance-  Daily losartan. Lightheadedness-  no  Edema- no Timing - continuous  Duration - years ROS - See HPI Patient's idea of her appointment time and ours did not coincide and she was very upset   PMH Lab Review   Potassium  Date Value Ref Range Status  09/13/2017 4.0 3.5 - 5.1 mmol/L Final   Sodium  Date Value Ref Range Status  09/13/2017 135 135 - 145 mmol/L Final  08/28/2017 136 134 - 144 mmol/L Final   Creat  Date Value Ref Range Status  06/27/2016 0.88 0.50 - 0.99 mg/dL Final    Comment:      For patients > or = 67 years of age: The upper reference limit for Creatinine is approximately 13% higher for people identified as African-American.      Creatinine, Ser  Date Value Ref Range Status  09/13/2017 0.58 0.44 - 1.00 mg/dL Final          Monitoring Labs and Parameters Last A1C:  Lab Results  Component Value Date   HGBA1C 7.0 08/13/2018   Last Lipid:     Component Value Date/Time   CHOL 206 (H) 12/12/2016 1115   HDL 57 12/12/2016 1115   LDLDIRECT 147 (H) 05/20/2018 1118   Last Bmet  Potassium  Date Value Ref Range Status  09/13/2017 4.0 3.5 - 5.1 mmol/L Final   Sodium  Date Value Ref Range Status  09/13/2017 135 135 - 145 mmol/L Final  08/28/2017 136 134 - 144 mmol/L Final   Creat  Date Value Ref Range Status  06/27/2016 0.88 0.50 - 0.99 mg/dL Final    Comment:      For patients > or = 67 years of age: The upper reference limit for Creatinine is approximately  13% higher for people identified as African-American.      Creatinine, Ser  Date Value Ref Range Status  09/13/2017 0.58 0.44 - 1.00 mg/dL Final          Chief Complaint noted Review of Symptoms - see HPI PMH - Smoking status noted.    Objective Vital Signs reviewed There were no vitals taken for this visit. Psych:  Cognition and judgment appear intact. Alert, communicative  and cooperative with normal attention span and concentration. No apparent delusions, illusions, hallucinations  Assessments/Plans  See after visit summary for details of patient instuctions  Diabetes mellitus type 2 in obese [facility]) Lab Results  Component Value Date   HGBA1C 7.0 08/13/2018   At goal continue current medications   Essential hypertension BP Readings from Last 3 Encounters:  05/20/18 (!) 170/88  02/12/18 138/78  12/25/17 130/80   Patient was upset today so readings are not accurate.  Continue current medications and recheck in 2 months

## 2018-10-09 ENCOUNTER — Encounter (INDEPENDENT_AMBULATORY_CARE_PROVIDER_SITE_OTHER): Payer: Medicare Other | Admitting: Ophthalmology

## 2018-10-09 DIAGNOSIS — E10311 Type 1 diabetes mellitus with unspecified diabetic retinopathy with macular edema: Secondary | ICD-10-CM | POA: Diagnosis not present

## 2018-10-09 DIAGNOSIS — I10 Essential (primary) hypertension: Secondary | ICD-10-CM

## 2018-10-09 DIAGNOSIS — E103313 Type 1 diabetes mellitus with moderate nonproliferative diabetic retinopathy with macular edema, bilateral: Secondary | ICD-10-CM

## 2018-10-09 DIAGNOSIS — H43813 Vitreous degeneration, bilateral: Secondary | ICD-10-CM | POA: Diagnosis not present

## 2018-10-09 DIAGNOSIS — H35033 Hypertensive retinopathy, bilateral: Secondary | ICD-10-CM | POA: Diagnosis not present

## 2018-10-09 DIAGNOSIS — D3132 Benign neoplasm of left choroid: Secondary | ICD-10-CM | POA: Diagnosis not present

## 2018-10-09 LAB — HM DIABETES EYE EXAM

## 2018-11-04 DIAGNOSIS — Z961 Presence of intraocular lens: Secondary | ICD-10-CM | POA: Diagnosis not present

## 2018-11-04 DIAGNOSIS — H26492 Other secondary cataract, left eye: Secondary | ICD-10-CM | POA: Diagnosis not present

## 2018-11-06 ENCOUNTER — Encounter: Payer: Self-pay | Admitting: Family Medicine

## 2018-11-23 ENCOUNTER — Other Ambulatory Visit: Payer: Self-pay | Admitting: Family Medicine

## 2018-11-28 ENCOUNTER — Telehealth: Payer: Self-pay | Admitting: Family Medicine

## 2018-11-28 NOTE — Telephone Encounter (Signed)
All are well  Discussed safety and distnacing and to call if need anything  Come for A1c in later May

## 2018-12-18 ENCOUNTER — Other Ambulatory Visit: Payer: Self-pay

## 2018-12-18 ENCOUNTER — Encounter (INDEPENDENT_AMBULATORY_CARE_PROVIDER_SITE_OTHER): Payer: Medicare Other | Admitting: Ophthalmology

## 2018-12-18 DIAGNOSIS — H35033 Hypertensive retinopathy, bilateral: Secondary | ICD-10-CM

## 2018-12-18 DIAGNOSIS — H43813 Vitreous degeneration, bilateral: Secondary | ICD-10-CM | POA: Diagnosis not present

## 2018-12-18 DIAGNOSIS — E113313 Type 2 diabetes mellitus with moderate nonproliferative diabetic retinopathy with macular edema, bilateral: Secondary | ICD-10-CM

## 2018-12-18 DIAGNOSIS — D3132 Benign neoplasm of left choroid: Secondary | ICD-10-CM | POA: Diagnosis not present

## 2018-12-18 DIAGNOSIS — E11311 Type 2 diabetes mellitus with unspecified diabetic retinopathy with macular edema: Secondary | ICD-10-CM | POA: Diagnosis not present

## 2018-12-18 DIAGNOSIS — I10 Essential (primary) hypertension: Secondary | ICD-10-CM

## 2019-02-15 ENCOUNTER — Other Ambulatory Visit: Payer: Self-pay | Admitting: Family Medicine

## 2019-02-17 ENCOUNTER — Telehealth (INDEPENDENT_AMBULATORY_CARE_PROVIDER_SITE_OTHER): Payer: Medicare Other | Admitting: Family Medicine

## 2019-02-17 ENCOUNTER — Other Ambulatory Visit: Payer: Self-pay

## 2019-02-17 ENCOUNTER — Telehealth: Payer: Self-pay | Admitting: Internal Medicine

## 2019-02-17 DIAGNOSIS — E7849 Other hyperlipidemia: Secondary | ICD-10-CM

## 2019-02-17 DIAGNOSIS — I10 Essential (primary) hypertension: Secondary | ICD-10-CM

## 2019-02-17 DIAGNOSIS — D369 Benign neoplasm, unspecified site: Secondary | ICD-10-CM

## 2019-02-17 DIAGNOSIS — E1169 Type 2 diabetes mellitus with other specified complication: Secondary | ICD-10-CM

## 2019-02-17 DIAGNOSIS — E669 Obesity, unspecified: Secondary | ICD-10-CM | POA: Diagnosis not present

## 2019-02-17 NOTE — Assessment & Plan Note (Signed)
Prefers a GI in [place].  Will refer

## 2019-02-17 NOTE — Assessment & Plan Note (Signed)
Sounds at goal.  Is adherent.

## 2019-02-17 NOTE — Progress Notes (Signed)
[place] Telemedicine Visit  Patient consented to have virtual visit. Method of visit: Telephone  Encounter participants: Patient: Jacqueline Goodman - located at home Provider: Lind Covert - located at office Others (if applicable): no  Chief Complaint: check up  HPI:  Overall feels well.  Is getting some walking in both inside and out.  Has not been checking her weight  HYPERTENSION Disease Monitoring: Blood pressure range-not checking Chest pain, palpitations- no      Dyspnea- no Medications: Compliance- daily losartan Lightheadedness,Syncope- no   Edema- no  DIABETES Disease Monitoring: Blood Sugar ranges-120-200 Polyuria/phagia/dipsia- no      Visual problems- no Medications: Compliance- daily glimiperide Hypoglycemic symptoms- no  HYPERLIPIDEMIA Disease Monitoring: See symptoms for Hypertension Medications: Compliance- daily lipitor Right upper quadrant pain- no  Muscle aches- no  Monitoring Labs and Parameters Last A1C:  Lab Results  Component Value Date   HGBA1C 7.0 08/13/2018    Last Lipid:     Component Value Date/Time   CHOL 206 (H) 12/12/2016 1115   HDL 57 12/12/2016 1115   LDLDIRECT 147 (H) 05/20/2018 1118    Last Bmet  Potassium  Date Value Ref Range Status  09/13/2017 4.0 3.5 - 5.1 mmol/L Final   Sodium  Date Value Ref Range Status  09/13/2017 135 135 - 145 mmol/L Final  08/28/2017 136 134 - 144 mmol/L Final   Creat  Date Value Ref Range Status  06/27/2016 0.88 0.50 - 0.99 mg/dL Final    Comment:      For patients > or = 68 years of age: The upper reference limit for Creatinine is approximately 13% higher for people identified as African-American.      Creatinine, Ser  Date Value Ref Range Status  09/13/2017 0.58 0.44 - 1.00 mg/dL Final      Last BPs:  BP Readings from Last 3 Encounters:  05/20/18 (!) 170/88  02/12/18 138/78  12/25/17 130/80       ROS: per HPI  Pertinent PMHx: PL  reviewed  Exam:  Respiratory: normal  Assessment/Plan:  Diabetes mellitus type 2 in obese ([place]) Sounds in good control.  Have come in for labs and checkup in 1-2 months  Essential hypertension Sounds at goal.  Is adherent.    Hyperlipidemia Taking lipitor regularly   Tubulovillous adenoma Prefers a GI in [place].  Will refer     Time spent during visit with patient: 12 minutes

## 2019-02-17 NOTE — Assessment & Plan Note (Signed)
Taking lipitor regularly

## 2019-02-17 NOTE — Assessment & Plan Note (Signed)
Sounds in good control.  Have come in for labs and checkup in 1-2 months

## 2019-02-17 NOTE — Telephone Encounter (Signed)
DOD 02/17/19 PM  Dr. Carlean Purl, there is a referral for pt to have a 5-year surveillance colonoscopy.  Records from 2015 colonoscopy from Dr. Alice Reichert at [facility] are in [place] for review.   Please advise scheduling.

## 2019-02-17 NOTE — Telephone Encounter (Signed)
OK to schedule colonoscopy due to hx adenomatous colon polyp

## 2019-02-26 ENCOUNTER — Other Ambulatory Visit: Payer: Self-pay

## 2019-02-26 ENCOUNTER — Encounter (INDEPENDENT_AMBULATORY_CARE_PROVIDER_SITE_OTHER): Payer: Medicare Other | Admitting: Ophthalmology

## 2019-02-26 DIAGNOSIS — D3132 Benign neoplasm of left choroid: Secondary | ICD-10-CM | POA: Diagnosis not present

## 2019-02-26 DIAGNOSIS — I10 Essential (primary) hypertension: Secondary | ICD-10-CM

## 2019-02-26 DIAGNOSIS — E11311 Type 2 diabetes mellitus with unspecified diabetic retinopathy with macular edema: Secondary | ICD-10-CM | POA: Diagnosis not present

## 2019-02-26 DIAGNOSIS — H43813 Vitreous degeneration, bilateral: Secondary | ICD-10-CM

## 2019-02-26 DIAGNOSIS — E113313 Type 2 diabetes mellitus with moderate nonproliferative diabetic retinopathy with macular edema, bilateral: Secondary | ICD-10-CM

## 2019-02-26 DIAGNOSIS — H35033 Hypertensive retinopathy, bilateral: Secondary | ICD-10-CM | POA: Diagnosis not present

## 2019-03-04 ENCOUNTER — Encounter: Payer: Self-pay | Admitting: Internal Medicine

## 2019-03-09 ENCOUNTER — Other Ambulatory Visit: Payer: Self-pay | Admitting: Family Medicine

## 2019-03-10 DIAGNOSIS — H318 Other specified disorders of choroid: Secondary | ICD-10-CM | POA: Diagnosis not present

## 2019-03-10 DIAGNOSIS — E113393 Type 2 diabetes mellitus with moderate nonproliferative diabetic retinopathy without macular edema, bilateral: Secondary | ICD-10-CM | POA: Diagnosis not present

## 2019-04-10 ENCOUNTER — Encounter: Payer: Medicare Other | Admitting: Internal Medicine

## 2019-05-07 ENCOUNTER — Encounter (INDEPENDENT_AMBULATORY_CARE_PROVIDER_SITE_OTHER): Payer: Medicare Other | Admitting: Ophthalmology

## 2019-05-07 ENCOUNTER — Other Ambulatory Visit: Payer: Self-pay

## 2019-05-07 DIAGNOSIS — H35033 Hypertensive retinopathy, bilateral: Secondary | ICD-10-CM | POA: Diagnosis not present

## 2019-05-07 DIAGNOSIS — E113312 Type 2 diabetes mellitus with moderate nonproliferative diabetic retinopathy with macular edema, left eye: Secondary | ICD-10-CM

## 2019-05-07 DIAGNOSIS — D3132 Benign neoplasm of left choroid: Secondary | ICD-10-CM

## 2019-05-07 DIAGNOSIS — I10 Essential (primary) hypertension: Secondary | ICD-10-CM

## 2019-05-07 DIAGNOSIS — E11311 Type 2 diabetes mellitus with unspecified diabetic retinopathy with macular edema: Secondary | ICD-10-CM | POA: Diagnosis not present

## 2019-05-07 DIAGNOSIS — H43813 Vitreous degeneration, bilateral: Secondary | ICD-10-CM

## 2019-05-07 DIAGNOSIS — E113391 Type 2 diabetes mellitus with moderate nonproliferative diabetic retinopathy without macular edema, right eye: Secondary | ICD-10-CM

## 2019-05-12 ENCOUNTER — Other Ambulatory Visit: Payer: Self-pay | Admitting: Family Medicine

## 2019-05-12 ENCOUNTER — Ambulatory Visit (INDEPENDENT_AMBULATORY_CARE_PROVIDER_SITE_OTHER): Payer: Medicare Other | Admitting: Family Medicine

## 2019-05-12 ENCOUNTER — Other Ambulatory Visit: Payer: Self-pay

## 2019-05-12 VITALS — BP 145/85 | HR 88 | Wt 232.8 lb

## 2019-05-12 DIAGNOSIS — E1169 Type 2 diabetes mellitus with other specified complication: Secondary | ICD-10-CM

## 2019-05-12 DIAGNOSIS — E669 Obesity, unspecified: Secondary | ICD-10-CM | POA: Diagnosis not present

## 2019-05-12 DIAGNOSIS — R42 Dizziness and giddiness: Secondary | ICD-10-CM | POA: Diagnosis not present

## 2019-05-12 LAB — POCT GLYCOSYLATED HEMOGLOBIN (HGB A1C): HbA1c, POC (controlled diabetic range): 12.7 % — AB (ref 0.0–7.0)

## 2019-05-12 MED ORDER — METFORMIN HCL ER 500 MG PO TB24: 500.0000 mg | ORAL_TABLET | Freq: Every day | ORAL | 3 refills | Status: DC

## 2019-05-12 MED ORDER — MECLIZINE HCL 12.5 MG PO TABS: 12.5000 mg | ORAL_TABLET | Freq: Three times a day (TID) | ORAL | 0 refills | Status: DC | PRN

## 2019-05-12 NOTE — Progress Notes (Signed)
Subjective  Jacqueline Goodman is a 68 y.o. female is presenting with the following  VERTIGO Pt presents with intermittent vertigo for 3 days. She states that symptoms began after rising from the commode and leaning down and reaching for an object in her bathtub. She reports that the room began to spin, shifting "side to side". Pt also lost her balance at that time and fell backwards, hitting the right side of her head against a sink.  Did not lose consciousness.  Dtr felt she had a small bruise but did not have any other symptoms   Pt reports that symptoms of room spinning are worse with head movement and lying down. Pt also reports nausea and vomiting (twice daily) since onset. She also complains of ringing in her ears, which is worse on the left side, and gait/postural instability. She denies any recent illness.  Vomited several times over the last days when vertigo became severe. No headache or visual changes (loss or double vision).  No other falls or weakness in an arm or leg.  No difficulty speaking or confusion No previous history of vertigo noted.   DIABETES Has been taking amaryl once a day.  Has been eating more sweets than in past.  Has felt thirsty frequently.   Has gained weight since last visit.  Able to keep down liquids and solids except for intermittent episodes of nausea with vertigo   Chief Complaint noted Review of Symptoms - see HPI PMH - Pt past medical history is significant for diabetes, managed with glimepiride. Smoking status noted.    Objective Vital Signs reviewed BP (!) 145/85   Pulse 88   Wt 232 lb 12.8 oz (105.6 kg)   SpO2 100%   BMI 34.38 kg/m    Physical Exam Heart: Regular rate & rhythm. No murmurs, rubs or gallops.   Lungs: No increased work of breathing. Clear to auscultation, bilaterally.  Awake alert knows her medications, PMH, current situation, frequently joking Neurologic exam : Cn 2-7 intact Strength equal & normal in upper & lower  extremities Finger to nose and heel to shin intact bilaterally when sitting PERRL EOMI  No nystagmus noted Able to fully move neck without pain but causes vertigo Able to stand but feel very unsteady and has vertigo  Scalp no signs of trauma or brusing or tenderness TM - Left TM lack of light reflex appears to have effusion.  R normal Skin - no lesions on head or neck   Assessments/Plans  See after visit summary for details of patient instuctions  Vertigo Vertigo is likely due to inner ear disequilibrium, secondary to BPPV. Other causes given ear ringing could be early [place].  I think CNS disease either CVA or subdural is very unlikely given lack of headache, normal neuro exam except for balance.  Patient is not on blood thinners.  I think her symptoms are also exacerbated by hyperglycemia from uncontrolled diabetes.  Diabetes mellitus type 2 in obese ([facility]) Worsened.  Likely due to change in diet.  Will increase amaryl and try low dose extended release metformin (had diarrhea with regular dose metformin in distant past).  She plans to change her diet - she has done so in past.  Likely will need additional oral agent espeically if cannot tolerate metformin

## 2019-05-12 NOTE — Patient Instructions (Addendum)
Good to see you today!  Thanks for coming in.  You have an Inner Ear Problem - Vertigo - Move your head around frequenlty to try to cause your dizziness - If you have a lot of nausea then take the melcizine one tab every 6 hours as needed   Your diabetes is out of control - Take glimepiride Amaryl 2 mg - one tab twice a day - Start Metformin 500 mg one every AM - cut out sweets  Check your blood sugar every morrning Call me with your blood sugars on Thursday  I will call Glenard Haring this afternoon or tomorrow  If you have dble vision or loss of vision or severe headache or weakness on one side then go to the ER or cal 911  If you vomit so much that you cant keep down your medications then call me    Come back in two weeks

## 2019-05-12 NOTE — Assessment & Plan Note (Addendum)
Worsened.  Likely due to change in diet.  Will increase amaryl and try low dose extended release metformin (had diarrhea with regular dose metformin in distant past).  She plans to change her diet - she has done so in past.  Likely will need additional oral agent espeically if cannot tolerate metformin

## 2019-05-12 NOTE — Assessment & Plan Note (Addendum)
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 Other causes given ear ringing could be early Menieres.  I think CNS disease either CVA or subdural is very unlikely given lack of headache, normal neuro exam except for balance.  Patient is not on blood thinners.  I think her symptoms are also exacerbated by hyperglycemia from uncontrolled diabetes.

## 2019-05-12 NOTE — Telephone Encounter (Signed)
Pt states she was told to start taking her glimepiride twice a day so she needs a refill with enough pills to be able to do that. Please advise.

## 2019-05-13 ENCOUNTER — Encounter: Payer: Self-pay | Admitting: Family Medicine

## 2019-05-13 ENCOUNTER — Telehealth: Payer: Self-pay | Admitting: Family Medicine

## 2019-05-13 LAB — CBC
Hematocrit: 38 % (ref 34.0–46.6)
Hemoglobin: 12.7 g/dL (ref 11.1–15.9)
MCH: 28.9 pg (ref 26.6–33.0)
MCHC: 33.4 g/dL (ref 31.5–35.7)
MCV: 87 fL (ref 79–97)
Platelets: 245 10*3/uL (ref 150–450)
RBC: 4.39 x10E6/uL (ref 3.77–5.28)
RDW: 12.9 % (ref 11.7–15.4)
WBC: 8.3 10*3/uL (ref 3.4–10.8)

## 2019-05-13 LAB — CMP14+EGFR
ALT: 40 IU/L — ABNORMAL HIGH (ref 0–32)
AST: 30 IU/L (ref 0–40)
Albumin/Globulin Ratio: 1.5 (ref 1.2–2.2)
Albumin: 3.5 g/dL — ABNORMAL LOW (ref 3.8–4.8)
Alkaline Phosphatase: 90 IU/L (ref 39–117)
BUN/Creatinine Ratio: 30 — ABNORMAL HIGH (ref 12–28)
BUN: 29 mg/dL — ABNORMAL HIGH (ref 8–27)
Bilirubin Total: 0.3 mg/dL (ref 0.0–1.2)
CO2: 22 mmol/L (ref 20–29)
Calcium: 8.8 mg/dL (ref 8.7–10.3)
Chloride: 101 mmol/L (ref 96–106)
Creatinine, Ser: 0.98 mg/dL (ref 0.57–1.00)
GFR calc Af Amer: 69 mL/min/{1.73_m2} (ref >59)
GFR calc non Af Amer: 60 mL/min/{1.73_m2} (ref >59)
Globulin, Total: 2.3 g/dL (ref 1.5–4.5)
Glucose: 335 mg/dL — ABNORMAL HIGH (ref 65–99)
Potassium: 4.3 mmol/L (ref 3.5–5.2)
Sodium: 136 mmol/L (ref 134–144)
Total Protein: 5.8 g/dL — ABNORMAL LOW (ref 6.0–8.5)

## 2019-05-13 NOTE — Telephone Encounter (Signed)
Spoke with patient and daughter She is feeling better but still very dizzy.  No  headache or vomiting or visual changes  BS 300 this AM  Start metformin 1/2 tab every AM then go to whole tab  Asked to send me a my chart message every day  Call if any worsening

## 2019-05-18 ENCOUNTER — Other Ambulatory Visit: Payer: Self-pay | Admitting: Family Medicine

## 2019-05-18 NOTE — Telephone Encounter (Signed)
Patient needs the Me[facility]ic[facility] filled [facility].

## 2019-05-23 ENCOUNTER — Other Ambulatory Visit: Payer: Self-pay | Admitting: Family Medicine

## 2019-05-26 ENCOUNTER — Ambulatory Visit (INDEPENDENT_AMBULATORY_CARE_PROVIDER_SITE_OTHER): Payer: Medicare Other | Admitting: Family Medicine

## 2019-05-26 ENCOUNTER — Encounter: Payer: Self-pay | Admitting: Family Medicine

## 2019-05-26 ENCOUNTER — Other Ambulatory Visit: Payer: Self-pay

## 2019-05-26 DIAGNOSIS — E669 Obesity, unspecified: Secondary | ICD-10-CM

## 2019-05-26 DIAGNOSIS — R42 Dizziness and giddiness: Secondary | ICD-10-CM

## 2019-05-26 DIAGNOSIS — I10 Essential (primary) hypertension: Secondary | ICD-10-CM | POA: Diagnosis not present

## 2019-05-26 DIAGNOSIS — E1169 Type 2 diabetes mellitus with other specified complication: Secondary | ICD-10-CM

## 2019-05-26 NOTE — Assessment & Plan Note (Addendum)
Patient has experienced intermittent dizziness and left-sided tinnitus for 2 weeks. Dizziness worsens with rotation of head and is slightly improved with Meclizine use. On physical exam, patient has horizontal nystagmus, and induction of dizziness with head flexion/extension. 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 Meniere's disease also considered given history of left-sided tinnitus; however, patient's lack of hearing loss and relatively brief, intermittent nature of symptoms make this less likely. CNS hemorrhage and malignancy also considered but very unlikely given lack of external signs of trauma, lack of headache, and normal cranial nerve findings. Patient advised to continue taking Meclizine PRN (up to 3 times daily) to manage dizziness. Also encouraged to try to rotate head in ways that induce vertigo and to increase activity by walking at least 4 times/day for more than 10 min. Patient told to follow-up with an update on her symptoms via MyChart in 1 week to determine if physical therapy appointment required.

## 2019-05-26 NOTE — Progress Notes (Signed)
S 

## 2019-05-26 NOTE — Assessment & Plan Note (Addendum)
Blood pressure elevated today at 152/82. Has been taking medication as prescribed. Patient attributes elevation to stress from commute and thinks blood pressure will be lower at home. Advised to monitor blood pressure at home and follow-up with measurements in 1 week via MyChart. Will determine if medication adjustment required based on home blood pressure readings.

## 2019-05-26 NOTE — Progress Notes (Signed)
Subjective  CC: Follow-up  [id number] Jacqueline Goodman is a 68 y.o. female who presents today with the following problems:  VERTIGO Patient presents with a 2 week history of intermittent vertigo and left-sided tinnitus. She was last seen at clinic on 9/15 shortly after symptom onset and presents today for a follow-up. Patient was prescribed Meclizine on 9/15 to try to reduce dizziness and reports only mild relief with the medication. She states that symptoms of vertigo worsen with rotation of the head, lying down, and leaning forward. Episodes of vertigo last for minutes at a time. Patient has not experienced vomiting since previous clinic visit but has continued to feel nauseas. Patient also reports left internal ear pain for the past day. Denies headache, vision changes (loss or double vision), hearing loss, or loss on consciousness. No difficulty speaking or confusion. No weakness, numbness, or sensory loss of extremities noted. Denies recent fever, cough, congestion, chills. No prior history of vertigo noted.    DIABETES  Disease Monitoring: Patient has been monitoring blood glucose levels at home after A1c measured at 12.7% during previous clinic visit on 9/15. She has also reduced sugar intake since that time.   Blood Sugar ranges- 200s-300s, declining;  Polyuria- no; New Visual problems- no  Medications:  Compliance- Extended release Metformin prescribed on 9/15 but patient has not taken it due to side effects of GI upset, diarrhea. Amaryl (2mg , tablet) dosage was also increased on 9/15 (from once daily to twice daily), which patient has been able to tolerate and has taken as prescribed Hypoglycemic symptoms- no   HYPERTENSION Disease Monitoring:  Home BP Monitoring not doing  Chest pain- no      Dyspnea-  yes, with exertion for past week  Medications:  Compliance: taking as prescribed.  Lightheadedness-  yes  Edema-  no    Pertinent PMFSHx: Family history of vertigo in older sister  noted.  ROS: Pertinent ROS included in HPI.     Objective  Physical Exam:  BP (!) 152/82    Pulse 96    Wt 231 lb 3.2 oz (104.9 kg)    SpO2 97%    BMI 34.14 kg/m   Gen: NAD, resting comfortably CV: RRR with no murmurs appreciated Pulm: NWOB, CTAB with no crackles, wheezes, or rhonchi Skin: warm, dry MSK: no edema Head: atraumatic, normocephalic, no lesions, no bruising, no tenderness Left Ear: TMs clear, non-bulging, non-erythematous, cone of light not appreciated; external ear normal, non-tender Right Ear: TMs clear, non-bulging, non-erythematous; external ear normal, non-tender Eyes: Right-sided slight horizontal nystagmus noted; EOMI, PERRL bilaterally Neurologic exam: CN II-XII intact  Positive Romberg Able to extend and flex neck fully without pain but this induces vertigo Able to stand and walk but this induces vertigo and postural instability. Able to steady herself on furniture no near falls  Strength equal and normal in all extremities Sensation equal and normal in all extremities    Assessment & Plan    Problem List Items Addressed This Visit      Cardiovascular and Mediastinum   Essential hypertension    Blood pressure elevated today at 152/82. Has been taking medication as prescribed. Patient attributes elevation to stress from commute and thinks blood pressure will be lower at home. Advised to monitor blood pressure at home and follow-up with measurements in 1 week via MyChart. Will determine if medication adjustment required based on home blood pressure readings.         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 Medications adjusted at that time with addition of Metformin and increased Amaryl dosage. Patient did not tolerate Metformin but has taken Amaryl as prescribed. Home blood glucose levels declining steadily for past 2 weeks but most readings remain elevated >200 mg/dL Patient  encouraged to maintain low-sugar diet and increase physical activity. Advised to continue monitoring blood glucose levels at home and follow-up in 1 week via MyChart with measurements. Will determine if additional agent needed based on follow-up blood glucose levels.         Other   Vertigo    Patient has experienced intermittent dizziness and left-sided tinnitus for 2 weeks. Dizziness worsens with rotation of head and is slightly improved with Meclizine use. On physical exam, patient has horizontal nystagmus, and induction of dizziness with head flexion/extension. 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 Meniere's disease also considered given history of left-sided tinnitus; however, patient's lack of hearing loss and relatively brief, intermittent nature of symptoms make this less likely. CNS hemorrhage and malignancy also considered but very unlikely given lack of external signs of trauma, lack of headache, and normal cranial nerve findings. Patient advised to continue taking Meclizine PRN (up to 3 times daily) to manage dizziness. Also encouraged to try to rotate head in ways that induce vertigo and to increase activity by walking at least 4 times/day for more than 10 min. Patient told to follow-up with an update on her symptoms via MyChart in 1 week to determine if physical therapy appointment required.          Follow-up: Call back to report progress in 1 week.    Gwynneth Albright, Medical Student   I was present during history and physical and agree with above Jacqueline Goodman

## 2019-05-26 NOTE — Assessment & Plan Note (Signed)
Poorly controlled per prior A1c (12.7%) on 05/12/19. Medications adjusted at that time with addition of Metformin and increased Amaryl dosage. Patient did not tolerate Metformin but has taken Amaryl as prescribed. Home blood glucose levels declining steadily for past 2 weeks but most readings remain elevated >200 mg/dL Patient encouraged to maintain low-sugar diet and increase physical activity. Advised to continue monitoring blood glucose levels at home and follow-up in 1 week via MyChart with measurements. Will determine if additional agent needed based on follow-up blood glucose levels.

## 2019-05-26 NOTE — Patient Instructions (Addendum)
It was great seeing you today! Thank you for coming in. We discussed the following during today's visit:   Dizziness:  Continue taking the Antivert (Meclizine) as needed up to 3 times per day for the dizziness. Also, try to get up and go for a walk at least 4 times per day and practice doing head movements that bring on the dizziness. Contact me with an update on your dizziness in 1 week (10/7) via MyChart so we can continue monitoring and reach out to physical therapy if needed.   Diabetes:  Keep up the good work with cutting sugary foods out of your diet. Also, continue monitoring your daily blood sugar levels at home. Please share your blood sugar measurements for the week with me via MyChart in 1 week (Wednesday; 10/7).  Hypertension:  Begin monitoring daily blood pressure daily at home. Please share blood pressure readings from the week with me via MyChart in 1 week (Wednesday; 10/7).

## 2019-05-27 ENCOUNTER — Encounter: Payer: Self-pay | Admitting: Family Medicine

## 2019-06-03 ENCOUNTER — Encounter: Payer: Self-pay | Admitting: Gynecology

## 2019-06-11 ENCOUNTER — Other Ambulatory Visit: Payer: Self-pay | Admitting: Family Medicine

## 2019-06-24 ENCOUNTER — Other Ambulatory Visit: Payer: Self-pay | Admitting: Family Medicine

## 2019-07-09 ENCOUNTER — Encounter (INDEPENDENT_AMBULATORY_CARE_PROVIDER_SITE_OTHER): Payer: Medicare Other | Admitting: Ophthalmology

## 2019-07-09 DIAGNOSIS — H35033 Hypertensive retinopathy, bilateral: Secondary | ICD-10-CM | POA: Diagnosis not present

## 2019-07-09 DIAGNOSIS — E11311 Type 2 diabetes mellitus with unspecified diabetic retinopathy with macular edema: Secondary | ICD-10-CM

## 2019-07-09 DIAGNOSIS — E113391 Type 2 diabetes mellitus with moderate nonproliferative diabetic retinopathy without macular edema, right eye: Secondary | ICD-10-CM | POA: Diagnosis not present

## 2019-07-09 DIAGNOSIS — E113312 Type 2 diabetes mellitus with moderate nonproliferative diabetic retinopathy with macular edema, left eye: Secondary | ICD-10-CM | POA: Diagnosis not present

## 2019-07-09 DIAGNOSIS — H43813 Vitreous degeneration, bilateral: Secondary | ICD-10-CM | POA: Diagnosis not present

## 2019-07-09 DIAGNOSIS — I10 Essential (primary) hypertension: Secondary | ICD-10-CM | POA: Diagnosis not present

## 2019-07-09 DIAGNOSIS — D3132 Benign neoplasm of left choroid: Secondary | ICD-10-CM

## 2019-07-14 ENCOUNTER — Other Ambulatory Visit: Payer: Self-pay | Admitting: Family Medicine

## 2019-07-25 ENCOUNTER — Other Ambulatory Visit: Payer: Self-pay | Admitting: Family Medicine

## 2019-08-06 ENCOUNTER — Other Ambulatory Visit: Payer: Self-pay | Admitting: Family Medicine

## 2019-08-11 ENCOUNTER — Other Ambulatory Visit: Payer: Self-pay

## 2019-08-11 ENCOUNTER — Ambulatory Visit (INDEPENDENT_AMBULATORY_CARE_PROVIDER_SITE_OTHER): Payer: Medicare Other | Admitting: Family Medicine

## 2019-08-11 ENCOUNTER — Encounter: Payer: Self-pay | Admitting: Family Medicine

## 2019-08-11 VITALS — BP 170/88 | HR 88 | Wt 228.2 lb

## 2019-08-11 DIAGNOSIS — E669 Obesity, unspecified: Secondary | ICD-10-CM

## 2019-08-11 DIAGNOSIS — R42 Dizziness and giddiness: Secondary | ICD-10-CM

## 2019-08-11 DIAGNOSIS — I10 Essential (primary) hypertension: Secondary | ICD-10-CM | POA: Diagnosis not present

## 2019-08-11 DIAGNOSIS — E1169 Type 2 diabetes mellitus with other specified complication: Secondary | ICD-10-CM

## 2019-08-11 LAB — POCT GLYCOSYLATED HEMOGLOBIN (HGB A1C): HbA1c, POC (controlled diabetic range): 10.5 % — AB (ref 0.0–7.0)

## 2019-08-11 MED ORDER — EMPAGLIFLOZIN 10 MG PO TABS: 10.0000 mg | ORAL_TABLET | Freq: Every day | ORAL | 3 refills | Status: DC

## 2019-08-11 NOTE — Patient Instructions (Addendum)
Good to see you today!  Thanks for coming in.  Have [facility] check your blood pressure and write down and bring in.   Send in your blood pressure on my chart in one week  For the diabetes - take Jardiance 10 mg once a day.  If you get dehydrated the hold it. - keep taking the Amaryl - Send in your blood sugar by MyChart   We will talk about the mammogram and colonscopy when Covid is over  Take the antivert very rarely.  Move as much as you can which will help it improve  Come back in 3 months

## 2019-08-11 NOTE — Progress Notes (Signed)
Subjective  Jacqueline Goodman is a 68 y.o. female is presenting with the following  VERTIGO Slowly improving.  Talking antivert several times a day.  Unsure what happens if she doesn't. No falls no focal weakness  HYPERTENSION  Disease Monitoring  Blood pressure range-she forgot her readings Chest pain- no      Dyspnea- no Medications  Compliance- forgot to bring meds Lightheadedness- no   Edema- no    DIABETES  Disease Monitoring  Blood Sugar ranges-thinks lowest was in low 200s Polyuria- no New Visual problems- no  Medications  Compliance- did not bring her meds Hypoglycemic symptoms- no      HYPERLIPIDEMIA  Disease Monitoring  See symptoms for Hypertension  Medications  Compliance- thinks she is taking her cholesterol medication  RUQ pain- no  Muscle aches- no    ROS See HPI above   PMH Smoking Status noted    Chief Complaint noted Review of Symptoms - see HPI PMH - Smoking status noted.    Objective Vital Signs reviewed BP (!) 170/88   Pulse 88   Wt 228 lb 3.2 oz (103.5 kg)   SpO2 99%   BMI 33.70 kg/m  Able to rise and walk on her own.  Uses walk and arm for reassurance and light  balance assist  Assessments/Plans  Diabetes mellitus type 2 in obese [facility]) Not at goal.   Will start empagliflozin.  And monitor blood sugar at home  Essential hypertension Not at goal but she is unsure of her medications.  Monitor at home.  Likely will need to increase losartan  Vertigo Improving but she is anxious and has been taking antivert regularly.  Will wean on and see how progresses.  She does not want to do Physical Therapy due to covid fears    See after visit summary for details of patient instructions

## 2019-08-11 NOTE — Assessment & Plan Note (Signed)
Improving but she is anxious and has been taking antivert regularly.  Will wean on and see how progresses.  She does not want to do Physical Therapy due to covid fears

## 2019-08-11 NOTE — Assessment & Plan Note (Signed)
Not at goal but she is unsure of her medications.  Monitor at home.  Likely will need to increase losartan

## 2019-08-11 NOTE — Assessment & Plan Note (Signed)
Not at goal.   Will start empagliflozin.  And monitor blood sugar at home

## 2019-09-17 ENCOUNTER — Encounter (INDEPENDENT_AMBULATORY_CARE_PROVIDER_SITE_OTHER): Payer: Medicare Other | Admitting: Ophthalmology

## 2019-09-17 ENCOUNTER — Other Ambulatory Visit: Payer: Self-pay

## 2019-09-17 DIAGNOSIS — H43813 Vitreous degeneration, bilateral: Secondary | ICD-10-CM

## 2019-09-17 DIAGNOSIS — E11311 Type 2 diabetes mellitus with unspecified diabetic retinopathy with macular edema: Secondary | ICD-10-CM

## 2019-09-17 DIAGNOSIS — E113391 Type 2 diabetes mellitus with moderate nonproliferative diabetic retinopathy without macular edema, right eye: Secondary | ICD-10-CM

## 2019-09-17 DIAGNOSIS — H35033 Hypertensive retinopathy, bilateral: Secondary | ICD-10-CM

## 2019-09-17 DIAGNOSIS — I10 Essential (primary) hypertension: Secondary | ICD-10-CM

## 2019-09-17 DIAGNOSIS — D3132 Benign neoplasm of left choroid: Secondary | ICD-10-CM | POA: Diagnosis not present

## 2019-09-17 DIAGNOSIS — E113312 Type 2 diabetes mellitus with moderate nonproliferative diabetic retinopathy with macular edema, left eye: Secondary | ICD-10-CM

## 2019-09-17 LAB — HM DIABETES EYE EXAM

## 2019-09-28 ENCOUNTER — Telehealth: Payer: Self-pay

## 2019-09-28 MED ORDER — GLYCERIN (ADULT) 2 G RE SUPP: 1.0000 | RECTAL | 0 refills | Status: DC | PRN

## 2019-09-28 NOTE — Telephone Encounter (Signed)
Very little bowel movement for at least a week Hard stool in rectum No vomiting but mild diffuse abdomen pain No fever  Recommend miralax orally and fleets enema Also sent in supp.  If any worsening or not improving should be seen here  She agrees

## 2019-09-28 NOTE — Telephone Encounter (Signed)
Patient calls nurse line stating she has not had a "normal" BM in 1.5 weeks. Patient stated she is able to go, however hard to pass and hard pebble like stools. Patient does have a hx of hemorrhoids, she does deny any blood with recent BMs. Patient has tried miralax with no relief. Patient is requesting a suppository to be sent into her pharmacy. Please advise.

## 2019-09-28 NOTE — Telephone Encounter (Signed)
Pt calls nurse line again to check on status of receiving a suppository. Patient states that she is in a great deal of pain and is demanding to speak with provider. Informed patient that she could pick up over-the-counter suppository, however, patient states that these do not work.   Please advise  Talbot Grumbling, RN

## 2019-11-10 ENCOUNTER — Other Ambulatory Visit: Payer: Self-pay

## 2019-11-10 ENCOUNTER — Encounter: Payer: Self-pay | Admitting: Family Medicine

## 2019-11-10 ENCOUNTER — Ambulatory Visit (INDEPENDENT_AMBULATORY_CARE_PROVIDER_SITE_OTHER): Payer: Medicare Other | Admitting: Family Medicine

## 2019-11-10 VITALS — BP 118/72 | HR 104 | Ht 69.0 in | Wt 214.6 lb

## 2019-11-10 DIAGNOSIS — D369 Benign neoplasm, unspecified site: Secondary | ICD-10-CM

## 2019-11-10 DIAGNOSIS — I10 Essential (primary) hypertension: Secondary | ICD-10-CM | POA: Diagnosis not present

## 2019-11-10 DIAGNOSIS — E669 Obesity, unspecified: Secondary | ICD-10-CM

## 2019-11-10 DIAGNOSIS — R42 Dizziness and giddiness: Secondary | ICD-10-CM

## 2019-11-10 DIAGNOSIS — E7849 Other hyperlipidemia: Secondary | ICD-10-CM | POA: Diagnosis not present

## 2019-11-10 DIAGNOSIS — E1169 Type 2 diabetes mellitus with other specified complication: Secondary | ICD-10-CM

## 2019-11-10 LAB — POCT GLYCOSYLATED HEMOGLOBIN (HGB A1C): HbA1c, POC (controlled diabetic range): 12.4 % — AB (ref 0.0–7.0)

## 2019-11-10 NOTE — Patient Instructions (Addendum)
Good to see you today!  Thanks for coming in.  Every day do your Kitchen Walk - 3 times every day and move up until you are doing at least 20-30 minutes of walking every day  Your diabetes is uncontrolled.  BRING ALL YOUR MEDICATION BOTTLES NEXT VISIT  Medications  IN the AM around 830  Empagliflozin 10 mg   Amaryl 2 mg  Losartan 25 mg  Atorvastatin 40 mg   At 830 in the Evening  Amaryl 2 mg   Call your Gastroenterologist to set up your Colonoscopy  Or [facility] [phone]  Come back in 2-3 weeks

## 2019-11-10 NOTE — Assessment & Plan Note (Signed)
Encouraged to contact GI

## 2019-11-10 NOTE — Assessment & Plan Note (Signed)
Improved by history and exam today.  No focal signs of CVA or tumor

## 2019-11-10 NOTE — Assessment & Plan Note (Signed)
Unsure of control of if taking lipitor

## 2019-11-10 NOTE — Assessment & Plan Note (Signed)
Poorly controlled. Likely not taking medications.  See after visit summary and come back in two weeks with medications

## 2019-11-10 NOTE — Progress Notes (Signed)
    SUBJECTIVE:   CHIEF COMPLAINT / HPI:   HYPERTENSION Disease Monitoring: Blood pressure range-not checking Chest pain, palpitations- no       Medications: Compliance- unsure of medications and did not bring Lightheadedness,Syncope- no   Edema- no  DIABETES Disease Monitoring: Blood Sugar ranges-not checking Unsure if she has a meter  Medications: Compliance- unsure Hypoglycemic symptoms- no  HYPERLIPIDEMIA Medications: Compliance- unsure Right upper quadrant pain- no  Muscle aches- no  DIZZINESS She feels this is improved  PERTINENT  PMH / PSH: Does not want flu or covid shots  OBJECTIVE:   BP 118/72   Pulse (!) 104   Ht 5\' 9"  (1.753 m)   Wt 214 lb 9.6 oz (97.3 kg)   SpO2 99%   BMI 31.69 kg/m   Alert knows me and what happened at prior visits Able to stand walk around room slightly slow but without assistance Neurologic exam : Cn 2-7 intact Strength equal & normal in upper & lower extremities Able to stand with balance assist on heels and toes.   Romberg normal, finger to nose normal PERRL   ASSESSMENT/PLAN:   Diabetes mellitus type 2 in obese (HCC) Poorly controlled. Likely not taking medications.  See after visit summary and come back in two weeks with medications   Essential hypertension At goal.  Continue Losartan   Hyperlipidemia Unsure of control of if taking lipitor  Vertigo Improved by history and exam today.  No focal signs of CVA or tumor   Tubulovillous adenoma Encouraged to contact [place], MD [place]

## 2019-11-10 NOTE — Assessment & Plan Note (Signed)
At goal.  Continue Losartan

## 2019-11-19 ENCOUNTER — Encounter (INDEPENDENT_AMBULATORY_CARE_PROVIDER_SITE_OTHER): Payer: Medicare Other | Admitting: Ophthalmology

## 2019-11-19 DIAGNOSIS — E11311 Type 2 diabetes mellitus with unspecified diabetic retinopathy with macular edema: Secondary | ICD-10-CM | POA: Diagnosis not present

## 2019-11-19 DIAGNOSIS — H43813 Vitreous degeneration, bilateral: Secondary | ICD-10-CM | POA: Diagnosis not present

## 2019-11-19 DIAGNOSIS — D3132 Benign neoplasm of left choroid: Secondary | ICD-10-CM | POA: Diagnosis not present

## 2019-11-19 DIAGNOSIS — E113391 Type 2 diabetes mellitus with moderate nonproliferative diabetic retinopathy without macular edema, right eye: Secondary | ICD-10-CM

## 2019-11-19 DIAGNOSIS — H35033 Hypertensive retinopathy, bilateral: Secondary | ICD-10-CM | POA: Diagnosis not present

## 2019-11-19 DIAGNOSIS — I10 Essential (primary) hypertension: Secondary | ICD-10-CM | POA: Diagnosis not present

## 2019-11-19 DIAGNOSIS — E113312 Type 2 diabetes mellitus with moderate nonproliferative diabetic retinopathy with macular edema, left eye: Secondary | ICD-10-CM

## 2019-12-08 ENCOUNTER — Ambulatory Visit: Payer: Medicare Other | Admitting: Family Medicine

## 2019-12-22 ENCOUNTER — Ambulatory Visit (INDEPENDENT_AMBULATORY_CARE_PROVIDER_SITE_OTHER): Payer: Medicare Other | Admitting: Family Medicine

## 2019-12-22 ENCOUNTER — Other Ambulatory Visit: Payer: Self-pay

## 2019-12-22 ENCOUNTER — Encounter: Payer: Self-pay | Admitting: Family Medicine

## 2019-12-22 DIAGNOSIS — I10 Essential (primary) hypertension: Secondary | ICD-10-CM | POA: Diagnosis not present

## 2019-12-22 DIAGNOSIS — E669 Obesity, unspecified: Secondary | ICD-10-CM

## 2019-12-22 DIAGNOSIS — E1169 Type 2 diabetes mellitus with other specified complication: Secondary | ICD-10-CM

## 2019-12-22 DIAGNOSIS — R42 Dizziness and giddiness: Secondary | ICD-10-CM

## 2019-12-22 MED ORDER — EMPAGLIFLOZIN 10 MG PO TABS: 10.0000 mg | ORAL_TABLET | Freq: Every day | ORAL | 0 refills | Status: DC

## 2019-12-22 MED ORDER — LOSARTAN POTASSIUM 25 MG PO TABS: 25.0000 mg | ORAL_TABLET | Freq: Every day | ORAL | 0 refills | Status: DC

## 2019-12-22 NOTE — Patient Instructions (Addendum)
Good to see you today!  Thanks for coming in.  I sent in a refill for   Jardiance 10 mg daily  Losartan 25 mg daily  You should be taking 4 medications  Atorvastatin 40 mg every morning  Jardiance 10 mg every morning  Losartan 25 mg every morning  Glimepiride 2 mg every morning    I will call in a day or two to talk to [facility]  Please bring all your medications and come with Glenard Haring next visit  Come back in one month

## 2019-12-22 NOTE — Assessment & Plan Note (Signed)
Not controlled.  See after visit summary.   Concerned she is not taking medications regularly

## 2019-12-22 NOTE — Assessment & Plan Note (Signed)
Refilled her losartan

## 2019-12-22 NOTE — Assessment & Plan Note (Signed)
Improved ambulation

## 2019-12-22 NOTE — Progress Notes (Signed)
    SUBJECTIVE:   CHIEF COMPLAINT / HPI:   Diabetes Has not been checking her blood sugar that she recalls.  Unsure of how many medications she takes but brings in 3 (missing Jardiance)  Her Losartan is empty   PERTINENT  PMH / PSH: lives with husband and daughter   OBJECTIVE:   BP 138/72   Pulse 86   Ht 5\' 9"  (1.753 m)   Wt 225 lb 12.8 oz (102.4 kg)   SpO2 97%   BMI 33.34 kg/m   Alert interactive Not sure when last took medications or their names Able to stand walk around room slowly without assitance   ASSESSMENT/PLAN:   Diabetes mellitus type 2 in obese [facility]) Not controlled.  See after visit summary.   Concerned she is not taking medications regularly   Vertigo Improved ambulation   Essential hypertension Refilled her losartan      Lind Covert, [place]

## 2020-01-04 ENCOUNTER — Other Ambulatory Visit: Payer: Self-pay | Admitting: Family Medicine

## 2020-01-26 ENCOUNTER — Ambulatory Visit: Payer: Medicare Other | Admitting: Family Medicine

## 2020-01-28 ENCOUNTER — Encounter (INDEPENDENT_AMBULATORY_CARE_PROVIDER_SITE_OTHER): Payer: Medicare Other | Admitting: Ophthalmology

## 2020-01-28 ENCOUNTER — Other Ambulatory Visit: Payer: Self-pay

## 2020-01-28 DIAGNOSIS — D3132 Benign neoplasm of left choroid: Secondary | ICD-10-CM | POA: Diagnosis not present

## 2020-01-28 DIAGNOSIS — H43813 Vitreous degeneration, bilateral: Secondary | ICD-10-CM | POA: Diagnosis not present

## 2020-01-28 DIAGNOSIS — E113312 Type 2 diabetes mellitus with moderate nonproliferative diabetic retinopathy with macular edema, left eye: Secondary | ICD-10-CM | POA: Diagnosis not present

## 2020-01-28 DIAGNOSIS — E11311 Type 2 diabetes mellitus with unspecified diabetic retinopathy with macular edema: Secondary | ICD-10-CM | POA: Diagnosis not present

## 2020-01-28 DIAGNOSIS — E113391 Type 2 diabetes mellitus with moderate nonproliferative diabetic retinopathy without macular edema, right eye: Secondary | ICD-10-CM

## 2020-01-28 DIAGNOSIS — I10 Essential (primary) hypertension: Secondary | ICD-10-CM | POA: Diagnosis not present

## 2020-01-28 DIAGNOSIS — H35033 Hypertensive retinopathy, bilateral: Secondary | ICD-10-CM | POA: Diagnosis not present

## 2020-02-02 ENCOUNTER — Ambulatory Visit (INDEPENDENT_AMBULATORY_CARE_PROVIDER_SITE_OTHER): Payer: Medicare Other | Admitting: Family Medicine

## 2020-02-02 ENCOUNTER — Other Ambulatory Visit: Payer: Self-pay

## 2020-02-02 ENCOUNTER — Encounter: Payer: Self-pay | Admitting: Family Medicine

## 2020-02-02 VITALS — BP 128/80 | HR 104 | Ht 69.0 in | Wt 226.0 lb

## 2020-02-02 DIAGNOSIS — E669 Obesity, unspecified: Secondary | ICD-10-CM | POA: Diagnosis not present

## 2020-02-02 DIAGNOSIS — E7849 Other hyperlipidemia: Secondary | ICD-10-CM | POA: Diagnosis not present

## 2020-02-02 DIAGNOSIS — E1169 Type 2 diabetes mellitus with other specified complication: Secondary | ICD-10-CM | POA: Diagnosis not present

## 2020-02-02 LAB — POCT GLYCOSYLATED HEMOGLOBIN (HGB A1C): HbA1c, POC (controlled diabetic range): 14.5 % — AB (ref 0.0–7.0)

## 2020-02-02 NOTE — Assessment & Plan Note (Signed)
Very poorly controlled.  She refuses to consider insulin, even once a day for a short while. She feels she can lower her blood sugar by stopping sweets and walking more.   Will double her amaryl and monitor blood sugar via her daughter and My Chart.  She may consider insulin if can not lower

## 2020-02-02 NOTE — Progress Notes (Signed)
    SUBJECTIVE:   CHIEF COMPLAINT / HPI:   DIABETES Has ability to check blood sugar but has not.  Taking amaryl once a day.  Took jardiance for a few weeks but felt it made her urinate a lot and stopped, she thinks about a month ago.  No low blood sugar.  Eating lots of ice cream.  Feels tired a lot No nausea and vomiting   CHOLESTEROL Taking lipitor daily, brings in her bottles   PERTINENT  PMH / PSH: comes with her daughter today.  She feels she can check blood sugar and report on My Chart.  They live together  OBJECTIVE:   BP 128/80   Pulse (!) 104   Ht 5\' 9"  (1.753 m)   Wt 226 lb (102.5 kg)   SpO2 95%   BMI 33.37 kg/m   Walks slowly Alert interactive good spirits   ASSESSMENT/PLAN:   Diabetes mellitus type 2 in obese (HCC) Very poorly controlled.  She refuses to consider insulin, even once a day for a short while. She feels she can lower her blood sugar by stopping sweets and walking more.   Will double her amaryl and monitor blood sugar via her daughter and My Chart.  She may consider insulin if can not lower   Hyperlipidemia Tolerating statin well continue      Lind Covert, [place]

## 2020-02-02 NOTE — Assessment & Plan Note (Signed)
Tolerating statin well continue

## 2020-02-02 NOTE — Patient Instructions (Signed)
Good to see you today!  Thanks for coming in.  For the diabetes  1. Check your blood sugar every morning before you eat. Send to me every 3-4 days on My Chart  2, Take the Glimepiride 2 mg twice a day at 8 AM and 8 PM  3, Stop all icecream and sweets.  Try to eat more veggies   4.  If you start to feel worse or have continued vomiting then that could be an emergency   5 If we can't get your blood sugar below 220s then we will have to start insulin   For the Sleep - Stop the [place]  - Don't take naps - Stay in the bed  No more than 8-9 hours   Keep walking   Come back in one month Bring all your medications and your blood sugar meter

## 2020-02-16 ENCOUNTER — Other Ambulatory Visit: Payer: Self-pay | Admitting: Family Medicine

## 2020-03-02 ENCOUNTER — Other Ambulatory Visit: Payer: Self-pay | Admitting: Family Medicine

## 2020-03-02 ENCOUNTER — Ambulatory Visit (INDEPENDENT_AMBULATORY_CARE_PROVIDER_SITE_OTHER): Payer: Medicare Other | Admitting: Family Medicine

## 2020-03-02 ENCOUNTER — Other Ambulatory Visit: Payer: Self-pay

## 2020-03-02 ENCOUNTER — Encounter: Payer: Self-pay | Admitting: Family Medicine

## 2020-03-02 VITALS — BP 142/82 | HR 91 | Wt 224.8 lb

## 2020-03-02 DIAGNOSIS — I10 Essential (primary) hypertension: Secondary | ICD-10-CM | POA: Diagnosis not present

## 2020-03-02 DIAGNOSIS — E1169 Type 2 diabetes mellitus with other specified complication: Secondary | ICD-10-CM | POA: Diagnosis not present

## 2020-03-02 DIAGNOSIS — E669 Obesity, unspecified: Secondary | ICD-10-CM

## 2020-03-02 DIAGNOSIS — F418 Other specified anxiety disorders: Secondary | ICD-10-CM | POA: Diagnosis not present

## 2020-03-02 LAB — GLUCOSE, POCT (MANUAL RESULT ENTRY): POC Glucose: 484 mg/dl — AB (ref 70–99)

## 2020-03-02 MED ORDER — BASAGLAR KWIKPEN 100 UNIT/ML ~~LOC~~ SOPN: 10.0000 [IU] | PEN_INJECTOR | Freq: Every day | SUBCUTANEOUS | 6 refills | Status: DC

## 2020-03-02 MED ORDER — BASAGLAR KWIKPEN 100 UNIT/ML ~~LOC~~ SOPN
10.0000 [IU] | PEN_INJECTOR | SUBCUTANEOUS | 11 refills | Status: DC
Start: 2020-03-02 — End: 2020-03-07

## 2020-03-02 MED ORDER — SITAGLIPTIN PHOSPHATE 25 MG PO TABS: 25.0000 mg | ORAL_TABLET | Freq: Every day | ORAL | 1 refills | Status: DC

## 2020-03-02 MED ORDER — "INSULIN SYRINGE-NEEDLE U-100 31G X 5/16"" 0.3 ML MISC": 1 refills | Status: DC

## 2020-03-02 MED ORDER — INSULIN GLARGINE 100 UNIT/ML SOLOSTAR PEN
10.0000 [IU] | PEN_INJECTOR | SUBCUTANEOUS | 11 refills | Status: DC
Start: 2020-03-02 — End: 2020-03-02

## 2020-03-02 NOTE — Progress Notes (Signed)
g   SUBJECTIVE:   CHIEF COMPLAINT / HPI:   DEPRESSION Circled 3 on question 9.  Feels like life is not worth living as tired and isolated as she feels.  No suicidal ideation or any plan.  Feels if she gets out more she will feel better (feels better coming to office today)  Plans to walk in early morning when cool.  If did feel suicidal would discuss with her daughter Glenard Haring and/or call our office  DIABETES Disease Monitoring: Blood Sugar range-not checking.Has cut back on food she is eating   Medications Adherence taking glipizide twice a day  Hypoglycemic symptoms- no     PERTINENT  PMH / PSH: does not wish any prevention items or Covid vacccine  OBJECTIVE:   BP (!) 142/82   Pulse 91   Wt 224 lb 12.8 oz (102 kg)   SpO2 98%   BMI 33.20 kg/m   Alert joking.  Psych:  Cognition and judgment appear intact. Alert, communicative  and cooperative with normal attention span and concentration. No apparent delusions, illusions, hallucinations   ASSESSMENT/PLAN:   Essential hypertension At goal today.  Asked to bring in medications   Diabetes mellitus type 2 in obese ([place]) Poorly controlled.  After long discussion agreed to insulin.  Start [employer].  She and daughter reviewed the video of how to administer on daughter's phone.  Daughter will help check blood sugar and administer and be in touch via MyChart.  Start at low dose and increase.  Will hopefully wean glipizide as blood sugar control improves   Situational anxiety Her elevated PHQ question 9 seems more feeling down due to covid isolation and not any suicidal ideation.  I think she will feel better if can get blood sugar better.  Will monitor closely      Lind Covert, [place]

## 2020-03-02 NOTE — Assessment & Plan Note (Signed)
At goal today.  Asked to bring in medications

## 2020-03-02 NOTE — Addendum Note (Signed)
Addended by: Talbot Grumbling on: 03/02/2020 04:20 PM   Modules accepted: Orders

## 2020-03-02 NOTE — Assessment & Plan Note (Signed)
Her elevated PHQ question 9 seems more feeling down due to covid isolation and not any suicidal ideation.  I think she will feel better if can get blood sugar better.  Will monitor closely

## 2020-03-02 NOTE — Patient Instructions (Addendum)
Good to see you today!  Thanks for coming in.  If you are feeling more depressed or have any thoughts of hurting yourself definitely let Glenard Haring and I know.  Measure your blood sugar every morning.  Inject 10 units of Basaglar every morning.  Send me your blood sugar reading vis MyChart.   See the video and instructions I gave you  Call me if any of your blood sugars are less than 100.  Come back and see me on 7/20

## 2020-03-02 NOTE — Assessment & Plan Note (Addendum)
Poorly controlled.  After long discussion agreed to insulin.  Start [employer].  She and daughter reviewed the video of how to administer on daughter's phone.  Daughter will help check blood sugar and administer and be in touch via MyChart.  Start at low dose and increase.  Will hopefully wean glipizide as blood sugar control improves

## 2020-03-03 ENCOUNTER — Encounter: Payer: Self-pay | Admitting: Family Medicine

## 2020-03-07 ENCOUNTER — Other Ambulatory Visit: Payer: Self-pay | Admitting: Family Medicine

## 2020-03-07 MED ORDER — BASAGLAR KWIKPEN 100 UNIT/ML ~~LOC~~ SOPN: 15.0000 [IU] | PEN_INJECTOR | SUBCUTANEOUS | 11 refills | Status: DC

## 2020-03-07 MED ORDER — "INSULIN SYRINGE-NEEDLE U-100 31G X 5/16"" 0.3 ML MISC": 1 refills | Status: DC

## 2020-03-08 MED ORDER — BD PEN NEEDLE NANO 2ND GEN 32G X 4 MM MISC: 2 refills | Status: DC

## 2020-03-08 NOTE — Addendum Note (Signed)
Addended by: Talbert Cage L on: 03/08/2020 02:29 PM   Modules accepted: Orders

## 2020-03-09 ENCOUNTER — Other Ambulatory Visit: Payer: Self-pay | Admitting: *Deleted

## 2020-03-09 MED ORDER — BD PEN NEEDLE NANO 2ND GEN 32G X 4 MM MISC: 2 refills | Status: DC

## 2020-03-11 ENCOUNTER — Other Ambulatory Visit: Payer: Self-pay | Admitting: Family Medicine

## 2020-03-15 ENCOUNTER — Other Ambulatory Visit: Payer: Self-pay

## 2020-03-15 ENCOUNTER — Ambulatory Visit (INDEPENDENT_AMBULATORY_CARE_PROVIDER_SITE_OTHER): Payer: Medicare Other | Admitting: Family Medicine

## 2020-03-15 ENCOUNTER — Encounter: Payer: Self-pay | Admitting: Family Medicine

## 2020-03-15 VITALS — BP 130/72 | HR 93 | Wt 228.4 lb

## 2020-03-15 DIAGNOSIS — E1169 Type 2 diabetes mellitus with other specified complication: Secondary | ICD-10-CM | POA: Diagnosis not present

## 2020-03-15 DIAGNOSIS — E669 Obesity, unspecified: Secondary | ICD-10-CM | POA: Diagnosis not present

## 2020-03-15 DIAGNOSIS — R634 Abnormal weight loss: Secondary | ICD-10-CM | POA: Diagnosis not present

## 2020-03-15 LAB — POCT CBG (FASTING - GLUCOSE)-MANUAL ENTRY: Glucose Fasting, POC: 312 mg/dL — AB (ref 70–99)

## 2020-03-15 NOTE — Patient Instructions (Signed)
Good to see you today - Thanks for coming in  Keep sending me your blood sugar readings.    If tomorrow is > 300 then give 28 units If tomorrow is between 250-300 - give 27 units  I will call you if your lab tests are not normal.  Otherwise we will discuss them at your next visit.   Please always bring your medication bottles  Come back to see me in 4 weeks

## 2020-03-16 LAB — CBC
Hematocrit: 41.2 % (ref 34.0–46.6)
Hemoglobin: 13.4 g/dL (ref 11.1–15.9)
MCH: 29.2 pg (ref 26.6–33.0)
MCHC: 32.5 g/dL (ref 31.5–35.7)
MCV: 90 fL (ref 79–97)
Platelets: 236 10*3/uL (ref 150–450)
RBC: 4.59 x10E6/uL (ref 3.77–5.28)
RDW: 12.3 % (ref 11.7–15.4)
WBC: 9.2 10*3/uL (ref 3.4–10.8)

## 2020-03-16 LAB — CMP14+EGFR
ALT: 65 IU/L — ABNORMAL HIGH (ref 0–32)
AST: 47 IU/L — ABNORMAL HIGH (ref 0–40)
Albumin/Globulin Ratio: 1.4 (ref 1.2–2.2)
Albumin: 3.7 g/dL — ABNORMAL LOW (ref 3.8–4.8)
Alkaline Phosphatase: 120 IU/L (ref 48–121)
BUN/Creatinine Ratio: 32 — ABNORMAL HIGH (ref 12–28)
BUN: 34 mg/dL — ABNORMAL HIGH (ref 8–27)
Bilirubin Total: 0.3 mg/dL (ref 0.0–1.2)
CO2: 21 mmol/L (ref 20–29)
Calcium: 9.2 mg/dL (ref 8.7–10.3)
Chloride: 100 mmol/L (ref 96–106)
Creatinine, Ser: 1.05 mg/dL — ABNORMAL HIGH (ref 0.57–1.00)
GFR calc Af Amer: 63 mL/min/{1.73_m2} (ref >59)
GFR calc non Af Amer: 55 mL/min/{1.73_m2} — ABNORMAL LOW (ref >59)
Globulin, Total: 2.6 g/dL (ref 1.5–4.5)
Glucose: 384 mg/dL — ABNORMAL HIGH (ref 65–99)
Potassium: 4.5 mmol/L (ref 3.5–5.2)
Sodium: 136 mmol/L (ref 134–144)
Total Protein: 6.3 g/dL (ref 6.0–8.5)

## 2020-03-16 LAB — TSH: TSH: 1.95 u[IU]/mL (ref 0.450–4.500)

## 2020-03-16 NOTE — Assessment & Plan Note (Signed)
Uncontrolled.  Continue titrate insulin and amaryl.   Eventual goal will be to get off Amaryl and retry SGLP2. Her insulin requirement is surprising.  Will check labs for liver renal or thyroid disorder

## 2020-03-16 NOTE — Progress Notes (Signed)
    SUBJECTIVE:   CHIEF COMPLAINT / HPI:   DIABETES Disease Monitoring: Blood Sugar range-see recent MyChart messages usually are > 350 slowly titrating insulin   Medications Adherence good Hypoglycemic symptoms- no     PERTINENT  PMH / PSH: her daughter is administering insulin  OBJECTIVE:   BP 130/72   Pulse 93   Wt 228 lb 6.4 oz (103.6 kg)   SpO2 97%   BMI 33.73 kg/m   Awake interactive No edema  ASSESSMENT/PLAN:   Diabetes mellitus type 2 in obese (HCC) Uncontrolled.  Continue titrate insulin and amaryl.   Eventual goal will be to get off Amaryl and retry SGLP2. Her insulin requirement is surprising.  Will check labs for liver renal or thyroid disorder       Lind Covert, MD [place]

## 2020-04-06 ENCOUNTER — Encounter: Payer: Self-pay | Admitting: Family Medicine

## 2020-04-06 ENCOUNTER — Telehealth: Payer: Self-pay

## 2020-04-06 MED ORDER — FLUCONAZOLE 150 MG PO TABS
150.0000 mg | ORAL_TABLET | Freq: Once | ORAL | 0 refills | Status: AC
Start: 2020-04-06 — End: 2020-04-06

## 2020-04-06 NOTE — Telephone Encounter (Signed)
Patient's daughter calls nurse line regarding receiving medication for yeast infection. Daughter reports that patient is having white vaginal discharge with vaginal itching. Patient has a follow up appointment on 8/17. If appropriate [facility] [place].   To PCP  Talbot Grumbling, RN

## 2020-04-12 ENCOUNTER — Ambulatory Visit: Payer: Medicare Other | Admitting: Family Medicine

## 2020-04-12 ENCOUNTER — Other Ambulatory Visit: Payer: Self-pay

## 2020-04-12 ENCOUNTER — Ambulatory Visit (INDEPENDENT_AMBULATORY_CARE_PROVIDER_SITE_OTHER): Payer: Medicare Other | Admitting: Family Medicine

## 2020-04-12 DIAGNOSIS — E669 Obesity, unspecified: Secondary | ICD-10-CM | POA: Diagnosis not present

## 2020-04-12 DIAGNOSIS — E1169 Type 2 diabetes mellitus with other specified complication: Secondary | ICD-10-CM

## 2020-04-12 DIAGNOSIS — I10 Essential (primary) hypertension: Secondary | ICD-10-CM | POA: Diagnosis not present

## 2020-04-12 MED ORDER — EMPAGLIFLOZIN 10 MG PO TABS
10.0000 mg | ORAL_TABLET | Freq: Every day | ORAL | 1 refills | Status: DC
Start: 2020-04-12 — End: 2020-04-26

## 2020-04-12 NOTE — Patient Instructions (Addendum)
Good to see you today!  Thanks for coming in.  For the diabetes  Continue to increase insulin as you are one unit every morning the blood sugar is greater than 250  Start the Jardiance 10 mg tab once every morning  Continue all the other medications as you are  Check her blood pressure every other day and write down   Come back in one month for blood pressure and A1c and blood test   Bring all your medications and your meter and your blood pressure cuff   Covid vaccine think about it

## 2020-04-12 NOTE — Progress Notes (Signed)
    SUBJECTIVE:   HPI:   DM Fasting blood sugars usually between 250-320 per MyChart messages. Slowly titrating insulin. Additionally, takes Glimepiride 2 mg twice a day. She maintains good medication adherence with daughter's help. Denies hypoglycemia symptoms.  Eats eggs for breakfast and dinner with fruit for lunch.  No sweet drinks and rare ice cream or desserts No feelings of low blood sugar   HTN She does not check blood pressure at home. She has eliminated bacon from diet with her daughter's help. She regularly eats eggs for breakfast. She drinks diet ginger ale infrequently, and eats ice cream sparingly.   Mood  Feeling a lot better. PHQ-9 of one today.    PERTINENT  PMH / PSH: Daughter helps manage chronic conditions  OBJECTIVE:   BP (!) 154/82   Pulse 93   Wt 233 lb 6.4 oz (105.9 kg)   SpO2 96%   BMI 34.47 kg/m    General: Well appearing, no acute distress. Pleasant and cooperative  HEENT: Normocephalic, atraumatic Pulmonary: normal work of breathing Neuro: Able to stand and walk slowly without assistance, no focal deficits  ASSESSMENT/PLAN:   HM Patient was counseled on COVID-19 vaccine. Desires more time to contemplate.  Diabetes mellitus type 2 in obese [facility]) Room for further improvement. Continue to increase insulin by one unit every morning the blood sugar is greater than 250. Additionally, continue Glimepiride 2 mg twice a day. Will add Jardiance 10 mg daily to regimen. While patient experienced notable urinary incontinence with Jardiance trial in the past, likely to have less significant symptoms with this trial, given better control of DM now. Monitor for progress and symptoms at next visit; with plan to wean off Glimepiride as appropriate.   Essential hypertension Blood pressure appreciably elevated today. Patient to begin checking and recording blood pressure every other day. She is on Losartan 25 mg daily. Addition of Jardiance for DM management, likely  to provide some additional benefit to blood pressure reduction.    F/u in one month  [place]   I was present during key history and physical and agree with above

## 2020-04-13 NOTE — Assessment & Plan Note (Signed)
Blood pressure appreciably elevated today. Patient to begin checking and recording blood pressure every other day. She is on Losartan 25 mg daily. Addition of Jardiance for DM management, likely to provide some additional benefit to blood pressure reduction.

## 2020-04-13 NOTE — Assessment & Plan Note (Signed)
Room for further improvement. Continue to increase insulin by one unit every morning the blood sugar is greater than 250. Additionally, continue Glimepiride 2 mg twice a day. Will add Jardiance 10 mg daily to regimen. While patient experienced notable urinary incontinence with Jardiance trial in the past, likely to have less significant symptoms with this trial, given better control of DM now. Monitor for progress and symptoms at next visit; with plan to wean off Glimepiride as appropriate.

## 2020-04-14 ENCOUNTER — Encounter (INDEPENDENT_AMBULATORY_CARE_PROVIDER_SITE_OTHER): Payer: Medicare Other | Admitting: Ophthalmology

## 2020-04-14 ENCOUNTER — Other Ambulatory Visit: Payer: Self-pay

## 2020-04-14 DIAGNOSIS — E113391 Type 2 diabetes mellitus with moderate nonproliferative diabetic retinopathy without macular edema, right eye: Secondary | ICD-10-CM

## 2020-04-14 DIAGNOSIS — H35033 Hypertensive retinopathy, bilateral: Secondary | ICD-10-CM | POA: Diagnosis not present

## 2020-04-14 DIAGNOSIS — E113312 Type 2 diabetes mellitus with moderate nonproliferative diabetic retinopathy with macular edema, left eye: Secondary | ICD-10-CM | POA: Diagnosis not present

## 2020-04-14 DIAGNOSIS — I10 Essential (primary) hypertension: Secondary | ICD-10-CM | POA: Diagnosis not present

## 2020-04-14 DIAGNOSIS — E11311 Type 2 diabetes mellitus with unspecified diabetic retinopathy with macular edema: Secondary | ICD-10-CM | POA: Diagnosis not present

## 2020-04-14 DIAGNOSIS — H43813 Vitreous degeneration, bilateral: Secondary | ICD-10-CM

## 2020-04-14 DIAGNOSIS — D3132 Benign neoplasm of left choroid: Secondary | ICD-10-CM

## 2020-04-15 ENCOUNTER — Telehealth: Payer: Self-pay | Admitting: Family Medicine

## 2020-04-15 NOTE — Telephone Encounter (Signed)
Called home phone NA Call Angela Cell - not able to leave VM

## 2020-04-25 ENCOUNTER — Ambulatory Visit: Payer: Medicare Other | Admitting: Pharmacist

## 2020-04-26 ENCOUNTER — Other Ambulatory Visit: Payer: Self-pay

## 2020-04-26 ENCOUNTER — Ambulatory Visit (INDEPENDENT_AMBULATORY_CARE_PROVIDER_SITE_OTHER): Payer: Medicare Other | Admitting: Pharmacist

## 2020-04-26 DIAGNOSIS — E1169 Type 2 diabetes mellitus with other specified complication: Secondary | ICD-10-CM | POA: Diagnosis not present

## 2020-04-26 DIAGNOSIS — E669 Obesity, unspecified: Secondary | ICD-10-CM | POA: Diagnosis not present

## 2020-04-26 MED ORDER — OZEMPIC (0.25 OR 0.5 MG/DOSE) 2 MG/1.5ML ~~LOC~~ SOPN: 0.2500 mg | PEN_INJECTOR | SUBCUTANEOUS | 0 refills | Status: DC

## 2020-04-26 MED ORDER — TRESIBA FLEXTOUCH 200 UNIT/ML ~~LOC~~ SOPN: 50.0000 [IU] | PEN_INJECTOR | Freq: Every day | SUBCUTANEOUS | 0 refills | Status: DC

## 2020-04-26 NOTE — Assessment & Plan Note (Signed)
Diabetes currently uncontrolled on current regimen. Medication adherence appears daily, daughter helps administer insulin. Control is suboptimal due to high level of insulin resistance. Provided samples today of new medications, Tresiba and Ozempic. First dose of Ozempic was administered in the office today by patient's daughter and education was provided regarding administration and side effects.  - Stop Basaglar insulin - Start Tresiba 50 units qAM. Increase by 1 unit daily when blood glucose >250.  - Start Ozempic (semaglutide) 0.25 mg once weekly.  -Extensively discussed pathophysiology of diabetes, recommended lifestyle interventions, dietary effects on blood sugar control -Counseled on s/sx of and management of hypoglycemia -Next A1C anticipated at next PCP visit in September.

## 2020-04-26 NOTE — Progress Notes (Signed)
S:     Chief Complaint  Patient presents with  . Medication Management    Patient arrives, ambulating slowly in good spirits accompanied by her daughter, Glenard Haring.  Presents for diabetes evaluation, education, and management. Patient was referred and last seen by Primary Care Provider on 04/12/20.   Blood sugars still in high 200s-300s despite increasing insulin by 1 unit daily when blood glucose >250. Now taking 67 units of Basaglar. They did not pick up Jardiance from the pharmacy as the price was over $400. Patient states she is not sleeping well and has some stress due to her husband waking up prior to her and making noise. Patient has had vertigo for the last two years, and she reports she can't differentiate her symptoms of vertigo from symptoms of high blood sugar. She has no documented low blood sugars.   Patient reports Diabetes was diagnosed at age 69.   Family/Social History: sister, brother, father have diabetes  Insurance coverage/medication affordability: reports they have [employer] to help afford medications, but Vania Rea was still over $400  Medication adherence reported as daily.   Current diabetes medications include: Basaglar (insulin glargine)  67 units daily (goes up by 1 unit each day when BG >250), glimepiride 2 mg BID - Did not pick up Jardiance from the pharmacy due to cost ($~$400) Current hypertension medications include: losartan 25 mg daily Current hyperlipidemia medications include: atorvastatin 40 mg  Patient denies hypoglycemic events.  Patient reported dietary habits: Eats three meals/day Breakfast: three eggs, half piece toast Lunch: banana, orange, tomato sandwich Dinner: three eggs Snacks: Ritz cracker chips, Ice cream "Drumstick", nuts Drinks: water, diet ginger ale  Patient-reported exercise habits: does not exercise due to vertigo   Patient denies nocturia (nighttime urination). She gets up either none or once during the night depending on  how much she drinks before bed.  Patient denies neuropathy (nerve pain). Reports having some pain in her legs but can feel her feet.  Patient reports visual changes - gradual decline over time. Patient reports self foot exams.     O:  Physical Exam Constitutional:      Appearance: Normal appearance.  HENT:     Nose: Rhinorrhea present.     Comments: Reports she has postnasal drip.  Neurological:     Mental Status: She is alert.    Review of Systems  Neurological:       Vertigo (patient reports this is constant)  All other systems reviewed and are negative.    Lab Results  Component Value Date   HGBA1C 14.5 (A) 02/02/2020   There were no vitals filed for this visit.  Lipid Panel     Component Value Date/Time   CHOL 206 (H) 12/12/2016 1115   TRIG 116 12/12/2016 1115   HDL 57 12/12/2016 1115   CHOLHDL 3.6 12/12/2016 1115   LDLCALC 126 (H) 12/12/2016 1115   LDLDIRECT 147 (H) 05/20/2018 1118     Home fasting blood sugars: daughter checks her blood sugar every morning before breakfast and brings log in to visit. Blood sugar this morning was 322. Has been in high 200s-300s recently.     Clinical Atherosclerotic Cardiovascular Disease (ASCVD): No  The ASCVD Risk score Mikey Bussing DC Jr., et al., 2013) failed to calculate for the following reasons:   Cannot find a previous HDL lab   Cannot find a previous total cholesterol lab    A/P: Diabetes currently uncontrolled on current regimen. Medication adherence appears daily, daughter helps administer  insulin. Control is suboptimal due to high level of insulin resistance. Provided samples today of new medications, Tresiba and Ozempic. First dose of Ozempic was administered in the office today by patient's daughter and education was provided regarding administration and side effects.  - Stop Basaglar insulin - Start Tresiba 50 units qAM. Increase by 1 unit daily when blood glucose >250.  - Start Ozempic (semaglutide) 0.25 mg once  weekly.  -Extensively discussed pathophysiology of diabetes, recommended lifestyle interventions, dietary effects on blood sugar control -Counseled on s/sx of and management of hypoglycemia -Next A1C anticipated at next PCP visit in September.    Written patient instructions provided.  Total time in face to face counseling 30 minutes.   I will follow up by phone call in a few days to see how tolerating Ozempic and to titrate [place]. Will schedule follow up visit as needed.  Follow up with PCP.  Patient seen with Rebbeca Paul, PharmD - PGY-1 Resident.

## 2020-04-26 NOTE — Patient Instructions (Signed)
Nice meeting you today!  Stop taking Basaglar.   Start taking Tresiba 50 units daily. Can increase by 1 unit each day when blood sugar is over 250.   Start taking Ozempic 0.25 mg weekly. You received the first dose in the office today, so the next dose will be due on Tuesday 9/7.   We will call you in a few days to see how you are doing.

## 2020-04-26 NOTE — Progress Notes (Signed)
Reviewed: I agree with Dr. Koval's documentation and management. 

## 2020-04-29 ENCOUNTER — Telehealth: Payer: Self-pay | Admitting: Pharmacist

## 2020-04-29 NOTE — Telephone Encounter (Signed)
Contacted patient's daughter, [name] Glenard Haring), who reports much improved blood sugar readings.   Reported Wed fasting blood glucose 207  Thursday and Today Fasting 170s  Side effect of mild nausea was reported with day #2 and #3 of semaglutide (Ozempic) dosing interval.   She was comfortable with continuing current therapy at this time.  Tresiba 50 units once daily Ozempic 0.25mg  once weekly. Plans to give next dose on Tuesday.  Counseled that mild nausea should diminish with subsequent dosing.   I requested once daily AM fasting blood sugar checks for 1 more week.  I shared that I would send communication to Dr. Erin Hearing and the patient/family should anticipate next contact with him early/mid next week.  Return to [place] communication was preferred by the patient.  Small titration to slightly higher dose of Tyler Aas may be necessary next week.  I will defer to PCP.  I am happy to help again in the future.

## 2020-05-13 ENCOUNTER — Telehealth: Payer: Self-pay

## 2020-05-13 MED ORDER — FLUCONAZOLE 150 MG PO TABS: 150.0000 mg | ORAL_TABLET | Freq: Once | ORAL | 1 refills | Status: AC

## 2020-05-13 NOTE — Telephone Encounter (Signed)
Opened in error.   Jlyn Cerros C Leronda Lewers, RN  

## 2020-05-13 NOTE — Addendum Note (Signed)
Addended by: Talbert Cage L on: 05/13/2020 12:09 PM   Modules accepted: Orders

## 2020-05-15 ENCOUNTER — Encounter: Payer: Self-pay | Admitting: Family Medicine

## 2020-05-16 ENCOUNTER — Other Ambulatory Visit: Payer: Self-pay | Admitting: Family Medicine

## 2020-05-16 ENCOUNTER — Other Ambulatory Visit: Payer: Self-pay

## 2020-05-16 MED ORDER — FLUCONAZOLE 150 MG PO TABS
150.0000 mg | ORAL_TABLET | Freq: Once | ORAL | 0 refills | Status: AC
Start: 2020-05-16 — End: 2020-05-16

## 2020-05-16 NOTE — Telephone Encounter (Signed)
Please let her know I sent it in  Thanks

## 2020-05-17 NOTE — Telephone Encounter (Signed)
Informed patient of RX.  .Grey Schlauch R Caryn Gienger, CMA  

## 2020-05-25 ENCOUNTER — Other Ambulatory Visit: Payer: Self-pay

## 2020-05-25 ENCOUNTER — Ambulatory Visit (INDEPENDENT_AMBULATORY_CARE_PROVIDER_SITE_OTHER): Payer: Medicare Other | Admitting: Family Medicine

## 2020-05-25 VITALS — BP 144/72 | HR 97 | Wt 242.0 lb

## 2020-05-25 DIAGNOSIS — E7849 Other hyperlipidemia: Secondary | ICD-10-CM

## 2020-05-25 DIAGNOSIS — E669 Obesity, unspecified: Secondary | ICD-10-CM | POA: Diagnosis not present

## 2020-05-25 DIAGNOSIS — I10 Essential (primary) hypertension: Secondary | ICD-10-CM | POA: Diagnosis not present

## 2020-05-25 DIAGNOSIS — R634 Abnormal weight loss: Secondary | ICD-10-CM | POA: Diagnosis not present

## 2020-05-25 DIAGNOSIS — E1169 Type 2 diabetes mellitus with other specified complication: Secondary | ICD-10-CM | POA: Diagnosis not present

## 2020-05-25 LAB — POCT GLYCOSYLATED HEMOGLOBIN (HGB A1C): HbA1c, POC (controlled diabetic range): 11.3 % — AB (ref 0.0–7.0)

## 2020-05-25 NOTE — Progress Notes (Signed)
    SUBJECTIVE:   CHIEF COMPLAINT / HPI:   DIABETES Disease Monitoring: Blood Sugar range-most AM fastings < 200 for last week   Medications Adherence good brings all her meds Hypoglycemic symptoms- no  HYPERTENSION Home BP Monitoring readings: most blood pressure in normal range  Chest pain- no     Medication Adherence  Brings meds. Lightheadedness-  no  Edema- no    BALANCE Improved.  No recent falls  PERTINENT  PMH / PSH: Her daughter is very involved in her care.  [facility]  OBJECTIVE:   BP (!) 144/72   Pulse 97   Wt 242 lb (109.8 kg)   SpO2 97%   BMI 36.26 kg/m   Moving around exam room without balance issues  ASSESSMENT/PLAN:   Essential hypertension Improved.  Continue current medications.  Check labs   Diabetes mellitus type 2 in obese (HCC) Improving fasting blood sugars.  Continue current medications with weaning down her glipizide to once daily.     Weight loss Improved.  Likely due to better blood sugar control.       Lind Covert, MD [place]

## 2020-05-25 NOTE — Assessment & Plan Note (Signed)
Improved.  Continue current medications.  Check labs

## 2020-05-25 NOTE — Assessment & Plan Note (Signed)
Improving fasting blood sugars.  Continue current medications with weaning down her glipizide to once daily.

## 2020-05-25 NOTE — Patient Instructions (Addendum)
Good to see you today!  Thanks for coming in.  Keep taking all the medications as you are except - Cut back on glimperide to 2mg  once in the AM only - Check your blood sugar every other day and let me know  I will call you if your tests are not good.  Otherwise, I will send you a message on MyChart (if it is active) or a letter in the mail..  If you do not hear from me with in 2 weeks please call our office.     You need a mammogram to prevent breast cancer.  Please schedule an appointment.  You can call [phone].     Come back in 3 months unless you hear from me

## 2020-05-25 NOTE — Assessment & Plan Note (Signed)
Improved.  Likely due to better blood sugar control.

## 2020-05-26 LAB — CMP14+EGFR
ALT: 77 IU/L — ABNORMAL HIGH (ref 0–32)
AST: 67 IU/L — ABNORMAL HIGH (ref 0–40)
Albumin/Globulin Ratio: 1.3 (ref 1.2–2.2)
Albumin: 3.7 g/dL — ABNORMAL LOW (ref 3.8–4.8)
Alkaline Phosphatase: 115 IU/L (ref 44–121)
BUN/Creatinine Ratio: 30 — ABNORMAL HIGH (ref 12–28)
BUN: 31 mg/dL — ABNORMAL HIGH (ref 8–27)
Bilirubin Total: 0.3 mg/dL (ref 0.0–1.2)
CO2: 21 mmol/L (ref 20–29)
Calcium: 9.6 mg/dL (ref 8.7–10.3)
Chloride: 100 mmol/L (ref 96–106)
Creatinine, Ser: 1.03 mg/dL — ABNORMAL HIGH (ref 0.57–1.00)
GFR calc Af Amer: 64 mL/min/{1.73_m2} (ref >59)
GFR calc non Af Amer: 56 mL/min/{1.73_m2} — ABNORMAL LOW (ref >59)
Globulin, Total: 2.8 g/dL (ref 1.5–4.5)
Glucose: 221 mg/dL — ABNORMAL HIGH (ref 65–99)
Potassium: 4.9 mmol/L (ref 3.5–5.2)
Sodium: 137 mmol/L (ref 134–144)
Total Protein: 6.5 g/dL (ref 6.0–8.5)

## 2020-05-26 LAB — LDL CHOLESTEROL, DIRECT: LDL Direct: 75 mg/dL (ref 0–99)

## 2020-05-29 ENCOUNTER — Other Ambulatory Visit: Payer: Self-pay | Admitting: Family Medicine

## 2020-06-02 ENCOUNTER — Other Ambulatory Visit: Payer: Self-pay | Admitting: Family Medicine

## 2020-06-02 DIAGNOSIS — E1169 Type 2 diabetes mellitus with other specified complication: Secondary | ICD-10-CM

## 2020-06-02 DIAGNOSIS — E669 Obesity, unspecified: Secondary | ICD-10-CM

## 2020-06-02 MED ORDER — TRESIBA FLEXTOUCH 200 UNIT/ML ~~LOC~~ SOPN: 50.0000 [IU] | PEN_INJECTOR | Freq: Every day | SUBCUTANEOUS | 3 refills | Status: DC

## 2020-06-08 ENCOUNTER — Other Ambulatory Visit: Payer: Self-pay | Admitting: Family Medicine

## 2020-06-16 ENCOUNTER — Encounter: Payer: Self-pay | Admitting: Family Medicine

## 2020-06-30 ENCOUNTER — Encounter (INDEPENDENT_AMBULATORY_CARE_PROVIDER_SITE_OTHER): Payer: Medicare Other | Admitting: Ophthalmology

## 2020-06-30 DIAGNOSIS — E11311 Type 2 diabetes mellitus with unspecified diabetic retinopathy with macular edema: Secondary | ICD-10-CM

## 2020-06-30 DIAGNOSIS — E113312 Type 2 diabetes mellitus with moderate nonproliferative diabetic retinopathy with macular edema, left eye: Secondary | ICD-10-CM

## 2020-06-30 DIAGNOSIS — I10 Essential (primary) hypertension: Secondary | ICD-10-CM | POA: Diagnosis not present

## 2020-06-30 DIAGNOSIS — H353132 Nonexudative age-related macular degeneration, bilateral, intermediate dry stage: Secondary | ICD-10-CM

## 2020-06-30 DIAGNOSIS — E113391 Type 2 diabetes mellitus with moderate nonproliferative diabetic retinopathy without macular edema, right eye: Secondary | ICD-10-CM

## 2020-06-30 DIAGNOSIS — D3132 Benign neoplasm of left choroid: Secondary | ICD-10-CM

## 2020-06-30 DIAGNOSIS — H35033 Hypertensive retinopathy, bilateral: Secondary | ICD-10-CM | POA: Diagnosis not present

## 2020-06-30 DIAGNOSIS — H43813 Vitreous degeneration, bilateral: Secondary | ICD-10-CM | POA: Diagnosis not present

## 2020-07-11 ENCOUNTER — Telehealth: Payer: Self-pay | Admitting: Family Medicine

## 2020-07-11 NOTE — Telephone Encounter (Signed)
Spoke with patient and daughter Both doing ok Naylani has all her medications and her blood sugar are stable Recommend an appointment to see me in Dec They will schedule

## 2020-07-12 ENCOUNTER — Telehealth: Payer: Self-pay

## 2020-07-12 NOTE — Telephone Encounter (Signed)
Received phone call on nurse line regarding patient. Patient is requesting stool softener rx. Unable to find on current medication list.   Please advise.  Talbot Grumbling, RN

## 2020-07-12 NOTE — Telephone Encounter (Signed)
Patient's daughter returns call to nurse line requesting stool softener.   Please advise  Talbot Grumbling, RN

## 2020-07-13 NOTE — Telephone Encounter (Signed)
Please let her know that there isn't a prescription stool softener I would recommend Docusate OTC  Ask her to Let me know if not working   Thanks

## 2020-07-13 NOTE — Telephone Encounter (Signed)
Spoke with pt daughter over the phone and informed her of the OTC. Jacqueline Goodman was screaming in the background saying "tell dr Erin Hearing to give me what I want. I repeated your message and daughter was understanding. Damel Querry Kennon Holter, CMA

## 2020-08-02 ENCOUNTER — Other Ambulatory Visit: Payer: Self-pay | Admitting: Family Medicine

## 2020-08-09 ENCOUNTER — Encounter: Payer: Self-pay | Admitting: Family Medicine

## 2020-08-09 DIAGNOSIS — E669 Obesity, unspecified: Secondary | ICD-10-CM

## 2020-08-09 DIAGNOSIS — E1169 Type 2 diabetes mellitus with other specified complication: Secondary | ICD-10-CM

## 2020-08-10 MED ORDER — OZEMPIC (0.25 OR 0.5 MG/DOSE) 2 MG/1.5ML ~~LOC~~ SOPN: 0.2500 mg | PEN_INJECTOR | SUBCUTANEOUS | 3 refills | Status: DC

## 2020-08-16 ENCOUNTER — Encounter: Payer: Self-pay | Admitting: Family Medicine

## 2020-08-17 ENCOUNTER — Other Ambulatory Visit: Payer: Self-pay

## 2020-08-17 ENCOUNTER — Ambulatory Visit (INDEPENDENT_AMBULATORY_CARE_PROVIDER_SITE_OTHER): Payer: Medicare Other | Admitting: Family Medicine

## 2020-08-17 ENCOUNTER — Encounter: Payer: Self-pay | Admitting: Family Medicine

## 2020-08-17 VITALS — BP 140/80 | HR 104 | Wt 251.0 lb

## 2020-08-17 DIAGNOSIS — I10 Essential (primary) hypertension: Secondary | ICD-10-CM

## 2020-08-17 DIAGNOSIS — R42 Dizziness and giddiness: Secondary | ICD-10-CM | POA: Diagnosis not present

## 2020-08-17 DIAGNOSIS — E1169 Type 2 diabetes mellitus with other specified complication: Secondary | ICD-10-CM

## 2020-08-17 DIAGNOSIS — R748 Abnormal levels of other serum enzymes: Secondary | ICD-10-CM

## 2020-08-17 DIAGNOSIS — E669 Obesity, unspecified: Secondary | ICD-10-CM | POA: Diagnosis not present

## 2020-08-17 DIAGNOSIS — E7849 Other hyperlipidemia: Secondary | ICD-10-CM

## 2020-08-17 LAB — POCT GLYCOSYLATED HEMOGLOBIN (HGB A1C): Hemoglobin A1C: 11.1 % — AB (ref 4.0–5.6)

## 2020-08-17 MED ORDER — OZEMPIC (0.25 OR 0.5 MG/DOSE) 2 MG/1.5ML ~~LOC~~ SOPN: 0.5000 mg | PEN_INJECTOR | SUBCUTANEOUS | 3 refills | Status: DC

## 2020-08-17 NOTE — Progress Notes (Signed)
    SUBJECTIVE:   CHIEF COMPLAINT / HPI:   DIABETES Did not bring medications but Glenard Haring her daughter knows them.  Has been out of Ozempic for 8 days.  Not sure of dose but think 0.25.  No low blood sugar   LIVER ENZYMES Have been mildly elevated intermittently since 2018. Neg Hep C and HIV in 2017.  No abdomen pain or jaundice   CHOLESTEROL Taking lipitor daily  BALANCE WEAKNESS Intermittent.  Sometimes can walk around without cane or walker and sometimes is weak.  No focal limb weakness or change in LOC.  They have high toilets at home     PERTINENT  PMH / PSH: Daughter  Glenard Haring administers all her medications   OBJECTIVE:   BP 140/80   Pulse (!) 104   Wt 251 lb (113.9 kg)   SpO2 96%   BMI 37.61 kg/m   In room during exam able to stand and walk around slowly without assistance Heart - Regular rate and rhythm.  No murmurs, gallops or rubs.    Lungs:  Normal respiratory effort, chest expands symmetrically. Lungs are clear to auscultation, no crackles or wheezes. Extremities:  No cyanosis, edema, or deformity noted with good range of motion of all major joints.    Patient was on toilet in BR when could not get up.  Felt legs were weak.  Needed assistance to get to [facility].  Back in room was able to stand and take steps without weakness.    ASSESSMENT/PLAN:   Essential hypertension Stable continue current medications   Diabetes mellitus type 2 in obese [facility]) Not well controlled.   Have not been checking her blood sugars.  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  Recheck today  Hyperlipidemia Tolerating lipitor.  Will continue   Vertigo Along with generalized intermittent weakness.  Seems consistent with vestibulitis and deconditioning.  No focal weakness or signs of cerebellar infarct.  Refer for Physical Therapy      Lind Covert,  [place]

## 2020-08-17 NOTE — Assessment & Plan Note (Signed)
Along with generalized intermittent weakness.  Seems consistent with vestibulitis and deconditioning.  No focal weakness or signs of cerebellar infarct.  Refer for Physical Therapy

## 2020-08-17 NOTE — Assessment & Plan Note (Signed)
Not well controlled.   Have not been checking her blood sugars.  Will increase Ozempic and Tresiba and hopefully monitor blood sugar closely - see after visit summary

## 2020-08-17 NOTE — Assessment & Plan Note (Signed)
Stable continue current medications

## 2020-08-17 NOTE — Assessment & Plan Note (Addendum)
Has been chronic mild for several years.  Recheck today  Continued high.  May be partially due to markedly elevated blood sugar and likely dehydration See phone note - askd her to send me her blood sugar.   Will need to recheck along with hepatitis labs next visit 1/11

## 2020-08-17 NOTE — Assessment & Plan Note (Signed)
Tolerating lipitor.  Will continue

## 2020-08-17 NOTE — Patient Instructions (Addendum)
Great to see you today.  Diabetes - Increase the Trisiba to 55 units daily - Increase the Ozempic to 0.5 mg once a week - Check her fasting blood sugar each morning and send to me once a week on MyChart - Call with any low blood sugars  Balance and Weakness I have put in a referral to Physical Therapy.  If you do not hear from them in 2 weeks call me.  If it becomes severe and you can not move around you need to call 911 and go to the hospital   Come back in one month

## 2020-08-18 ENCOUNTER — Telehealth: Payer: Self-pay | Admitting: Family Medicine

## 2020-08-18 DIAGNOSIS — E1169 Type 2 diabetes mellitus with other specified complication: Secondary | ICD-10-CM

## 2020-08-18 LAB — BASIC METABOLIC PANEL
BUN/Creatinine Ratio: 24 (ref 12–28)
BUN: 30 mg/dL — ABNORMAL HIGH (ref 8–27)
CO2: 19 mmol/L — ABNORMAL LOW (ref 20–29)
Calcium: 9 mg/dL (ref 8.7–10.3)
Chloride: 96 mmol/L (ref 96–106)
Creatinine, Ser: 1.24 mg/dL — ABNORMAL HIGH (ref 0.57–1.00)
GFR calc Af Amer: 51 mL/min/{1.73_m2} — ABNORMAL LOW (ref >59)
GFR calc non Af Amer: 44 mL/min/{1.73_m2} — ABNORMAL LOW (ref >59)
Glucose: 426 mg/dL — ABNORMAL HIGH (ref 65–99)
Potassium: 4.4 mmol/L (ref 3.5–5.2)
Sodium: 135 mmol/L (ref 134–144)

## 2020-08-18 LAB — HEPATIC FUNCTION PANEL
ALT: 102 IU/L — ABNORMAL HIGH (ref 0–32)
AST: 61 IU/L — ABNORMAL HIGH (ref 0–40)
Albumin: 3.5 g/dL — ABNORMAL LOW (ref 3.8–4.8)
Alkaline Phosphatase: 137 IU/L — ABNORMAL HIGH (ref 44–121)
Bilirubin Total: 0.3 mg/dL (ref 0.0–1.2)
Bilirubin, Direct: <0.1 mg/dL (ref 0.00–0.40)
Total Protein: 6.4 g/dL (ref 6.0–8.5)

## 2020-08-18 NOTE — Telephone Encounter (Signed)
Spoke with her Feeling good this morning, walking ok no nausea and vomiting  Sounds happy and coherent Glenard Haring not at home now  She is unsure if got ozempic or if increased [place] dose this AM Did not take her blood sugar this morning  Told her: Her blood sugar and liver tests were high.  Vital that she take all her medications and MyChart me her fasting blood sugar readings  That if she feels worse especially nausea and vomiting or inability to walk then needs to go to the hospital  She agrees

## 2020-08-25 ENCOUNTER — Telehealth: Payer: Self-pay

## 2020-08-25 NOTE — Telephone Encounter (Signed)
Hi Kelly!   I believe this patient would qualify as they have medicare, correct? I can call the patient next week and see if they would be able to come in and sign the form and I can complete it over the phone with them. I will check with Cindee Lame but I believe we have plenty of samples I can provide.   Thanks! Shawntel Farnworth

## 2020-08-25 NOTE — Telephone Encounter (Signed)
Patient's daughter calls nurse line with concerns for cost of ozempic. Daughter reports that this medication will cost $300 out of pocket. Daughter is requesting to speak with provider regarding cost vs benefit of this medication.   I am including pharmacy team to see if patient would qualify for any patient assistance programs.   Veronda Prude, RN

## 2020-08-26 NOTE — Telephone Encounter (Signed)
Yes, she would qualify.  [employer] does require proof of income, so make sure to ask for 2020 taxes or social security/retirement/widow's/etc benefits letter.  A recent bank statement with the treasury deposit amount listed is acceptable too.  If she doesn't have any proof of income, then [employer] Oka is the next best option because they do not require it-[employer]

## 2020-08-30 NOTE — Telephone Encounter (Signed)
Began filling out PAP for Ozempic on phone with patient's daughter. Instructed her on items we need her to bring in. She is going to come in on Monday to sign papers and bring in necessary documents. Will provide sample at that time and then paperwork should hopefully process.

## 2020-09-05 NOTE — Telephone Encounter (Signed)
Unfortunately no, she did not come to the clinic today. I did see she had an appt with you tomorrow and was hoping she could fill out the paperwork while here. I was going to call tomorrow AM before appt to remind them to bring in requested paperwork. I will see if they can come early for appt and fill that out before seeing you. I will not be in [facility] tomorrow but Jacqueline Goodman will and I will let her know!   Thanks for reaching out!

## 2020-09-05 NOTE — Telephone Encounter (Signed)
Rachelle  Did her daughter follow up?  Thanks much  [employer]

## 2020-09-06 ENCOUNTER — Encounter: Payer: Self-pay | Admitting: Family Medicine

## 2020-09-06 ENCOUNTER — Ambulatory Visit (INDEPENDENT_AMBULATORY_CARE_PROVIDER_SITE_OTHER): Payer: Medicare Other | Admitting: Family Medicine

## 2020-09-06 ENCOUNTER — Other Ambulatory Visit: Payer: Self-pay

## 2020-09-06 DIAGNOSIS — E1169 Type 2 diabetes mellitus with other specified complication: Secondary | ICD-10-CM | POA: Diagnosis not present

## 2020-09-06 DIAGNOSIS — E7849 Other hyperlipidemia: Secondary | ICD-10-CM | POA: Diagnosis not present

## 2020-09-06 DIAGNOSIS — R42 Dizziness and giddiness: Secondary | ICD-10-CM

## 2020-09-06 DIAGNOSIS — R748 Abnormal levels of other serum enzymes: Secondary | ICD-10-CM

## 2020-09-06 DIAGNOSIS — E669 Obesity, unspecified: Secondary | ICD-10-CM

## 2020-09-06 NOTE — Assessment & Plan Note (Signed)
Unsure of cause but are stable.  Recheck and check hepatitis labs.  Could be combination of hyperglycemia and statin.  Monitor

## 2020-09-06 NOTE — Assessment & Plan Note (Signed)
Along with deconditioning.  Very hard to get Physical Therapy involved in midst of covid surge.  Strongly recommend using walker or cane at all times

## 2020-09-06 NOTE — Patient Instructions (Addendum)
Good to see you today!  Thanks for coming in.  Increase Tresiba to 60 u daily.  We may be able to decrease once on Ozempic.  Call me if any fasting blood sugar are less than 150.     We will be in touch about the Ozempic  I will call you if your tests are not good.  Otherwise, I will send you a message on MyChart (if it is active) or a letter in the mail..  If you do not hear from me with in 2 weeks please call our office.     Cut back on your portions - one piece of bacon in AM, half a cup of yogurt   Use a walker or a cane ALL THE TIME.   I will check on the Physical Therapy   Come back 2 months

## 2020-09-06 NOTE — Progress Notes (Signed)
    SUBJECTIVE:   CHIEF COMPLAINT / HPI:   Diabetes mellitus type 2 in obese ([place]) Not able to afford ozempic.  Working on Freight forwarder with pharmacy.   Brought in paperwork today.  List of blood sugar in media.  Lowest fasting is 230.  Elevated liver enzymes No abdomen pain.  No jaundice  Vertigo This continues.  Has a walker and cane at home but is not using.  No falls.  Holds on to her daughter and walls to get around.  No focal weakness.  Has not heard from Physical Therapy   PERTINENT  PMH / PSH: her daughter is very involved in her care  OBJECTIVE:   BP (!) 150/80   Pulse (!) 109   Wt 250 lb (113.4 kg)   SpO2 90%   BMI 37.46 kg/m   Able to walk around room and from waiting room When went to BR was not able to rise from toilet.  Had to be helped up but then could walk on own   ASSESSMENT/PLAN:   Diabetes mellitus type 2 in obese ([facility]) Poorly controlled.  Increase Tyler Aas.  Hopefull will be back on ozempic soon.   Discussed diet and cutting back on breakfast and lunch portions  Elevated liver enzymes Unsure of cause but are stable.  Recheck and check hepatitis labs.  Could be combination of hyperglycemia and statin.  Monitor   Vertigo Along with deconditioning.  Very hard to get Physical Therapy involved in midst of covid surge.  Strongly recommend using walker or cane at all times      Lind Covert, MD [place]

## 2020-09-06 NOTE — Assessment & Plan Note (Signed)
Poorly controlled.  Increase Jacqueline Goodman.  Hopefull will be back on ozempic soon.   Discussed diet and cutting back on breakfast and lunch portions

## 2020-09-06 NOTE — Telephone Encounter (Signed)
I filled out the PAP with information provided by Fanny Dance and her daughter. Patient is expected to bring in requested documents to appointment today. Discussed with Dr. Erin Hearing and he is to help patient finish completing PAP in clinic. When I return to clinic tomorrow I will verify patient provided proper documentation and if application is complete I will fax to [employer].  Of note, patient has not taken Ozempic for past 3 weeks due to being out of medication. May want to consider having patient re-start Ozempic at 0.25mg  and then assuming PAP is approved she can begin 0.5mg  when she receives her medication.  I pulled and left aside sample for patient to pick up during her appointment today. I relayed this information to Dr. Erin Hearing.   Drug name: Semaglutide (Ozempic)     Strength: 2mg /1.34mL Qty: 1 pen LOT: [id number]  Exp.Date: 08/2022  Dosing instructions: Inject 0.5mg  into the skin once a week  The patient has been instructed regarding the correct time, dose, and frequency of taking this medication, including desired effects and most common side effects.   Hughes Better 11:01 AM 09/06/2020

## 2020-09-07 LAB — HEPB+HEPC+HIV PANEL
HIV Screen 4th Generation wRfx: NONREACTIVE
Hep B C IgM: NEGATIVE
Hep B Core Total Ab: NEGATIVE
Hep B E Ab: NEGATIVE
Hep B E Ag: NEGATIVE
Hep B Surface Ab, Qual: NONREACTIVE
Hep C Virus Ab: <0.1 s/co ratio (ref 0.0–0.9)
Hepatitis B Surface Ag: NEGATIVE

## 2020-09-07 LAB — HEPATIC FUNCTION PANEL
ALT: 71 IU/L — ABNORMAL HIGH (ref 0–32)
AST: 57 IU/L — ABNORMAL HIGH (ref 0–40)
Albumin: 3.8 g/dL (ref 3.8–4.8)
Alkaline Phosphatase: 127 IU/L — ABNORMAL HIGH (ref 44–121)
Bilirubin Total: 0.3 mg/dL (ref 0.0–1.2)
Bilirubin, Direct: 0.11 mg/dL (ref 0.00–0.40)
Total Protein: 6.6 g/dL (ref 6.0–8.5)

## 2020-09-07 NOTE — Telephone Encounter (Signed)
Patient's daughter presented today to pick up sample medication and I provided her with the sample.   I received all of the necessary documents from the patient to process her PAP to [employer]. Of note, I included Tresiba and pen needles on the application in addition to the Ozempic. Application was faxed to company today.

## 2020-09-21 ENCOUNTER — Encounter (INDEPENDENT_AMBULATORY_CARE_PROVIDER_SITE_OTHER): Payer: Medicare Other | Admitting: Ophthalmology

## 2020-09-22 ENCOUNTER — Telehealth: Payer: Self-pay

## 2020-09-22 NOTE — Telephone Encounter (Signed)
Pt's daughter called requesting a call back right away to discuss her mother falling again now twice this week. She wants to get help with someone coming into the home to assist her with her mom.

## 2020-09-22 NOTE — Telephone Encounter (Signed)
Spoke with patient's daughter, Jacqueline Goodman. Reports that patient has been having increased weakness and decreased appetite since 1/25. Reports that patient also fell late on Tuesday evening. Patient did not hit head or have LOC. Daughter states that Jacqueline Goodman has not been eating any solids over the last two days. Only intake has been sips of water and ensures. Blood sugar levels have ranged from 194-366.   Denies fever or other sick symptoms. However, reports that mother was "chilled" last night.   Spoke with supervising doctor [facility]) regarding patient condition. Dr. Thompson Grayer is recommending that patient be evaluated by provider today. Daughter reports that she is not able to safely transport patient to office due to patient's increased weakness and "legs giving out". Daughter is going to call EMS to transport patient to hospital for further evaluation.   To PCP  Talbot Grumbling, RN

## 2020-09-23 ENCOUNTER — Emergency Department (HOSPITAL_COMMUNITY): Payer: Medicare Other

## 2020-09-23 ENCOUNTER — Other Ambulatory Visit: Payer: Self-pay

## 2020-09-23 ENCOUNTER — Inpatient Hospital Stay (HOSPITAL_COMMUNITY)
Admission: EM | Admit: 2020-09-23 | Discharge: 2020-09-28 | DRG: 178 | Disposition: A | Discharge status: Skilled Nursing Facility | Payer: Medicare Other | Attending: Family Medicine | Admitting: Family Medicine

## 2020-09-23 ENCOUNTER — Encounter (HOSPITAL_COMMUNITY): Payer: Self-pay

## 2020-09-23 DIAGNOSIS — Z888 Allergy status to other drugs, medicaments and biological substances status: Secondary | ICD-10-CM

## 2020-09-23 DIAGNOSIS — Z803 Family history of malignant neoplasm of breast: Secondary | ICD-10-CM

## 2020-09-23 DIAGNOSIS — E785 Hyperlipidemia, unspecified: Secondary | ICD-10-CM | POA: Diagnosis present

## 2020-09-23 DIAGNOSIS — R296 Repeated falls: Secondary | ICD-10-CM | POA: Diagnosis present

## 2020-09-23 DIAGNOSIS — F32A Depression, unspecified: Secondary | ICD-10-CM | POA: Diagnosis present

## 2020-09-23 DIAGNOSIS — Z6837 Body mass index (BMI) 37.0-37.9, adult: Secondary | ICD-10-CM

## 2020-09-23 DIAGNOSIS — E1169 Type 2 diabetes mellitus with other specified complication: Secondary | ICD-10-CM

## 2020-09-23 DIAGNOSIS — R627 Adult failure to thrive: Secondary | ICD-10-CM

## 2020-09-23 DIAGNOSIS — U071 COVID-19: Secondary | ICD-10-CM | POA: Diagnosis not present

## 2020-09-23 DIAGNOSIS — Z8249 Family history of ischemic heart disease and other diseases of the circulatory system: Secondary | ICD-10-CM

## 2020-09-23 DIAGNOSIS — Z79899 Other long term (current) drug therapy: Secondary | ICD-10-CM

## 2020-09-23 DIAGNOSIS — F0391 Unspecified dementia with behavioral disturbance: Secondary | ICD-10-CM

## 2020-09-23 DIAGNOSIS — R410 Disorientation, unspecified: Secondary | ICD-10-CM | POA: Diagnosis not present

## 2020-09-23 DIAGNOSIS — R531 Weakness: Secondary | ICD-10-CM | POA: Diagnosis not present

## 2020-09-23 DIAGNOSIS — E46 Unspecified protein-calorie malnutrition: Secondary | ICD-10-CM | POA: Diagnosis not present

## 2020-09-23 DIAGNOSIS — E11319 Type 2 diabetes mellitus with unspecified diabetic retinopathy without macular edema: Secondary | ICD-10-CM | POA: Diagnosis present

## 2020-09-23 DIAGNOSIS — R0902 Hypoxemia: Secondary | ICD-10-CM | POA: Diagnosis not present

## 2020-09-23 DIAGNOSIS — I4510 Unspecified right bundle-branch block: Secondary | ICD-10-CM | POA: Diagnosis not present

## 2020-09-23 DIAGNOSIS — Z833 Family history of diabetes mellitus: Secondary | ICD-10-CM

## 2020-09-23 DIAGNOSIS — R0602 Shortness of breath: Secondary | ICD-10-CM | POA: Diagnosis not present

## 2020-09-23 DIAGNOSIS — Z885 Allergy status to narcotic agent status: Secondary | ICD-10-CM

## 2020-09-23 DIAGNOSIS — G9341 Metabolic encephalopathy: Secondary | ICD-10-CM

## 2020-09-23 DIAGNOSIS — Z794 Long term (current) use of insulin: Secondary | ICD-10-CM

## 2020-09-23 DIAGNOSIS — I10 Essential (primary) hypertension: Secondary | ICD-10-CM | POA: Diagnosis present

## 2020-09-23 DIAGNOSIS — N179 Acute kidney failure, unspecified: Secondary | ICD-10-CM | POA: Diagnosis not present

## 2020-09-23 DIAGNOSIS — Z811 Family history of alcohol abuse and dependence: Secondary | ICD-10-CM

## 2020-09-23 DIAGNOSIS — E860 Dehydration: Secondary | ICD-10-CM | POA: Diagnosis present

## 2020-09-23 DIAGNOSIS — F0151 Vascular dementia with behavioral disturbance: Secondary | ICD-10-CM | POA: Diagnosis present

## 2020-09-23 DIAGNOSIS — E1165 Type 2 diabetes mellitus with hyperglycemia: Secondary | ICD-10-CM | POA: Diagnosis not present

## 2020-09-23 DIAGNOSIS — F339 Major depressive disorder, recurrent, unspecified: Secondary | ICD-10-CM

## 2020-09-23 DIAGNOSIS — F03918 Unspecified dementia, unspecified severity, with other behavioral disturbance: Secondary | ICD-10-CM

## 2020-09-23 LAB — CBG MONITORING, ED: Glucose-Capillary: 213 mg/dL — ABNORMAL HIGH (ref 70–99)

## 2020-09-23 LAB — CBC WITH DIFFERENTIAL/PLATELET
Abs Immature Granulocytes: 0.04 10*3/uL (ref 0.00–0.07)
Basophils Absolute: 0 10*3/uL (ref 0.0–0.1)
Basophils Relative: 0 %
Eosinophils Absolute: 0 10*3/uL (ref 0.0–0.5)
Eosinophils Relative: 0 %
HCT: 39.4 % (ref 36.0–46.0)
Hemoglobin: 13.3 g/dL (ref 12.0–15.0)
Immature Granulocytes: 1 %
Lymphocytes Relative: 14 %
Lymphs Abs: 0.9 10*3/uL (ref 0.7–4.0)
MCH: 28.7 pg (ref 26.0–34.0)
MCHC: 33.8 g/dL (ref 30.0–36.0)
MCV: 85.1 fL (ref 80.0–100.0)
Monocytes Absolute: 0.5 10*3/uL (ref 0.1–1.0)
Monocytes Relative: 7 %
Neutro Abs: 5.1 10*3/uL (ref 1.7–7.7)
Neutrophils Relative %: 78 %
Platelets: 192 10*3/uL (ref 150–400)
RBC: 4.63 MIL/uL (ref 3.87–5.11)
RDW: 12.2 % (ref 11.5–15.5)
WBC: 6.5 10*3/uL (ref 4.0–10.5)
nRBC: 0 % (ref 0.0–0.2)

## 2020-09-23 LAB — FERRITIN: Ferritin: 372 ng/mL — ABNORMAL HIGH (ref 11–307)

## 2020-09-23 LAB — COMPREHENSIVE METABOLIC PANEL
ALT: 58 U/L — ABNORMAL HIGH (ref 0–44)
AST: 73 U/L — ABNORMAL HIGH (ref 15–41)
Albumin: 2.7 g/dL — ABNORMAL LOW (ref 3.5–5.0)
Alkaline Phosphatase: 71 U/L (ref 38–126)
Anion gap: 10 (ref 5–15)
BUN: 51 mg/dL — ABNORMAL HIGH (ref 8–23)
CO2: 24 mmol/L (ref 22–32)
Calcium: 8.5 mg/dL — ABNORMAL LOW (ref 8.9–10.3)
Chloride: 102 mmol/L (ref 98–111)
Creatinine, Ser: 1.35 mg/dL — ABNORMAL HIGH (ref 0.44–1.00)
GFR, Estimated: 43 mL/min — ABNORMAL LOW (ref >60)
Glucose, Bld: 269 mg/dL — ABNORMAL HIGH (ref 70–99)
Potassium: 4.4 mmol/L (ref 3.5–5.1)
Sodium: 136 mmol/L (ref 135–145)
Total Bilirubin: 0.6 mg/dL (ref 0.3–1.2)
Total Protein: 6.2 g/dL — ABNORMAL LOW (ref 6.5–8.1)

## 2020-09-23 LAB — I-STAT VENOUS BLOOD GAS, ED
Acid-Base Excess: 2 mmol/L (ref 0.0–2.0)
Bicarbonate: 26.4 mmol/L (ref 20.0–28.0)
Calcium, Ion: 0.97 mmol/L — ABNORMAL LOW (ref 1.15–1.40)
HCT: 39 % (ref 36.0–46.0)
Hemoglobin: 13.3 g/dL (ref 12.0–15.0)
O2 Saturation: 98 %
Potassium: 6.8 mmol/L (ref 3.5–5.1)
Sodium: 132 mmol/L — ABNORMAL LOW (ref 135–145)
TCO2: 28 mmol/L (ref 22–32)
pCO2, Ven: 39 mmHg — ABNORMAL LOW (ref 44.0–60.0)
pH, Ven: 7.439 — ABNORMAL HIGH (ref 7.250–7.430)
pO2, Ven: 100 mmHg — ABNORMAL HIGH (ref 32.0–45.0)

## 2020-09-23 LAB — D-DIMER, QUANTITATIVE: D-Dimer, Quant: 2.45 ug/mL-FEU — ABNORMAL HIGH (ref 0.00–0.50)

## 2020-09-23 LAB — LACTATE DEHYDROGENASE: LDH: 307 U/L — ABNORMAL HIGH (ref 98–192)

## 2020-09-23 LAB — SARS CORONAVIRUS 2 BY RT PCR (HOSPITAL ORDER, PERFORMED IN ~~LOC~~ HOSPITAL LAB): SARS Coronavirus 2: POSITIVE — AB

## 2020-09-23 LAB — FIBRINOGEN: Fibrinogen: 487 mg/dL — ABNORMAL HIGH (ref 210–475)

## 2020-09-23 LAB — C-REACTIVE PROTEIN: CRP: 0.8 mg/dL (ref <1.0)

## 2020-09-23 LAB — TRIGLYCERIDES: Triglycerides: 151 mg/dL — ABNORMAL HIGH (ref <150)

## 2020-09-23 LAB — LACTIC ACID, PLASMA: Lactic Acid, Venous: 1.6 mmol/L (ref 0.5–1.9)

## 2020-09-23 LAB — PROCALCITONIN: Procalcitonin: 0.25 ng/mL

## 2020-09-23 MED ORDER — INSULIN GLARGINE 100 UNIT/ML ~~LOC~~ SOLN
20.0000 [IU] | Freq: Every day | SUBCUTANEOUS | Status: DC
  Administered 2020-09-24 – 2020-09-28 (×5): 20 [IU] via SUBCUTANEOUS
  Filled 2020-09-23 (×5): qty 0.2

## 2020-09-23 MED ORDER — SODIUM CHLORIDE 0.9 % IV SOLN
100.0000 mg | Freq: Every day | INTRAVENOUS | Status: AC
  Administered 2020-09-24 – 2020-09-27 (×4): 100 mg via INTRAVENOUS
  Filled 2020-09-23 (×2): qty 20
  Filled 2020-09-23: qty 100
  Filled 2020-09-23: qty 20

## 2020-09-23 MED ORDER — INSULIN ASPART 100 UNIT/ML ~~LOC~~ SOLN
0.0000 [IU] | SUBCUTANEOUS | Status: DC
  Administered 2020-09-23: 3 [IU] via SUBCUTANEOUS
  Administered 2020-09-24 (×5): 2 [IU] via SUBCUTANEOUS
  Administered 2020-09-25: 3 [IU] via SUBCUTANEOUS
  Administered 2020-09-25: 1 [IU] via SUBCUTANEOUS
  Administered 2020-09-25: 5 [IU] via SUBCUTANEOUS
  Administered 2020-09-25: 7 [IU] via SUBCUTANEOUS
  Administered 2020-09-25 (×2): 3 [IU] via SUBCUTANEOUS
  Administered 2020-09-26 (×2): 2 [IU] via SUBCUTANEOUS
  Administered 2020-09-26: 7 [IU] via SUBCUTANEOUS
  Administered 2020-09-27 (×2): 3 [IU] via SUBCUTANEOUS
  Administered 2020-09-27: 5 [IU] via SUBCUTANEOUS
  Administered 2020-09-27 (×2): 2 [IU] via SUBCUTANEOUS
  Administered 2020-09-28: 7 [IU] via SUBCUTANEOUS
  Administered 2020-09-28: 5 [IU] via SUBCUTANEOUS
  Administered 2020-09-28: 1 [IU] via SUBCUTANEOUS
  Administered 2020-09-28: 3 [IU] via SUBCUTANEOUS
  Administered 2020-09-28: 5 [IU] via SUBCUTANEOUS
  Administered 2020-09-28: 3 [IU] via SUBCUTANEOUS

## 2020-09-23 MED ORDER — SODIUM CHLORIDE 0.9 % IV SOLN: Freq: Once | INTRAVENOUS | Status: AC

## 2020-09-23 MED ORDER — ENOXAPARIN SODIUM 40 MG/0.4ML ~~LOC~~ SOLN
40.0000 mg | SUBCUTANEOUS | Status: DC
  Administered 2020-09-23: 40 mg via SUBCUTANEOUS
  Filled 2020-09-23: qty 0.4

## 2020-09-23 MED ORDER — SODIUM CHLORIDE 0.9 % IV BOLUS
1000.0000 mL | Freq: Once | INTRAVENOUS | Status: AC
  Administered 2020-09-23: 1000 mL via INTRAVENOUS

## 2020-09-23 MED ORDER — ACETAMINOPHEN 325 MG PO TABS
650.0000 mg | ORAL_TABLET | Freq: Four times a day (QID) | ORAL | Status: DC | PRN
  Administered 2020-09-26 (×3): 650 mg via ORAL
  Filled 2020-09-23 (×2): qty 2

## 2020-09-23 MED ORDER — INSULIN GLARGINE 100 UNIT/ML ~~LOC~~ SOLN
8.0000 [IU] | Freq: Once | SUBCUTANEOUS | Status: DC
  Filled 2020-09-23 (×2): qty 0.08

## 2020-09-23 MED ORDER — POLYETHYLENE GLYCOL 3350 17 G PO PACK
17.0000 g | PACK | Freq: Every day | ORAL | Status: DC | PRN
Start: 2020-09-23 — End: 2020-09-29

## 2020-09-23 MED ORDER — INSULIN ASPART 100 UNIT/ML ~~LOC~~ SOLN: 0.0000 [IU] | Freq: Three times a day (TID) | SUBCUTANEOUS | Status: DC

## 2020-09-23 MED ORDER — SODIUM CHLORIDE 0.9 % IV SOLN
200.0000 mg | Freq: Once | INTRAVENOUS | Status: AC
  Administered 2020-09-23: 200 mg via INTRAVENOUS
  Filled 2020-09-23: qty 40

## 2020-09-23 MED ORDER — INSULIN DEGLUDEC 200 UNIT/ML ~~LOC~~ SOPN: 20.0000 [IU] | PEN_INJECTOR | Freq: Every day | SUBCUTANEOUS | Status: DC

## 2020-09-23 MED ORDER — ATORVASTATIN CALCIUM 40 MG PO TABS
40.0000 mg | ORAL_TABLET | Freq: Every day | ORAL | Status: DC
  Administered 2020-09-23 – 2020-09-28 (×6): 40 mg via ORAL
  Filled 2020-09-23 (×6): qty 1

## 2020-09-23 NOTE — ED Triage Notes (Signed)
Pt BIB [facility] from home c/o failure to thrive. Per family pt is normally confused and requires help but family felt like pt is requiring more help  Over the last week. Per family pt has not been eating since Monday and pt is now extremely weak. Pt normally able to help herself sit up and now is unable to do that. Pt denies any pain but screams every time you touch her or move her. Pt is alert and able to answer all orientation questions appropriately.

## 2020-09-23 NOTE — ED Notes (Signed)
Needs 2nd set of BC's. Lab has attempted. Pt difficult stick.

## 2020-09-23 NOTE — ED Provider Notes (Signed)
[place] EMERGENCY DEPARTMENT Provider Note   CSN: [id number] Arrival date & time: 09/23/20  1311     History Chief Complaint  Patient presents with  . Failure To Thrive    Jacqueline Goodman is a 70 y.o. female.  HPI   70 year old female with past medical history of HTN, HLD, DM presents the emergency department with reported weakness.  Patient lives with the daughter, she is currently not at bedside.  Patient is otherwise a very poor historian.  Report is that patient has been having decreased appetite and weakness over the last week.  No mentioned fever, daughter was unable to care for the patient or get her into her car herself so EMS was called.  Past Medical History:  Diagnosis Date  . Diabetes mellitus without complication ([place])   . Hemorrhoids   . Hypertension   . Pneumonia    as a child  . PONV (postoperative nausea and vomiting)   . Wound infection after surgery 08/2017    Patient Active Problem List   Diagnosis Date Noted  . Elevated liver enzymes 08/17/2020  . Weight loss 03/15/2020  . Vertigo 05/12/2019  . Obesity 12/25/2017  . Pain in joint, ankle and foot 11/13/2017  . 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 Situational anxiety 03/13/2017  . Hyperlipidemia 07/05/2016  . Essential hypertension 06/28/2016  . Diabetes mellitus type 2 in obese ([place]) 06/07/2016    Past Surgical History:  Procedure Laterality Date  . ABDOMINAL HYSTERECTOMY     PARTIAL  . ABDOMINOPLASTY  07/2017   tummy tuck  . CHOLECYSTECTOMY    . COLONOSCOPY    . DEBRIDEMENT AND CLOSURE WOUND N/A 09/02/2017   Procedure: WOUND DEBRIDEMENT;  Surgeon: Youlanda Roys, MD;  Location: [place];  Service: Plastics;  Laterality: N/A;  . INCISION AND DRAINAGE OF WOUND N/A 08/15/2017   Procedure: Ananias Pilgrim AND DEBRIDEMENT OF ABDOMINAL WOUND;  Surgeon: Youlanda Roys, MD;  Location: [place];  Service: Plastics;  Laterality: N/A;  SPINAL     OB History   No obstetric  history on file.     Family History  Problem Relation Age of Onset  . Cancer Mother   . Breast cancer Mother   . Heart disease Father   . Alcohol abuse Father   . Diabetes Father   . Alcohol abuse Sister   . Heart disease Sister     Social History   Tobacco Use  . Smoking status: Never Smoker  . Smokeless tobacco: Never Used  Vaping Use  . Vaping Use: Never used  Substance Use Topics  . Alcohol use: No  . Drug use: No    Home Medications Prior to Admission medications   Medication Sig Start Date End Date Taking? Authorizing Provider  atorvastatin (LIPITOR) 40 MG tablet TAKE 1 TABLET BY MOUTH EVERY DAY 03/07/20   Chambliss, Jeb Levering, MD  BESIVANCE 0.6 % SUSP Apply 1 drop to eye 3 (three) times daily. 04/15/20   [provider]  glimepiride (AMARYL) 2 MG tablet Take 1 tablet (2 mg total) by mouth daily with breakfast. 05/30/20   Chambliss, Jeb Levering, MD  insulin degludec (TRESIBA FLEXTOUCH) 200 UNIT/ML FlexTouch Pen Inject 60 Units into the skin daily. 08/18/20   Lind Covert, MD  Insulin Pen Needle (BD PEN NEEDLE NANO 2ND GEN) 32G X 4 MM MISC Use as directed once daily.  May substitute for needle that fits Kara Mead 03/09/20   Lind Covert, MD  Insulin  Syringe-Needle U-100 (B-D INS SYR ULTRAFINE .3CC/31G) 31G X 5/16" 0.3 ML MISC Use as directed 03/07/20   Lind Covert, MD  losartan (COZAAR) 25 MG tablet TAKE 1 TABLET BY MOUTH EVERY DAY 08/02/20   Chambliss, Jeb Levering, MD  ONETOUCH ULTRA test strip USE TO TEST BLOOD SUGAR ONCE EVERY MORNING BEFORE EATING. ICD-10 CODE: E11.9 03/11/20   Lind Covert, MD  Semaglutide,0.25 or 0.5MG /DOS, (OZEMPIC, 0.25 OR 0.5 MG/DOSE,) 2 MG/1.5ML SOPN Inject 0.5 mg into the skin once a week. Patient not taking: Reported on 09/06/2020 08/17/20   Lind Covert, MD    Allergies    Morphine and related and Tape  Review of Systems   Review of Systems  Unable to perform ROS: Mental status  change    Physical Exam Updated Vital Signs BP (!) 135/97 (BP Location: Right Arm)   Pulse 85   Temp 98.5 F (36.9 C) (Oral)   Resp 19   SpO2 100%   Physical Exam Vitals and nursing note reviewed.  Constitutional:      Appearance: Normal appearance. She is not toxic-appearing.  HENT:     Head: Normocephalic.     Mouth/Throat:     Mouth: Mucous membranes are moist.  Eyes:     Pupils: Pupils are equal, round, and reactive to light.  Cardiovascular:     Rate and Rhythm: Normal rate.  Pulmonary:     Effort: Pulmonary effort is normal. No respiratory distress.  Abdominal:     Palpations: Abdomen is soft.     Tenderness: There is no abdominal tenderness.  Musculoskeletal:        General: No swelling or deformity.  Skin:    General: Skin is warm.  Neurological:     Mental Status: She is alert. She is disoriented.     Comments: confused     ED Results / Procedures / Treatments   Labs (all labs ordered are listed, but only abnormal results are displayed) Labs Reviewed  SARS CORONAVIRUS 2 BY RT PCR (HOSPITAL ORDER, [facility])  CBC WITH DIFFERENTIAL/PLATELET  COMPREHENSIVE METABOLIC PANEL  URINALYSIS, ROUTINE W REFLEX MICROSCOPIC  BLOOD GAS, VENOUS    EKG None  Radiology No results found.  Procedures Procedures   Medications Ordered in ED Medications - No data to display  ED Course  I have reviewed the triage vital signs and the nursing notes.  Pertinent labs & imaging results that were available during my care of the patient were reviewed by me and considered in my medical decision making (see chart for details).    MDM Rules/Calculators/A&P                          70 year old female presents the emergency department with reported weakness and decreased appetite.  Patient seems disoriented and confused.  Vitals are stable, daughter is not at bedside.  Have not been able to reach her by phone.  Patient is otherwise a very poor  and minimal historian.  Patient pending lab evaluation, patient signed out to oncoming provider.  Final Clinical Impression(s) / ED Diagnoses Final diagnoses:  None    Rx / DC Orders ED Discharge Orders    None       Lorelle Gibbs, DO 09/23/20 1636

## 2020-09-23 NOTE — H&P (Addendum)
[facility] Admission History and Physical Service Pager: [phone]  Patient name: Jacqueline Goodman Medical record number: [id number] Date of birth: 11-Mar-1951 Age: 70 y.o. Gender: female  Primary Care Provider: Lind Covert, MD Consultants: None Code Status: Full which was confirmed with family if patient unable to confirm daughter [name]   Preferred Emergency Contact: Clarita Leber [phone] [facility])  Chief Complaint: Fatigue and weakness  Assessment and Plan: Jacqueline Goodman is a 70 y.o. female presenting with failure to thrive and found to be COVID-19 positive. PMH is significant for hyperlipidemia, hypertension, type 2 diabetes  Failure to thrive  weakness  fatigue: Patient is a 70 year old female brought to the emergency department today due to concerns of failure to thrive by the family.  The daughter had expressed concerns over the patient having decreased appetite and increased weakness over the last week and reached out to the [facility] clinic for advice and was recommended to have her evaluated in the emergency department. Has had decreased appetite since 1/19, increased fatigue for past 4 days and had confusion over past 2 days.  VSS.  Shivering on exam.  Lungs CTAB. Satting 99% on RA. Oriented to person, not place or time, able to state who President is, follows commands appropriately.  Chest X-Ray noormal.  Patient positive for COVID.  Main concern of dehydration, given decreased PO intake, and elevated Creatinine.  Thought likely to be due to COVID-19 infection.  Less likelly bacterial infection given normal Chest X-Ray and normal WBC, though will obtain UA, UCx, and BCx to rule out. Less likely to be stroke given normal neurologic exam outside of AMS. Will check TSH given fatigue.  Hgb normal at 13.3.  Will obtain TSH given fatigue and weakness to rule out Hypothyroidism.  Plan to rehydrate with IV fluids and monitor mental status and energy level.   Patient does have a normal neurologic exam with no focal deficits, though she is only oriented to self and president, not place or year.  At baseline she is A&O x3.  I have low suspicion for stroke or intracranial abnormality as the cause of her weakness and fatigue particular with generalized weakness and no focal findings.  Can consider additional imaging should the patient's physical exam findings change.  Can consider additional infectious etiologies including possible skin infection as we were unable to visualize her entire body to look for sacral ulcers or other potential sources of infection, I feel this may be less likely due to a normal white blood cell count. -Admit to FPTS, Med-Surg, attending Dr. Andria Frames -Give 1000 mL bolus, followed by mIVF NS -Blood cultures -Follow-up urine culture -Continuous Cardiac Monitoring -Continuous Pulse Ox -A.m. CMP/CBC - f/u TSH -Nutrition consult -PT/OT evaluate and treat -Consider SLP consult if any concerns of difficulty swallowing -Monitor patient's mental status tomorrow and can consider imaging if indicated  Covid-19 infection Found to be Covid positive on admission 09/23/2020.  Patient breathing well on room air at this time.  Daughter indicates symptoms started on 19th and have worsened.  Symptoms have included fatigue, decreased appetitie and diarrhea.  Currently Afebrile and satting at 99% on RA.  Believe symptoms likely due to COVID so sarting Remdesevir would be appropriate.  Spoke with ED Pharmacist due to elevated Liver labs who indicated this would not be  -Monitor O2, maintain above 90%.   -Consider starting Remdesivir -f/u COVID labs and inflammatory markers -Monitor for fevers  AKI: Patient is initial creatinine of 1.35, baseline appears  to be around 1.0.  Has had decreased PO intake over past several days. -Avoid nephrotoxic agents -We will hold losartan at this time -A.m. BMP -Give 500 mL bolus, followed by mIVF NS  Hx elevated  LFTs LFTs mildly elevated with AST 73, ALT 58 on admission.  Previous LFTs on 09/06/20 with AST 57, ALT 71. -A.m. CMP  Protein calorie malnutrition: Initial albumin of 2.7.  The patient was admitted for concerns for failure to thrive and seems to have poor p.o. intake over the past week per history. -Nutrition consult -Consider Ensure or other no replacement substitutes  Hyperlipidemia: Last LDL 75 on 05/25/20.  Home medications include Lipitor 40 mg a day. -Continue home Lipitor  Type 2 diabetes Last A1c 11.1 on 08/17/2020.  Current CBGs 250.  Home medications include glimepiride 2 mg tablet daily, Tresiba 60U daily, Ozempic 0.25 mg once per week.  Per the patient's daughter she has not taken her insulin in at least 3 or 4 days due to feeling ill and not eating much. -Sensitive sliding scale -CBG checks q4h -Will start Lantus on reduced dose of 20u tomorrow, will give one dose of 8u Lantus tonight-we will use Lantus due to formulary.. - Hold home glimepiride, Ozempic  Hypertension BP since admission ranges from 124-147/62-97.  Home medications include losartan 25 mg/day -Hold home losartan in setting of AKI  FEN/GI: NPO pending bedside swallow Prophylaxis: Lovenox  Disposition: Med-Surg  History of Present Illness:  Jacqueline Goodman is a 70 y.o. female presenting with altered mental status.  Patient is a 70 year old female who can normally get up with assistance and move around, but over the last several days has gotten weaker and getting out of bed.  She has had multiple falls at home when trying to get out of bed with family's assistance.  She is also had a reduction in p.o. intake over that time.  Daughter states that she had Covid herself on the 16th but was doing better.  Per daughter she started vomiting on 09/14/20 and then again this morning but not between. Daughter says at baseline she knows her name and where she is but will sometimes have to ask questions multiple times. She  states earlier this week she had difficulty with speech and more confusion. She had a fall Monday night that was witnessed and she did not hit her head. She could walk on Tuesday to her chair. Less appetite for months. The main reason she is coming in is weakness and increasing confusion. She had to have help to get off the toilet the other day which is unusual. Stayed in a chair all Tuesday and Wednesday due to weakness.  Daughter indicates patient is not vaccinated.  No tobacco, alcohol, or drugs.   Review Of Systems: Per HPI with the following additions:   Per daughter: Review of Systems  Unable to perform ROS: Mental status change  Constitutional: Positive for chills and fatigue. Negative for fever.  HENT: Negative for congestion.   Respiratory: Positive for cough. Negative for shortness of breath.   Cardiovascular: Negative for chest pain.  Gastrointestinal: Positive for abdominal pain, diarrhea and vomiting.  Genitourinary: Negative for difficulty urinating.  Musculoskeletal: Positive for myalgias.     Patient Active Problem List   Diagnosis Date Noted  . COVID-19 09/23/2020  . Elevated liver enzymes 08/17/2020  . Weight loss 03/15/2020  . Vertigo 05/12/2019  . Obesity 12/25/2017  . Pain in joint, ankle and foot 11/13/2017  . 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  Situational anxiety 03/13/2017  . Hyperlipidemia 07/05/2016  . Essential hypertension 06/28/2016  . Diabetes mellitus type 2 in obese ([place]) 06/07/2016    Past Medical History: Past Medical History:  Diagnosis Date  . Diabetes mellitus without complication ([place])   . Hemorrhoids   . Hypertension   . Pneumonia    as a child  . PONV (postoperative nausea and vomiting)   . Wound infection after surgery 08/2017    Past Surgical History: Past Surgical History:  Procedure Laterality Date  . ABDOMINAL HYSTERECTOMY     PARTIAL  . ABDOMINOPLASTY  07/2017   tummy tuck  . CHOLECYSTECTOMY    . COLONOSCOPY    .  DEBRIDEMENT AND CLOSURE WOUND N/A 09/02/2017   Procedure: WOUND DEBRIDEMENT;  Surgeon: Youlanda Roys, MD;  Location: [facility];  Service: Plastics;  Laterality: N/A;  . INCISION AND DRAINAGE OF WOUND N/A 08/15/2017   Procedure: Ananias Pilgrim AND DEBRIDEMENT OF ABDOMINAL WOUND;  Surgeon: Youlanda Roys, MD;  Location: [facility];  Service: Plastics;  Laterality: N/A;  SPINAL    Social History: Social History   Tobacco Use  . Smoking status: Never Smoker  . Smokeless tobacco: Never Used  Vaping Use  . Vaping Use: Never used  Substance Use Topics  . Alcohol use: No  . Drug use: No   Additional social history:   Please also refer to relevant sections of EMR.  Family History: Family History  Problem Relation Age of Onset  . Cancer Mother   . Breast cancer Mother   . Heart disease Father   . Alcohol abuse Father   . Diabetes Father   . Alcohol abuse Sister   . Heart disease Sister      Allergies and Medications: Allergies  Allergen Reactions  . Morphine And Related Other (See Comments)    "says doesn't work"  . Tape Rash    Breaks the skin down   No current facility-administered medications on file prior to encounter.   Current Outpatient Medications on File Prior to Encounter  Medication Sig Dispense Refill  . atorvastatin (LIPITOR) 40 MG tablet TAKE 1 TABLET BY MOUTH EVERY DAY (Patient taking differently: Take 40 mg by mouth daily.) 90 tablet 3  . glimepiride (AMARYL) 2 MG tablet Take 1 tablet (2 mg total) by mouth daily with breakfast. 9060 tablet 0  . insulin degludec (TRESIBA FLEXTOUCH) 200 UNIT/ML FlexTouch Pen Inject 60 Units into the skin daily. 9 mL 3  . losartan (COZAAR) 25 MG tablet TAKE 1 TABLET BY MOUTH EVERY DAY (Patient taking differently: Take 25 mg by mouth daily.) 90 tablet 0  . Semaglutide,0.25 or 0.5MG /DOS, (OZEMPIC, 0.25 OR 0.5 MG/DOSE,) 2 MG/1.5ML SOPN Inject 0.5 mg into the skin once a week. (Patient taking differently: Inject 0.25 mg into the skin  once a week. Monday) 1.5 mL 3  . BESIVANCE 0.6 % SUSP Apply 1 drop to eye 3 (three) times daily.    . Insulin Pen Needle (BD PEN NEEDLE NANO 2ND GEN) 32G X 4 MM MISC Use as directed once daily.  May substitute for needle that fits Basaglar Kwikpen 100 each 2  . Insulin Syringe-Needle U-100 (B-D INS SYR ULTRAFINE .3CC/31G) 31G X 5/16" 0.3 ML MISC Use as directed 100 each 1  . ONETOUCH ULTRA test strip USE TO TEST BLOOD SUGAR ONCE EVERY MORNING BEFORE EATING. ICD-10 CODE: E11.9 100 strip 3    Objective: BP (!) 147/80   Pulse 88   Temp 98.5 F (36.9 C) (Oral)  Resp (!) 27   SpO2 98%  Exam:  Physical Exam Constitutional:      General: She is not in acute distress.    Appearance: She is not ill-appearing.     Comments: Patient with shivering on physical exam, having chills.  HENT:     Head: Normocephalic.     Mouth/Throat:     Mouth: Mucous membranes are dry.  Eyes:     Extraocular Movements: Extraocular movements intact.     Pupils: Pupils are equal, round, and reactive to light.  Cardiovascular:     Rate and Rhythm: Normal rate and regular rhythm.     Pulses: Normal pulses.  Pulmonary:     Effort: Pulmonary effort is normal. No respiratory distress.     Breath sounds: Normal breath sounds.  Abdominal:     General: Abdomen is flat. There is no distension.     Palpations: Abdomen is soft.     Comments: Patient with large abdominal scar across the left lower quadrant from a previous surgery several years ago.  Musculoskeletal:        General: No tenderness.     Right lower leg: No edema.     Left lower leg: No edema.  Skin:    General: Skin is warm and dry.     Capillary Refill: Capillary refill takes less than 2 seconds.  Neurological:     General: No focal deficit present.     Mental Status: She is alert.     Cranial Nerves: No cranial nerve deficit.     Motor: No weakness (No focal weakness, patient does have generalized weakness.).     Comments: Oriented to person,  not place or time, able to state who President is, follows commands appropriately.      Labs and Imaging: CBC BMET  Recent Labs  Lab 09/23/20 1359 09/23/20 1509  WBC 6.5  --   HGB 13.3 13.3  HCT 39.4 39.0  PLT 192  --    Recent Labs  Lab 09/23/20 1610  NA 136  K 4.4  CL 102  CO2 24  BUN 51*  CREATININE 1.35*  GLUCOSE 269*  CALCIUM 8.5*      Maness, Arnette Norris, MD 09/23/2020, 6:58 PM PGY-1, [facility] pager: [phone], text pages welcome  Upper Level Addendum:  I have seen and evaluated this patient along with Dr. Manus Rudd and reviewed the above note, making necessary revisions as appropriate.  Lurline Del, DO 09/23/2020, 7:14 PM PGY-2, [place]

## 2020-09-23 NOTE — ED Notes (Signed)
Received report from Brittany RN

## 2020-09-23 NOTE — ED Provider Notes (Signed)
Pt signed out to from Dr. Dina Rich pending labs.  Pt's I-stat showed an elevated K, but the specimen was hemolyzed.  Pt's daughter is in the room now.  She is a much better historian.  Pt can normally walk with assistance, but is not able to walk now.  She has not been able to get out of bed and has not been eating or drinking much.  She is positive for Covid.  PT has not been vaccinated.  Pt's daughter thinks she (daughter) may have had Covid on the 16th.  Pt's sx started on 1/24.  Pt is not hypoxic and CXR is clear.  PT started on Remdesivir.  Pt d/w FP for admission.  Diani Jillson Henrikson was evaluated in Emergency Department on 09/23/2020 for the symptoms described in the history of present illness. She was evaluated in the context of the global COVID-19 pandemic, which necessitated consideration that the patient might be at risk for infection with the SARS-CoV-2 virus that causes COVID-19. Institutional protocols and algorithms that pertain to the evaluation of patients at risk for COVID-19 are in a state of rapid change based on information released by regulatory bodies including the CDC and federal and state organizations. These policies and algorithms were followed during the patient's care in the ED.   Isla Pence, MD 09/23/20 [phone]

## 2020-09-24 ENCOUNTER — Inpatient Hospital Stay (HOSPITAL_COMMUNITY): Payer: Medicare Other

## 2020-09-24 DIAGNOSIS — U071 COVID-19: Secondary | ICD-10-CM | POA: Diagnosis present

## 2020-09-24 DIAGNOSIS — Z833 Family history of diabetes mellitus: Secondary | ICD-10-CM | POA: Diagnosis not present

## 2020-09-24 DIAGNOSIS — G9341 Metabolic encephalopathy: Secondary | ICD-10-CM

## 2020-09-24 DIAGNOSIS — F0391 Unspecified dementia with behavioral disturbance: Secondary | ICD-10-CM | POA: Diagnosis not present

## 2020-09-24 DIAGNOSIS — R41841 Cognitive communication deficit: Secondary | ICD-10-CM | POA: Diagnosis not present

## 2020-09-24 DIAGNOSIS — Z811 Family history of alcohol abuse and dependence: Secondary | ICD-10-CM | POA: Diagnosis not present

## 2020-09-24 DIAGNOSIS — R627 Adult failure to thrive: Secondary | ICD-10-CM | POA: Diagnosis present

## 2020-09-24 DIAGNOSIS — Z803 Family history of malignant neoplasm of breast: Secondary | ICD-10-CM | POA: Diagnosis not present

## 2020-09-24 DIAGNOSIS — Z7189 Other specified counseling: Secondary | ICD-10-CM | POA: Diagnosis not present

## 2020-09-24 DIAGNOSIS — Z794 Long term (current) use of insulin: Secondary | ICD-10-CM | POA: Diagnosis not present

## 2020-09-24 DIAGNOSIS — R531 Weakness: Secondary | ICD-10-CM | POA: Diagnosis not present

## 2020-09-24 DIAGNOSIS — R279 Unspecified lack of coordination: Secondary | ICD-10-CM | POA: Diagnosis not present

## 2020-09-24 DIAGNOSIS — F0151 Vascular dementia with behavioral disturbance: Secondary | ICD-10-CM | POA: Diagnosis present

## 2020-09-24 DIAGNOSIS — E860 Dehydration: Secondary | ICD-10-CM | POA: Diagnosis present

## 2020-09-24 DIAGNOSIS — I10 Essential (primary) hypertension: Secondary | ICD-10-CM | POA: Diagnosis present

## 2020-09-24 DIAGNOSIS — R2681 Unsteadiness on feet: Secondary | ICD-10-CM | POA: Diagnosis not present

## 2020-09-24 DIAGNOSIS — Z8249 Family history of ischemic heart disease and other diseases of the circulatory system: Secondary | ICD-10-CM | POA: Diagnosis not present

## 2020-09-24 DIAGNOSIS — Z6837 Body mass index (BMI) 37.0-37.9, adult: Secondary | ICD-10-CM | POA: Diagnosis not present

## 2020-09-24 DIAGNOSIS — F32A Depression, unspecified: Secondary | ICD-10-CM | POA: Diagnosis present

## 2020-09-24 DIAGNOSIS — Z888 Allergy status to other drugs, medicaments and biological substances status: Secondary | ICD-10-CM | POA: Diagnosis not present

## 2020-09-24 DIAGNOSIS — R410 Disorientation, unspecified: Secondary | ICD-10-CM | POA: Diagnosis not present

## 2020-09-24 DIAGNOSIS — R296 Repeated falls: Secondary | ICD-10-CM | POA: Diagnosis present

## 2020-09-24 DIAGNOSIS — M6259 Muscle wasting and atrophy, not elsewhere classified, multiple sites: Secondary | ICD-10-CM | POA: Diagnosis not present

## 2020-09-24 DIAGNOSIS — E46 Unspecified protein-calorie malnutrition: Secondary | ICD-10-CM | POA: Diagnosis present

## 2020-09-24 DIAGNOSIS — Z7401 Bed confinement status: Secondary | ICD-10-CM | POA: Diagnosis not present

## 2020-09-24 DIAGNOSIS — N179 Acute kidney failure, unspecified: Secondary | ICD-10-CM

## 2020-09-24 DIAGNOSIS — E785 Hyperlipidemia, unspecified: Secondary | ICD-10-CM | POA: Diagnosis present

## 2020-09-24 DIAGNOSIS — M2550 Pain in unspecified joint: Secondary | ICD-10-CM | POA: Diagnosis not present

## 2020-09-24 DIAGNOSIS — E669 Obesity, unspecified: Secondary | ICD-10-CM | POA: Diagnosis not present

## 2020-09-24 DIAGNOSIS — M6281 Muscle weakness (generalized): Secondary | ICD-10-CM | POA: Diagnosis not present

## 2020-09-24 DIAGNOSIS — E1169 Type 2 diabetes mellitus with other specified complication: Secondary | ICD-10-CM | POA: Diagnosis not present

## 2020-09-24 DIAGNOSIS — R4182 Altered mental status, unspecified: Secondary | ICD-10-CM | POA: Diagnosis not present

## 2020-09-24 DIAGNOSIS — Z79899 Other long term (current) drug therapy: Secondary | ICD-10-CM | POA: Diagnosis not present

## 2020-09-24 DIAGNOSIS — Z885 Allergy status to narcotic agent status: Secondary | ICD-10-CM | POA: Diagnosis not present

## 2020-09-24 DIAGNOSIS — E440 Moderate protein-calorie malnutrition: Secondary | ICD-10-CM | POA: Diagnosis not present

## 2020-09-24 DIAGNOSIS — E11319 Type 2 diabetes mellitus with unspecified diabetic retinopathy without macular edema: Secondary | ICD-10-CM | POA: Diagnosis present

## 2020-09-24 DIAGNOSIS — Z515 Encounter for palliative care: Secondary | ICD-10-CM | POA: Diagnosis not present

## 2020-09-24 LAB — TSH: TSH: 0.89 u[IU]/mL (ref 0.350–4.500)

## 2020-09-24 LAB — BLOOD GAS, VENOUS
Acid-Base Excess: 0.4 mmol/L (ref 0.0–2.0)
Bicarbonate: 24.5 mmol/L (ref 20.0–28.0)
Drawn by: 5062
FIO2: 21
O2 Saturation: 35.2 %
Patient temperature: 37
pCO2, Ven: 39.6 mmHg — ABNORMAL LOW (ref 44.0–60.0)
pH, Ven: 7.408 (ref 7.250–7.430)
pO2, Ven: <31 mmHg — CL (ref 32.0–45.0)

## 2020-09-24 LAB — COMPREHENSIVE METABOLIC PANEL
ALT: 53 U/L — ABNORMAL HIGH (ref 0–44)
AST: 66 U/L — ABNORMAL HIGH (ref 15–41)
Albumin: 2.5 g/dL — ABNORMAL LOW (ref 3.5–5.0)
Alkaline Phosphatase: 69 U/L (ref 38–126)
Anion gap: 9 (ref 5–15)
BUN: 46 mg/dL — ABNORMAL HIGH (ref 8–23)
CO2: 23 mmol/L (ref 22–32)
Calcium: 7.9 mg/dL — ABNORMAL LOW (ref 8.9–10.3)
Chloride: 103 mmol/L (ref 98–111)
Creatinine, Ser: 1.22 mg/dL — ABNORMAL HIGH (ref 0.44–1.00)
GFR, Estimated: 48 mL/min — ABNORMAL LOW (ref >60)
Glucose, Bld: 221 mg/dL — ABNORMAL HIGH (ref 70–99)
Potassium: 3.9 mmol/L (ref 3.5–5.1)
Sodium: 135 mmol/L (ref 135–145)
Total Bilirubin: 0.8 mg/dL (ref 0.3–1.2)
Total Protein: 5.8 g/dL — ABNORMAL LOW (ref 6.5–8.1)

## 2020-09-24 LAB — GLUCOSE, CAPILLARY
Glucose-Capillary: 116 mg/dL — ABNORMAL HIGH (ref 70–99)
Glucose-Capillary: 160 mg/dL — ABNORMAL HIGH (ref 70–99)
Glucose-Capillary: 160 mg/dL — ABNORMAL HIGH (ref 70–99)
Glucose-Capillary: 183 mg/dL — ABNORMAL HIGH (ref 70–99)
Glucose-Capillary: 196 mg/dL — ABNORMAL HIGH (ref 70–99)
Glucose-Capillary: 318 mg/dL — ABNORMAL HIGH (ref 70–99)

## 2020-09-24 LAB — CBC WITH DIFFERENTIAL/PLATELET
Abs Immature Granulocytes: 0.04 10*3/uL (ref 0.00–0.07)
Basophils Absolute: 0 10*3/uL (ref 0.0–0.1)
Basophils Relative: 0 %
Eosinophils Absolute: 0 10*3/uL (ref 0.0–0.5)
Eosinophils Relative: 0 %
HCT: 36.5 % (ref 36.0–46.0)
Hemoglobin: 12.6 g/dL (ref 12.0–15.0)
Immature Granulocytes: 1 %
Lymphocytes Relative: 16 %
Lymphs Abs: 1 10*3/uL (ref 0.7–4.0)
MCH: 29.1 pg (ref 26.0–34.0)
MCHC: 34.5 g/dL (ref 30.0–36.0)
MCV: 84.3 fL (ref 80.0–100.0)
Monocytes Absolute: 0.5 10*3/uL (ref 0.1–1.0)
Monocytes Relative: 8 %
Neutro Abs: 4.8 10*3/uL (ref 1.7–7.7)
Neutrophils Relative %: 75 %
Platelets: 185 10*3/uL (ref 150–400)
RBC: 4.33 MIL/uL (ref 3.87–5.11)
RDW: 12.1 % (ref 11.5–15.5)
WBC: 6.4 10*3/uL (ref 4.0–10.5)
nRBC: 0 % (ref 0.0–0.2)

## 2020-09-24 LAB — CBG MONITORING, ED: Glucose-Capillary: 189 mg/dL — ABNORMAL HIGH (ref 70–99)

## 2020-09-24 LAB — D-DIMER, QUANTITATIVE: D-Dimer, Quant: 1.79 ug/mL-FEU — ABNORMAL HIGH (ref 0.00–0.50)

## 2020-09-24 LAB — C-REACTIVE PROTEIN: CRP: 0.9 mg/dL (ref <1.0)

## 2020-09-24 LAB — LACTIC ACID, PLASMA: Lactic Acid, Venous: 1.5 mmol/L (ref 0.5–1.9)

## 2020-09-24 LAB — MAGNESIUM: Magnesium: 2.1 mg/dL (ref 1.7–2.4)

## 2020-09-24 LAB — HIV ANTIBODY (ROUTINE TESTING W REFLEX): HIV Screen 4th Generation wRfx: NONREACTIVE

## 2020-09-24 MED ORDER — ENOXAPARIN SODIUM 60 MG/0.6ML ~~LOC~~ SOLN
55.0000 mg | SUBCUTANEOUS | Status: DC
  Administered 2020-09-24 – 2020-09-28 (×6): 55 mg via SUBCUTANEOUS
  Filled 2020-09-24 (×6): qty 0.6

## 2020-09-24 MED ORDER — ENSURE ENLIVE PO LIQD
237.0000 mL | Freq: Three times a day (TID) | ORAL | Status: DC
  Administered 2020-09-24 – 2020-09-28 (×11): 237 mL via ORAL

## 2020-09-24 MED ORDER — ADULT MULTIVITAMIN W/MINERALS CH
1.0000 | ORAL_TABLET | Freq: Every day | ORAL | Status: DC
  Administered 2020-09-24 – 2020-09-28 (×5): 1 via ORAL
  Filled 2020-09-24 (×5): qty 1

## 2020-09-24 NOTE — Progress Notes (Addendum)
Spoke with daughter Jacqueline Goodman), she and her brother Jacqueline Goodman are hcPOA for patient.  Background: Patient has had gradual decline in higher-order function for over 6 months including difficulty with paying bills and short-term memory.  She has been consistently able to perform ADLs including hygiene, food, ambulation without problem.  Beginning January 19, patient got sick including vomiting but no fever or respiratory symptoms.  Patient lives with her husband and 2 daughters. Update: Provided daughter with status update on patient and try to assess goals of care as far as discharge.  I foresee patient needing SNF if she does not make a significant recovery from the state that I saw her in this morning.  Daughter states that she needs to talk to her brother and make a combined decision between them on her disposition but she knows that they would not be able to care for her at home without additional support of nurse, aide, PT or other.  She requests a head CT to evaluate for stroke because they have had a family member in the past have similar altered mental status and they had a stroke which went undiagnosed and so is very concerned that this is what is happening to her mother. -Due to patient being hypercoagulable with Covid and having risk factor of poorly controlled diabetes, I think that a head CT is a reasonable test to evaluate her altered mental status.  Head CT without contrast ordered.

## 2020-09-24 NOTE — Plan of Care (Signed)
  Problem: Coping: Goal: Psychosocial and spiritual needs will be supported Outcome: Progressing   Problem: Respiratory: Goal: Will maintain a patent airway Outcome: Progressing Goal: Complications related to the disease process, condition or treatment will be avoided or minimized Outcome: Progressing   

## 2020-09-24 NOTE — ED Notes (Signed)
Attempted report x 2 

## 2020-09-24 NOTE — Progress Notes (Signed)
Family Medicine Teaching Service Daily Progress Note Intern Pager: [phone]  Patient name: Jacqueline Goodman Medical record number: [id number] Date of birth: Oct 14, 1950 Age: 70 y.o. Gender: female  Primary Care Provider: Lind Covert, MD Consultants: Nutrition Code Status: Full  Pt Overview and Major Events to Date:  1/29-admitted for FTT, COVID-19 positive  Assessment and Plan: Jacqueline Goodman is a 70 y.o. female presenting with failure to thrive and found to be COVID-19 positive. PMH is significant for hyperlipidemia, hypertension, type 2 diabetes  Failure to thrive  weakness  fatigue-likely related to Covid infection. Appears to be stable status from yesterday. Passed bedside swallow screen. Need to speak to family member to find baseline. Concern for stroke still remains although no focal findings on exam and could be contributed to advanced dementia. -mIV if not tolerating PO - contact daughter in regards to baseline and [facility] - consider palliative consult - TOC for placement  - PT/OT  Mild Covid-19- stable.  Not vaccinated. -Monitor O2, maintain above 90%.   -continue Remdesivir  AKI- improved. Cr 1.22 today.  -Avoid nephrotoxic agents -A.m. BMP  Hx elevated LFTs- improved today. AST 66, ALT 53 -A.m. CMP  Protein calorie malnutrition- stable. Albumin 2.5 today -Nutrition consult -Consider Ensure or other no replacement substitutes  Hyperlipidemia- chronic, stable -Continue home Lipitor  Type 2 diabetes- chronic, moderately well controlled.  -Sensitive sliding scale with meals and bedtime - discontinue home glimepiride - continue home Ozempic  Hypertension- well controlled. BP 133/78 today -Hold home losartan in setting of AKI  FEN/GI: carb modified diet Prophylaxis: Lovenox Status is: Observation  The patient will require care spanning > 2 midnights and should be moved to inpatient because: Unsafe d/c plan  Dispo: The patient is from: Home               Anticipated d/c is to: SNF              Anticipated d/c date is: 3 days              Patient currently is not medically stable to d/c.   Difficult to place patient No  Subjective:  Patient is awake and alert. Able to follow commands. She is not complaining of anything. Endorses pain but unable to qualify. She is oriented to self. Unable to answer further questions but looks like she is trying to.   Objective: Temp:  [98.2 F (36.8 C)-99.4 F (37.4 C)] 99.4 F (37.4 C) (01/29 0744) Pulse Rate:  [80-94] 86 (01/29 0744) Resp:  [15-35] 18 (01/29 0744) BP: (124-147)/(60-97) 133/78 (01/29 0744) SpO2:  [95 %-100 %] 95 % (01/29 0744) Physical Exam: General: NAD at rest. Derm: damp skin Cardiovascular: RRR Respiratory: no increased WOB, unable to get reliable lung exam on back due to patient being weak and unable to fully sit up Abdomen: sodt, obese, non-tender Extremities: no LE edema. Quick cap refill  Laboratory: Recent Labs  Lab 09/23/20 1359 09/23/20 1509 09/24/20 0222  WBC 6.5  --  6.4  HGB 13.3 13.3 12.6  HCT 39.4 39.0 36.5  PLT 192  --  185   Recent Labs  Lab 09/23/20 1509 09/23/20 1610 09/24/20 0222  NA 132* 136 135  K 6.8* 4.4 3.9  CL  --  102 103  CO2  --  24 23  BUN  --  51* 46*  CREATININE  --  1.35* 1.22*  CALCIUM  --  8.5* 7.9*  PROT  --  6.2* 5.8*  BILITOT  --  0.6 0.8  ALKPHOS  --  71 69  ALT  --  58* 53*  AST  --  73* 66*  GLUCOSE  --  269* 221*   TSH wnl Mg 2.1  Imaging/Diagnostic Tests: DG Chest Port 1 View  Result Date: 09/23/2020 CLINICAL DATA:  Short of breath EXAM: PORTABLE CHEST 1 VIEW COMPARISON:  09/09/2017 FINDINGS: Mild elevation right hemidiaphragm unchanged. Heart size upper normal. Negative for heart failure. Lungs are clear without infiltrate or effusion. IMPRESSION: No active disease. Electronically Signed   By: Franchot Gallo M.D.   On: 09/23/2020 14:26     Richarda Osmond, DO 09/24/2020, 8:24 AM PGY-3, [place] Intern pager: [phone], text pages welcome

## 2020-09-24 NOTE — Progress Notes (Signed)
Initial Nutrition Assessment  DOCUMENTATION CODES:   Not applicable  INTERVENTION:   Liberalize diet to REGULAR   Ensure Enlive po TID, each supplement provides 350 kcal and 20 grams of protein  MVI daily   NUTRITION DIAGNOSIS:   Increased nutrient needs related to acute illness (COVID infection) as evidenced by estimated needs.  GOAL:   Patient will meet greater than or equal to 90% of their needs  MONITOR:   PO intake,Supplement acceptance,Weight trends,I & O's,Labs  REASON FOR ASSESSMENT:   Consult Assessment of nutrition requirement/status  ASSESSMENT:   Patient with PMH significant for DM, HTN, and HLD. Presents this admission with FTT and COVID infection.   Patient had hard time breathing upon assessment. Reports a decrease in appetite PTA but unable to elaborate. Daughter reports patient has been struggling with appetite since 1/19. Will attempt to obtain more nutrition history if possible. No meal intakes documented this admission. Discussed the importance of protein intake for preservation of lean body mass with patient. Patient willing to try supplementation.   Patient unsure of UBW and unsure of weight loss. Records show admission weight that was pulled from previous admission. Will need to obtain actual weight to assess for weight loss.   Medications: SS novolog, lantus Labs: CBG [id number]  Diet Order:   Diet Order            Diet Carb Modified Fluid consistency: Thin; Room service appropriate? Yes  Diet effective now                 EDUCATION NEEDS:   Education needs have been addressed  Skin:  Skin Assessment: Skin Integrity Issues: Skin Integrity Issues:: Other (Comment) Other: MASD- breast  Last BM:  PTA  Height:   Ht Readings from Last 1 Encounters:  04/26/20 5' 8.5" (1.74 m)    Weight:   Wt Readings from Last 1 Encounters:  09/24/20 113 kg   BMI:  Body mass index is 37.33 kg/m.  Estimated Nutritional Needs:   Kcal:   1800-2000 kcal  Protein:  90-105 grams  Fluid:  >/= 1.8 L/day  Mariana Single RD, LDN Clinical Nutrition Pager listed in [place]

## 2020-09-24 NOTE — Evaluation (Signed)
Physical Therapy Evaluation Patient Details Name: Jacqueline Goodman MRN: [id number] DOB: January 29, 1951 Today's Date: 09/24/2020   History of Present Illness  70 y.o. female presenting with failure to thrive and found to be COVID-19 positive. PMH is significant for hyperlipidemia, hypertension, type 2 diabetes.  Clinical Impression  Pt presents to PT with deficits in cognition, functional mobility, gait, balance, endurance, strength, power. Pt is very confused and anxious upon PT arrival, reporting she does not know where she is at this time. Pt has poor memory, unable to recall where she is at end of session afte PT re-oriented multiple times during session. Pt with multiple posterior losses of balance which she is unable to correct for in sitting. PT defers attempts at standing due to safety concerns. Pt will benefit from continued acute PT services to improve mobility quality and to reduce falls risk. PT recommends SNF placement at this time.    Follow Up Recommendations SNF;Supervision/Assistance - 24 hour    Equipment Recommendations  Hospital bed;Wheelchair (measurements PT);Wheelchair cushion (measurements PT);Other (comment) (mechanical lift, all if home today)    Recommendations for Other Services       Precautions / Restrictions Precautions Precautions: Fall Restrictions Weight Bearing Restrictions: No      Mobility  Bed Mobility Overal bed mobility: Needs Assistance Bed Mobility: Supine to Sit;Sit to Supine     Supine to sit: Max assist;HOB elevated Sit to supine: Max assist;HOB elevated        Transfers                 General transfer comment: deferred due to impaired balance and cognition  Ambulation/Gait                Stairs            Wheelchair Mobility    Modified Rankin (Stroke Patients Only)       Balance Overall balance assessment: Needs assistance Sitting-balance support: Single extremity supported;Bilateral upper extremity  supported;Feet supported Sitting balance-Leahy Scale: Poor Sitting balance - Comments: minG with brief periods of maxA due to posterior losses of balance Postural control: Posterior lean                                   Pertinent Vitals/Pain Pain Assessment: Faces Faces Pain Scale: Hurts little more Pain Location: generalized Pain Descriptors / Indicators: Grimacing Pain Intervention(s): Monitored during session    Home Living Family/patient expects to be discharged to:: Private residence Living Arrangements: Spouse/significant other;Children   Type of Home: House Home Access: Stairs to enter Entrance Stairs-Rails: Right;Left;Can reach both Technical brewer of Steps: 3 Home Layout: One level Home Equipment: Environmental consultant - 2 wheels;Cane - single point;Shower seat Additional Comments: pt is unable to provide history due to AMS. History is recorded from prior admission in 2019, will need to be confirmed with family    Prior Function Level of Independence: Needs assistance   Gait / Transfers Assistance Needed: pt reports walking some, also reports she is still pushed around in an office chair at times. Unreliable historian           Hand Dominance        Extremity/Trunk Assessment   Upper Extremity Assessment Upper Extremity Assessment: Defer to OT evaluation    Lower Extremity Assessment Lower Extremity Assessment: Generalized weakness    Cervical / Trunk Assessment Cervical / Trunk Assessment: Other exceptions Cervical / Trunk Exceptions: excess body  habitus  Communication   Communication: Expressive difficulties;Receptive difficulties (vs very slowed processing)  Cognition Arousal/Alertness: Awake/alert Behavior During Therapy: Anxious;Restless Overall Cognitive Status: No family/caregiver present to determine baseline cognitive functioning                                 General Comments: pt is oriented to person but requires  choice of 2 to get her birthday correct. Pt is disoriented to place, time, and situation. Pt demonstrates significantly slowed processing and no awareness of her current mobility deficits.      General Comments General comments (skin integrity, edema, etc.): VSS on RA    Exercises     Assessment/Plan    PT Assessment Patient needs continued PT services  PT Problem List Decreased strength;Decreased activity tolerance;Decreased balance;Decreased mobility;Decreased cognition;Decreased knowledge of use of DME;Decreased safety awareness;Decreased knowledge of precautions       PT Treatment Interventions DME instruction;Gait training;Functional mobility training;Therapeutic activities;Therapeutic exercise;Balance training;Neuromuscular re-education;Cognitive remediation;Patient/family education;Wheelchair mobility training    PT Goals (Current goals can be found in the Care Plan section)  Acute Rehab PT Goals Patient Stated Goal: pt unable to state due to AMS, PT goal to improve balance and functional mobility in an effort to reduce falls risk Time For Goal Achievement: 10/08/20 Potential to Achieve Goals: Fair    Frequency Min 2X/week   Barriers to discharge        Co-evaluation               AM-PAC PT "6 Clicks" Mobility  Outcome Measure Help needed turning from your back to your side while in a flat bed without using bedrails?: A Lot Help needed moving from lying on your back to sitting on the side of a flat bed without using bedrails?: A Lot Help needed moving to and from a bed to a chair (including a wheelchair)?: Total Help needed standing up from a chair using your arms (e.g., wheelchair or bedside chair)?: Total Help needed to walk in hospital room?: Total Help needed climbing 3-5 steps with a railing? : Total 6 Click Score: 8    End of Session   Activity Tolerance: Other (comment) (limited by anxiety and AMS) Patient left: in bed;with call bell/phone within  reach;with bed alarm set Nurse Communication: Mobility status;Need for lift equipment PT Visit Diagnosis: Other abnormalities of gait and mobility (R26.89);Muscle weakness (generalized) (M62.81);Other symptoms and signs involving the nervous system (R29.898);Adult, failure to thrive (R62.7)    Time: 8182-9937 PT Time Calculation (min) (ACUTE ONLY): 24 min   Charges:   PT Evaluation $PT Eval Moderate Complexity: 1 Mod          Zenaida Niece, PT, DPT Acute Rehabilitation Pager: [phone]   Zenaida Niece 09/24/2020, 12:51 PM

## 2020-09-24 NOTE — Evaluation (Signed)
Occupational Therapy Evaluation Patient Details Name: Jacqueline Goodman MRN: [id number] DOB: 02/20/1951 Today's Date: 09/24/2020    History of Present Illness 70 y.o. female presenting with failure to thrive and found to be COVID-19 positive. PMH is significant for hyperlipidemia, hypertension, type 2 diabetes.   Clinical Impression   Pt PTA: pt living with family and recently not getting OOB or sleeping only in recliner and unable to get up. Pt with decrease p.o. intake. Pt currently, A/O x2 to self and date- pt poor histoian- pt did not say more than 2 words and follow 2 commands the entire session without mumbling. Pt limited by cognitive deficits, ability to properly care for self and ability to mobilize beyond supine in bed without assist. Pt set-upA to maxA for ADL and maxA for bed mobility. Pt would benefit from continued OT skilled services for ADL, mobility and safety in SNF setting. OT following acutely.    Follow Up Recommendations  SNF;Supervision/Assistance - 24 hour    Equipment Recommendations  3 in 1 bedside commode;Wheelchair (measurements OT);Wheelchair cushion (measurements OT)    Recommendations for Other Services       Precautions / Restrictions Precautions Precautions: Fall Restrictions Weight Bearing Restrictions: No      Mobility Bed Mobility Overal bed mobility: Needs Assistance Bed Mobility: Supine to Sit;Sit to Supine     Supine to sit: Max assist;HOB elevated Sit to supine: Max assist;HOB elevated   General bed mobility comments: Pt assisting x1 time to scoot self toward [facility] in trendelenberg position.    Transfers                 General transfer comment: deferred due to impaired ability to follow commands and poor sitting balance    Balance Overall balance assessment: Needs assistance Sitting-balance support: Single extremity supported;Bilateral upper extremity supported;Feet supported Sitting balance-Leahy Scale: Poor Sitting balance  - Comments: Pt sitting EOB with supervisionA to minA with cues to sit upright. Postural control: Posterior lean                                 ADL either performed or assessed with clinical judgement   ADL Overall ADL's : Needs assistance/impaired Eating/Feeding: Minimal assistance;Sitting Eating/Feeding Details (indicate cue type and reason): sipping from water Grooming: Minimal assistance Grooming Details (indicate cue type and reason): Brush teeth after set-upA; sequencing cues Upper Body Bathing: Minimal assistance;Sitting   Lower Body Bathing: Maximal assistance;Sitting/lateral leans;Sit to/from stand   Upper Body Dressing : Minimal assistance;Sitting   Lower Body Dressing: Maximal assistance;Sitting/lateral leans   Toilet Transfer: Minimal assistance;Cueing for safety   Toileting- Clothing Manipulation and Hygiene: Maximal assistance;Sit to/from stand       Functional mobility during ADLs: Maximal assistance General ADL Comments: Pt limited by cognitive deficits and ability to properly care for self and mobilize.     Vision   Vision Assessment?: No apparent visual deficits Additional Comments: Continue to assess; pt keepnig eyes closed for most of session     Perception     Praxis      Pertinent Vitals/Pain Pain Assessment: Faces Faces Pain Scale: Hurts little more Pain Location: generalized Pain Descriptors / Indicators: Grimacing Pain Intervention(s): Monitored during session     Hand Dominance Right   Extremity/Trunk Assessment Upper Extremity Assessment Upper Extremity Assessment: Generalized weakness;LUE deficits/detail;RUE deficits/detail RUE Deficits / Details: AROM, WFLs; slow to respond RUE Coordination: decreased fine motor;decreased gross motor LUE Deficits /  Details: 2/5 MM grade; slow to initiate movement, decreased ROM and strength LUE Coordination: decreased fine motor;decreased gross motor   Lower Extremity Assessment Lower  Extremity Assessment: Generalized weakness;Defer to PT evaluation   Cervical / Trunk Assessment Cervical / Trunk Assessment: Other exceptions Cervical / Trunk Exceptions: excess body habitus   Communication Communication Communication: Expressive difficulties;Receptive difficulties (increased slowed processing;pt not saying much during session other than "ouch" and "you're rough" when OTR attempting to move pillow under head.)   Cognition Arousal/Alertness: Awake/alert Behavior During Therapy: Anxious;Restless Overall Cognitive Status: No family/caregiver present to determine baseline cognitive functioning                                 General Comments: Pt Ox2 to self and birthday. Pt is disoriented to place, time, and situation. Pt with significant deficits in current awareness of deficits and pt did not say more than 2 words and follow 2 commands the entire session without mumbling.   General Comments  VSS on RA; BP stable, O2 >90% on RA.    Exercises     Shoulder Instructions      Home Living Family/patient expects to be discharged to:: Private residence Living Arrangements: Spouse/significant other;Children   Type of Home: House Home Access: Stairs to enter Technical brewer of Steps: 3 Entrance Stairs-Rails: Right;Left;Can reach both Home Layout: One level     Bathroom Shower/Tub: Teacher, early years/pre: Handicapped height     Home Equipment: Environmental consultant - 2 wheels;Cane - single point;Shower seat   Additional Comments: pt is unable to provide history due to AMS. History is recorded from prior admission in 2019, will need to be confirmed with family      Prior Functioning/Environment Level of Independence: Needs assistance  Gait / Transfers Assistance Needed: pt reports walking some, also reports she is still pushed around in an office chair at times. Unreliable historian ADL's / Homemaking Assistance Needed: unreliable historian             OT Problem List: Decreased strength;Decreased activity tolerance;Impaired balance (sitting and/or standing);Decreased coordination;Decreased safety awareness;Impaired UE functional use;Pain;Increased edema;Cardiopulmonary status limiting activity      OT Treatment/Interventions: Self-care/ADL training;Therapeutic exercise;Therapeutic activities;Patient/family education;Balance training;Neuromuscular education;Energy conservation;Cognitive remediation/compensation;Visual/perceptual remediation/compensation    OT Goals(Current goals can be found in the care plan section) Acute Rehab OT Goals Patient Stated Goal: pt unable to state due to AMS, OT goal to improve ability to care for self OT Goal Formulation: With patient Time For Goal Achievement: 10/08/20 Potential to Achieve Goals: Good ADL Goals Pt Will Perform Eating: with set-up;sitting Pt Will Perform Grooming: with set-up;sitting Pt Will Transfer to Toilet: with max assist;stand pivot transfer;bedside commode Pt Will Perform Toileting - Clothing Manipulation and hygiene: with mod assist;sitting/lateral leans;sit to/from stand Pt/caregiver will Perform Home Exercise Program: Increased strength;Left upper extremity;With theraband;With minimal assist Additional ADL Goal #1: Pt will participate in x15 mins of ADL tasks with 2 rest breaks in order to increase independence. Additional ADL Goal #2: Pt will demonstate selective attention during sequencing through ADL tasks in minimall distracting environment with minimal cues.  OT Frequency: Min 2X/week   Barriers to D/C:            Co-evaluation              AM-PAC OT "6 Clicks" Daily Activity     Outcome Measure Help from another person eating meals?: A Little Help from  another person taking care of personal grooming?: A Lot Help from another person toileting, which includes using toliet, bedpan, or urinal?: A Lot Help from another person bathing (including washing,  rinsing, drying)?: A Lot Help from another person to put on and taking off regular upper body clothing?: A Little Help from another person to put on and taking off regular lower body clothing?: A Lot 6 Click Score: 14   End of Session Nurse Communication: Mobility status  Activity Tolerance: Treatment limited secondary to medical complications (Comment) Patient left: in bed;with call bell/phone within reach;with bed alarm set  OT Visit Diagnosis: Unsteadiness on feet (R26.81);Muscle weakness (generalized) (M62.81);Other symptoms and signs involving cognitive function;Hemiplegia and hemiparesis Hemiplegia - Right/Left: Left Hemiplegia - dominant/non-dominant: Non-Dominant Hemiplegia - caused by: Cerebral infarction                Time: 2355-7322 OT Time Calculation (min): 31 min Charges:  OT General Charges $OT Visit: 1 Visit OT Evaluation $OT Eval Moderate Complexity: 1 Mod OT Treatments $Self Care/Home Management : 8-22 mins  Jefferey Pica, OTR/L Acute Rehabilitation Services Pager: [phone] Office: [phone]  Johannah Rozas C 09/24/2020, 2:52 PM

## 2020-09-24 NOTE — ED Notes (Signed)
Family at bedside. 

## 2020-09-25 DIAGNOSIS — Z515 Encounter for palliative care: Secondary | ICD-10-CM

## 2020-09-25 DIAGNOSIS — Z7189 Other specified counseling: Secondary | ICD-10-CM

## 2020-09-25 LAB — COMPREHENSIVE METABOLIC PANEL
ALT: 49 U/L — ABNORMAL HIGH (ref 0–44)
AST: 64 U/L — ABNORMAL HIGH (ref 15–41)
Albumin: 2.4 g/dL — ABNORMAL LOW (ref 3.5–5.0)
Alkaline Phosphatase: 65 U/L (ref 38–126)
Anion gap: 12 (ref 5–15)
BUN: 49 mg/dL — ABNORMAL HIGH (ref 8–23)
CO2: 22 mmol/L (ref 22–32)
Calcium: 8.2 mg/dL — ABNORMAL LOW (ref 8.9–10.3)
Chloride: 102 mmol/L (ref 98–111)
Creatinine, Ser: 1.17 mg/dL — ABNORMAL HIGH (ref 0.44–1.00)
GFR, Estimated: 51 mL/min — ABNORMAL LOW (ref >60)
Glucose, Bld: 312 mg/dL — ABNORMAL HIGH (ref 70–99)
Potassium: 3.7 mmol/L (ref 3.5–5.1)
Sodium: 136 mmol/L (ref 135–145)
Total Bilirubin: 0.3 mg/dL (ref 0.3–1.2)
Total Protein: 5.6 g/dL — ABNORMAL LOW (ref 6.5–8.1)

## 2020-09-25 LAB — CBC WITH DIFFERENTIAL/PLATELET
Abs Immature Granulocytes: 0.03 10*3/uL (ref 0.00–0.07)
Basophils Absolute: 0 10*3/uL (ref 0.0–0.1)
Basophils Relative: 0 %
Eosinophils Absolute: 0 10*3/uL (ref 0.0–0.5)
Eosinophils Relative: 0 %
HCT: 39 % (ref 36.0–46.0)
Hemoglobin: 12.8 g/dL (ref 12.0–15.0)
Immature Granulocytes: 1 %
Lymphocytes Relative: 17 %
Lymphs Abs: 0.9 10*3/uL (ref 0.7–4.0)
MCH: 28.4 pg (ref 26.0–34.0)
MCHC: 32.8 g/dL (ref 30.0–36.0)
MCV: 86.5 fL (ref 80.0–100.0)
Monocytes Absolute: 0.6 10*3/uL (ref 0.1–1.0)
Monocytes Relative: 12 %
Neutro Abs: 3.6 10*3/uL (ref 1.7–7.7)
Neutrophils Relative %: 70 %
Platelets: 205 10*3/uL (ref 150–400)
RBC: 4.51 MIL/uL (ref 3.87–5.11)
RDW: 12.1 % (ref 11.5–15.5)
WBC: 5.1 10*3/uL (ref 4.0–10.5)
nRBC: 0 % (ref 0.0–0.2)

## 2020-09-25 LAB — GLUCOSE, CAPILLARY
Glucose-Capillary: 215 mg/dL — ABNORMAL HIGH (ref 70–99)
Glucose-Capillary: 149 mg/dL — ABNORMAL HIGH (ref 70–99)
Glucose-Capillary: 313 mg/dL — ABNORMAL HIGH (ref 70–99)
Glucose-Capillary: 208 mg/dL — ABNORMAL HIGH (ref 70–99)
Glucose-Capillary: 248 mg/dL — ABNORMAL HIGH (ref 70–99)

## 2020-09-25 LAB — MAGNESIUM: Magnesium: 2.1 mg/dL (ref 1.7–2.4)

## 2020-09-25 LAB — C-REACTIVE PROTEIN: CRP: 2.4 mg/dL — ABNORMAL HIGH (ref <1.0)

## 2020-09-25 LAB — D-DIMER, QUANTITATIVE: D-Dimer, Quant: 1.25 ug/mL-FEU — ABNORMAL HIGH (ref 0.00–0.50)

## 2020-09-25 MED ORDER — MELATONIN 3 MG PO TABS
3.0000 mg | ORAL_TABLET | Freq: Every evening | ORAL | Status: DC | PRN
  Administered 2020-09-26: 3 mg via ORAL
  Filled 2020-09-25: qty 1

## 2020-09-25 MED ORDER — CITALOPRAM HYDROBROMIDE 10 MG PO TABS
10.0000 mg | ORAL_TABLET | Freq: Every day | ORAL | Status: DC
  Administered 2020-09-25 – 2020-09-28 (×4): 10 mg via ORAL
  Filled 2020-09-25 (×4): qty 1

## 2020-09-25 NOTE — Plan of Care (Signed)
  Problem: Respiratory: Goal: Complications related to the disease process, condition or treatment will be avoided or minimized Outcome: Progressing   Problem: Respiratory: Goal: Will maintain a patent airway Outcome: Progressing   Problem: Coping: Goal: Psychosocial and spiritual needs will be supported Outcome: Progressing   Problem: Coping: Goal: Psychosocial and spiritual needs will be supported Outcome: Progressing   Problem: Coping: Goal: Psychosocial and spiritual needs will be supported Outcome: Progressing   Problem: Education: Goal: Knowledge of risk factors and measures for prevention of condition will improve Outcome: Progressing

## 2020-09-25 NOTE — Progress Notes (Addendum)
Family Medicine Teaching Service Daily Progress Note Intern Pager: [phone]  Patient name: Jacqueline Goodman Medical record number: [id number] Date of birth: [birth year] Age: 70 y.o. Gender: female  Primary Care Provider: Lind Covert, MD Consultants: Nutrition Code Status: Full  Pt Overview and Major Events to Date:  1/29-admitted for FTT, COVID-19 positive  Assessment and Plan: Jacqueline Goodman is a 70 y.o. female presenting with failure to thrive and found to be COVID-19 positive. PMH is significant for hyperlipidemia, hypertension, type 2 diabetes  Failure to thrive  weakness  fatigue-likely related to Covid infection. Appears to be stable status from yesterday. Passed bedside swallow screen. Need to speak to family member to find baseline. Concern for stroke still remains although no focal findings on exam and could be contributed to advanced dementia. PT/OT recommend SNF vs 24 hour supervision. Daughter requested head CT as she had a family member who had a stroke that was missed on initial presentation.  Head CT was performed and showed no acute abnormality but moderate diffuse cerebral and cerebral atrophy. -mIV if not tolerating PO -We will update daughter daily and discuss bringing on palliative for goals of care. -After discussion with the patient's daughter will bring palliative on board to discuss goals of care - TOC for placement   Mild Covid-19- stable.  Continues on room air. Not vaccinated. -Monitor O2, maintain above 90%.   -continue Remdesivir  AKI- improved. Cr 1.17 today.  -Avoid nephrotoxic agents -A.m. BMP  Hx elevated LFTs- improved today. AST 66>64, ALT 53>49 -A.m. CMP  Protein calorie malnutrition- stable. Albumin 2.4 today -Nutrition consult -Consider Ensure or other no replacement substitutes  Hyperlipidemia- chronic, stable -Continue home Lipitor  Type 2 diabetes- chronic, moderately well controlled. On latus 20 and required 14 units  fast acting insulin over last 24 hours. - Continue lantus 20 in setting of poor PO intake, can consider increasing as patient's PO intake increases. -Sensitive sliding scale with meals and bedtime - discontinue home glimepiride - continue home Ozempic  Hypertension- well controlled. BP 133/78 today -Hold home losartan in setting of AKI  FEN/GI: carb modified diet Prophylaxis: Lovenox Status is: Observation  The patient will require care spanning > 2 midnights and should be moved to inpatient because: Unsafe d/c plan  Dispo: The patient is from: Home              Anticipated d/c is to: SNF              Anticipated d/c date is: 3 days              Patient currently is not medically stable to d/c.   Difficult to place patient No  Subjective:  Patient with no complaints this morning.  She is oriented to person and president but not place, time, situation.  She seemed surprised that I told her that she had COVID-19 even though I had had a similar discussion with her on her admission.  Patient continues to breathe well on room air.  Objective: Temp:  [97.8 F (36.6 C)-99.4 F (37.4 C)] 98 F (36.7 C) (01/30 0401) Pulse Rate:  [82-86] 82 (01/30 0401) Resp:  [18-21] 21 (01/30 0401) BP: (112-148)/(73-84) 148/81 (01/30 0401) SpO2:  [94 %-97 %] 96 % (01/30 0401) Weight:  [419 kg] 113 kg (01/29 0800) Physical Exam: General: Alert and oriented to person and president but not place, time, situation.  No apparent distress Heart: S1, S2 no murmurs appreciated Lungs: Fine crackles present in  bilateral bases, no respiratory distress.  Breathing well on room air. Abdomen: Bowel sounds present, no abdominal pain to palpation Skin: Warm and dry Extremities: Trace lower extremity edema  Laboratory: Recent Labs  Lab 09/23/20 1359 09/23/20 1509 09/24/20 0222 09/25/20 0025  WBC 6.5  --  6.4 5.1  HGB 13.3 13.3 12.6 12.8  HCT 39.4 39.0 36.5 39.0  PLT 192  --  185 205   Recent Labs  Lab  09/23/20 1610 09/24/20 0222 09/25/20 0025  NA 136 135 136  K 4.4 3.9 3.7  CL 102 103 102  CO2 24 23 22   BUN 51* 46* 49*  CREATININE 1.35* 1.22* 1.17*  CALCIUM 8.5* 7.9* 8.2*  PROT 6.2* 5.8* 5.6*  BILITOT 0.6 0.8 0.3  ALKPHOS 71 69 65  ALT 58* 53* 49*  AST 73* 66* 64*  GLUCOSE 269* 221* 312*   TSH wnl Mg 2.1  Imaging/Diagnostic Tests: CT HEAD WO CONTRAST  Result Date: 09/24/2020 CLINICAL DATA:  Altered mental status.  COVID-19 positive. EXAM: CT HEAD WITHOUT CONTRAST TECHNIQUE: Contiguous axial images were obtained from the base of the skull through the vertex without intravenous contrast. COMPARISON:  Head CT report dated 01/28/2002 FINDINGS: Brain: Moderately enlarged ventricles and subarachnoid spaces. Mild-to-moderate patchy white matter low density in both cerebral hemispheres. No intracranial hemorrhage, mass lesion or CT evidence of acute infarction. Vascular: No hyperdense vessel or unexpected calcification. Skull: Normal. Negative for fracture or focal lesion. Sinuses/Orbits: Status post bilateral cataract extraction. Marked left maxillary sinus mucosal thickening and completely opacified right maxillary sinus with soft tissue density extending through the medial wall of the sinus into the nasal cavity. Other: None. IMPRESSION: 1. No acute abnormality. 2. Moderate diffuse cerebral and cerebellar atrophy. 3. Mild to moderate chronic small vessel white matter ischemic changes in both cerebral hemispheres. 4. 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 Electronically Signed   By: Claudie Revering M.D.   On: 09/24/2020 14:27   DG Chest Port 1 View  Result Date: 09/23/2020 CLINICAL DATA:  Short of breath EXAM: PORTABLE CHEST 1 VIEW COMPARISON:  09/09/2017 FINDINGS: Mild elevation right hemidiaphragm unchanged. Heart size upper normal. Negative for heart failure. Lungs are clear without infiltrate or effusion. IMPRESSION: No active disease.  Electronically Signed   By: Franchot Gallo M.D.   On: 09/23/2020 14:26     Lurline Del, DO 09/25/2020, 6:54 AM PGY-2, [place] Intern pager: [phone], text pages welcome

## 2020-09-25 NOTE — Consult Note (Signed)
Palliative Medicine Inpatient Consult Note  Reason for consult:  Goals of Care  HPI:  Per intake H&P --> Jacqueline P Justiceis a 70 y.o.femalepresenting with failure to thrive and found to be COVID-19 positive. PMH is significant forhyperlipidemia, hypertension, type 2 diabetes.  Palliative care was asked to get involved in the setting of ongoing failure to thrive to discuss goals of care.  Clinical Assessment/Goals of Care:  *Please note that this is a verbal dictation therefore any spelling or grammatical errors are due to the "[facility] [employer]" system interpretation.  I have reviewed medical records including EPIC notes, labs and imaging, received report from bedside RN, assessed the patient who is quite disoriented and did not know where she was at the time of evaluation. She was noted to have defecated and have remnants of that on her fingernails yelling "what happened".   I called Jacqueline Goodman (daughter) to further discuss diagnosis prognosis, GOC, EOL wishes, disposition and options.   I introduced Palliative Medicine as specialized medical care for people living with serious illness. It focuses on providing relief from the symptoms and stress of a serious illness. The goal is to improve quality of life for both the patient and the family.  Jacqueline Goodman shares with me that her mother was "born in bred" in [place], [place]. She has been married to her husband Jacqueline Goodman for almost 10 years now. She and Jacqueline Goodman should share 2 children together a daughter, Jacqueline Goodman and a son, Jacqueline Goodman. Jacqueline Goodman also has an adopted daughter Jacqueline Goodman. At the was a very bright woman who used to work as a double entry bookkeeper she also worked at a [employer]. She is noted to use to have enjoyed reading though with her diabetes she is suffered retinopathy causing her ability to read to be quite impaired. She also enjoys watching television. Jacqueline Goodman is woman of faith and practices within the [facility]  denomination.  From a physical perspective Jacqueline Goodman has been fairly limited since about 2018 per her daughter. Her daughter recounts that she had a "tummy tuck" and since then things have gone self. Jacqueline Goodman also lost her companion dog in 2019 which is also noted to have been a very hard loss. She later was diagnosed with vertigo which has caused her to be more home dependent.  From an activities of daily living perspective Jacqueline Goodman shares that Jacqueline Goodman regularly bathes herself and does feel that there is a component of depression associated with this. Jacqueline Goodman tends to wake up walk to the kitchen eat breakfast and then sit in her chair all throughout the day aside from going to the restroom and eating she is very rarely out of her chair.  He did complete a gait and balance assessment of Jacqueline Goodman and she was noted to have exceptionally poor quadricep strength.  She required assistance from sitting to standing standby assistance with a front wheel walker to pivot to the chair.  From a nutritional standpoint Jacqueline Goodman has a very poor diet. Her daughter states that she usually has a few pieces of bacon in the morning and 2 cinnamon swirled toast slices which is identified as her healthiest meal of the day. During lunchtime she has a banana and an orange. For dinner she typically consumes 1 egg. Jacqueline Goodman states that due to Jacqueline Goodman's inactivity and very poor nutrition she has noted that her muscles have become far more weakened. Jacqueline Goodman states that it would be "wonderful" if Jacqueline Goodman could mobilize the way that she wants to though there  is concern about her overall motivation in the setting of possible depression.  Jacqueline Goodman goes on to tell me that she has believe for some time now that Jacqueline Goodman has exemplified signs of dementia. She recounts several occasions where Jacqueline Goodman has asked about family members who have been dead for a number of years. She also at times can be very infantile which is something that she was not normally like in her  younger years. Most recently Jacqueline Goodman notes that many bills that Jacqueline Goodman would normally play without issue had not been paid. We further went on to discuss the various types of dementia and how these are diagnosed. We focused a lot of our conversation on vascular dementia given Jacqueline Goodman's past medical history. We did also discuss other dementia such as Alzheimer's dementia and I brought up the idea of neuropsychiatric testing if she gets to a point where she is more stable for diagnosis confirmation.  A detailed discussion was had today regarding advanced directives filled does not have any of these on file though her husband daughter and son all make decisions pertaining to her care uniformly.    Concepts specific to code status, artifical feeding and hydration, continued IV antibiotics and rehospitalization was had.  I shared with Jacqueline Goodman that Jacqueline Goodman if she truly did undergo a cardiopulmonary resuscitation would likely suffer a multitude of deficits and would not be the same person that she knew before such an event.  Jacqueline Goodman and her brother have talked about this in the past and they both agree that if Jacqueline Goodman's quality of life is compromised they would not want extraneous measures to be taken.  I provided her with the "hard choices for loving people" book as well as a copy of the MOST form.  I have requested that she review these things so that we may complete them and I strongly recommended DNAR/DNI CODE STATUS. We also discussed the topic of tube feedings which in Jacqueline Goodman case is likely to case her more distress given her delirium. We also know that patient with underlying dementia do not benefit from feeding tubes in terms of their quality of life.  The difference between a aggressive medical intervention path  and a palliative comfort care path for this patient at this time was had.  We discussed the goal of  Jacqueline Goodman going to rehabilitation to gain strength.  We reviewed that if she continues to decline and/or  deteriorate moving forward in the setting of post Covid then consideration of alternative modalities of support like hospice would not be unreasonable.  We talked about treating at feels depression, asking dietary to get involved for her malnutrition state, working with PT OT for rehabilitation, and aiding in delirium precautions.  Jacqueline Goodman is in agreement with having an outpatient palliative provider involved to continue goals of care conversations.  Discussed the importance of continued conversation with family and their  medical providers regarding overall plan of care and treatment options, ensuring decisions are within the context of the patients values and GOCs.  Decision Maker: Jacqueline Goodman (daughter) [phone]  SUMMARY OF RECOMMENDATIONS   Full Code/ Full Scope of care for the time being --> Strongly recommended DNAR/DNI code status  TOC - OP Palliative Care  Patients daughter would like for her to go to skilled nursing and back home  Patient would benefit from neurpsyc testing for dementia diagnosis  Spiritual Support  Ongoing PMT conversations  Code Status/Advance Care Planning: FULL CODE   Symptom Management:  Depression:  - Citalopram 10mg   PO QHS  Malnutrition:  - Appreciate dietary consult  Muscular Weakness:                 - Physical Therapy Evaluation                 - Occupational Therapy Evaluation   Delirium:                 - Delirium precautions                 - Get up during the day                 - Encourage a familiar face to remain present throughout the day                 - Keep blinds open and lights on during daylight hours                 - Minimize the use of opioids/benzodiazepines   Falls: - Physical activity - Ensure wearing eyeglasses or contact lenses - Ensure wearing hearing aid - Find out about the side effects of any medicine you take - Ensure adequate sleep - Stand up slowly - Use an assistive devices  - Wear non-skid,  rubber-soled, low-heeled shoes, or lace-up shoes with non-skid soles that fully support feet.    Palliative Prophylaxis:   Oral Care, Mobility, Delirium  Additional Recommendations (Limitations, Scope, Preferences): Continue current measures   Psycho-social/Spiritual:   Desire for further Chaplaincy support: Yes  Additional Recommendations: Continue current scope of care   Prognosis: Unclear - moderate frailty and malnutrition though how much of this is related to depression is quesionable  Discharge Planning: Discharge to SNF when medically optimized with OP Palliative support.  Vitals:   09/25/20 0739 09/25/20 1243  BP: (!) 149/83 (!) 159/79  Pulse: 76   Resp: 20 18  Temp: 98 F (36.7 C) 98.9 F (37.2 C)  SpO2: 96% 95%    Intake/Output Summary (Last 24 hours) at 09/25/2020 1408 Last data filed at 09/25/2020 1300 Gross per 24 hour  Intake 1537 ml  Output 1700 ml  Net -163 ml   Last Weight  Most recent update: 09/24/2020  8:27 AM   Weight  113 kg (249 lb 1.9 oz)           Gen:  Causacian older F in distress HEENT: moist mucous membranes CV: Regular rate and rhythm  PULM: On RA, diffuse rhonchi throughout ABD: soft/nontender (+) abdominal scarring below umbilicus EXT: No edema  Neuro: Disoriented to place, time, and situation  PPS: 20%   This conversation/these recommendations were discussed with the FMT.  Time In: 1520 Time Out: 1705 Total Time: 105 Greater than 50%  of this time was spent counseling and coordinating care related to the above assessment and plan.  [facility] Team Team Cell Phone: [phone] Please utilize secure chat with additional questions, if there is no response within 30 minutes please call the above phone number  Palliative Medicine Team providers are available by phone from 7am to 7pm daily and can be reached through the team cell phone.  Should this patient require assistance outside of  these hours, please call the patient's attending physician.

## 2020-09-26 ENCOUNTER — Telehealth: Payer: Self-pay

## 2020-09-26 DIAGNOSIS — F339 Major depressive disorder, recurrent, unspecified: Secondary | ICD-10-CM

## 2020-09-26 DIAGNOSIS — F0391 Unspecified dementia with behavioral disturbance: Secondary | ICD-10-CM

## 2020-09-26 DIAGNOSIS — R410 Disorientation, unspecified: Secondary | ICD-10-CM

## 2020-09-26 DIAGNOSIS — U071 COVID-19: Principal | ICD-10-CM

## 2020-09-26 DIAGNOSIS — F03918 Unspecified dementia, unspecified severity, with other behavioral disturbance: Secondary | ICD-10-CM

## 2020-09-26 LAB — CBC WITH DIFFERENTIAL/PLATELET
Abs Immature Granulocytes: 0.05 10*3/uL (ref 0.00–0.07)
Basophils Absolute: 0 10*3/uL (ref 0.0–0.1)
Basophils Relative: 0 %
Eosinophils Absolute: 0 10*3/uL (ref 0.0–0.5)
Eosinophils Relative: 0 %
HCT: 36.2 % (ref 36.0–46.0)
Hemoglobin: 13 g/dL (ref 12.0–15.0)
Immature Granulocytes: 1 %
Lymphocytes Relative: 16 %
Lymphs Abs: 1.2 10*3/uL (ref 0.7–4.0)
MCH: 29.7 pg (ref 26.0–34.0)
MCHC: 35.9 g/dL (ref 30.0–36.0)
MCV: 82.6 fL (ref 80.0–100.0)
Monocytes Absolute: 0.6 10*3/uL (ref 0.1–1.0)
Monocytes Relative: 8 %
Neutro Abs: 5.6 10*3/uL (ref 1.7–7.7)
Neutrophils Relative %: 75 %
Platelets: 228 10*3/uL (ref 150–400)
RBC: 4.38 MIL/uL (ref 3.87–5.11)
RDW: 12.1 % (ref 11.5–15.5)
WBC: 7.5 10*3/uL (ref 4.0–10.5)
nRBC: 0 % (ref 0.0–0.2)

## 2020-09-26 LAB — COMPREHENSIVE METABOLIC PANEL
ALT: 46 U/L — ABNORMAL HIGH (ref 0–44)
AST: 59 U/L — ABNORMAL HIGH (ref 15–41)
Albumin: 2.4 g/dL — ABNORMAL LOW (ref 3.5–5.0)
Alkaline Phosphatase: 67 U/L (ref 38–126)
Anion gap: 12 (ref 5–15)
BUN: 46 mg/dL — ABNORMAL HIGH (ref 8–23)
CO2: 21 mmol/L — ABNORMAL LOW (ref 22–32)
Calcium: 8.5 mg/dL — ABNORMAL LOW (ref 8.9–10.3)
Chloride: 105 mmol/L (ref 98–111)
Creatinine, Ser: 1 mg/dL (ref 0.44–1.00)
GFR, Estimated: >60 mL/min (ref >60)
Glucose, Bld: 118 mg/dL — ABNORMAL HIGH (ref 70–99)
Potassium: 3.7 mmol/L (ref 3.5–5.1)
Sodium: 138 mmol/L (ref 135–145)
Total Bilirubin: 0.9 mg/dL (ref 0.3–1.2)
Total Protein: 5.7 g/dL — ABNORMAL LOW (ref 6.5–8.1)

## 2020-09-26 LAB — GLUCOSE, CAPILLARY
Glucose-Capillary: 114 mg/dL — ABNORMAL HIGH (ref 70–99)
Glucose-Capillary: 147 mg/dL — ABNORMAL HIGH (ref 70–99)
Glucose-Capillary: 164 mg/dL — ABNORMAL HIGH (ref 70–99)
Glucose-Capillary: 171 mg/dL — ABNORMAL HIGH (ref 70–99)
Glucose-Capillary: 194 mg/dL — ABNORMAL HIGH (ref 70–99)
Glucose-Capillary: 289 mg/dL — ABNORMAL HIGH (ref 70–99)
Glucose-Capillary: 315 mg/dL — ABNORMAL HIGH (ref 70–99)

## 2020-09-26 LAB — C-REACTIVE PROTEIN: CRP: 1.3 mg/dL — ABNORMAL HIGH (ref <1.0)

## 2020-09-26 LAB — MAGNESIUM: Magnesium: 1.8 mg/dL (ref 1.7–2.4)

## 2020-09-26 LAB — D-DIMER, QUANTITATIVE: D-Dimer, Quant: 1.05 ug/mL-FEU — ABNORMAL HIGH (ref 0.00–0.50)

## 2020-09-26 MED ORDER — ACETAMINOPHEN 325 MG PO TABS
650.0000 mg | ORAL_TABLET | Freq: Three times a day (TID) | ORAL | Status: DC
  Administered 2020-09-27 – 2020-09-28 (×5): 650 mg via ORAL
  Filled 2020-09-26 (×6): qty 2

## 2020-09-26 MED ORDER — PROSOURCE PLUS PO LIQD
30.0000 mL | Freq: Two times a day (BID) | ORAL | Status: DC
  Administered 2020-09-27 – 2020-09-28 (×4): 30 mL via ORAL
  Filled 2020-09-26 (×5): qty 30

## 2020-09-26 MED ORDER — MELATONIN 3 MG PO TABS
3.0000 mg | ORAL_TABLET | Freq: Every day | ORAL | Status: DC
Start: 2020-09-26 — End: 2020-09-29
  Administered 2020-09-26 – 2020-09-27 (×2): 3 mg via ORAL
  Filled 2020-09-26 (×2): qty 1

## 2020-09-26 MED ORDER — FAMOTIDINE 20 MG PO TABS
20.0000 mg | ORAL_TABLET | Freq: Every day | ORAL | Status: DC
  Administered 2020-09-26 – 2020-09-27 (×2): 20 mg via ORAL
  Filled 2020-09-26 (×2): qty 1

## 2020-09-26 MED ORDER — HALOPERIDOL LACTATE 5 MG/ML IJ SOLN
2.0000 mg | Freq: Four times a day (QID) | INTRAMUSCULAR | Status: DC | PRN
  Administered 2020-09-26 – 2020-09-27 (×3): 2 mg via INTRAVENOUS
  Filled 2020-09-26 (×3): qty 1

## 2020-09-26 NOTE — Progress Notes (Signed)
Family Medicine Teaching Service Attending Note  I interviewed and examined patient Jacqueline Goodman and reviewed their tests and x-rays.  I discussed with Dr. Manus Rudd and reviewed their note for today.  I agree with their assessment and plan.     Additionally  She was sleepy but conversant. Appeared confused at first when saw me but did remember my name.  Could not say how long has been in the hospital or why or the year.  Spoke with her daughter who relates she has been having worsening memory for the last year or two.  Also has been more labile emotionally   Mental Status - slowly worsening dementia with acute covid and delirium.  No evidence of reversible infection or electrolyte imbalance.    Family does not want learn term nursing home but agree with at least short to mid term NH.

## 2020-09-26 NOTE — Telephone Encounter (Signed)
Patients daughter calls nurse line upset her mother is being discharged. Daughter reports she does not have a clear understanding of what is going on. Daughter reports she is not ready to be discharged, however that is what "someone" to her. Will forward to PCP as she would like a call from him.

## 2020-09-26 NOTE — Progress Notes (Addendum)
Nutrition Follow-up  DOCUMENTATION CODES:   Not applicable  INTERVENTION:   Magic cup TID with meals, each supplement provides 290 kcal and 9 grams of protein  72m Prosource Plus po BID, each supplement provides 100 kcals and 15 grams of protein  Continue Ensure Enlive po TID, each supplement provides 350 kcal and 20 grams of protein   NUTRITION DIAGNOSIS:   Increased nutrient needs related to acute illness (COVID infection) as evidenced by estimated needs.  ongoing  GOAL:   Patient will meet greater than or equal to 90% of their needs  Not met  MONITOR:   PO intake,Supplement acceptance,Weight trends,I & O's,Labs  REASON FOR ASSESSMENT:   Consult Assessment of nutrition requirement/status  ASSESSMENT:   Patient with PMH significant for DM, HTN, and HLD. Presents this admission with FTT and COVID infection.  PMT consulted RD to assess pt. Per DO, pt's head CT suggests pt had baseline dementia for several years and it is now age-advanced. DO noted pt may also have COVID-19 related neurocognitive decline that can significantly impact her prognosis for recovery. GWyandanchdiscussions ongoing.   Pt unavailable at time of RD visit. Per RN, pt has been yelling out and has not been able to rest or get comfortable.   Pt with very poor po intake since last RD assessment/admit. Only 3 meals documented with 10% meal completion each meal. Pt with orders for Ensure TID and is typically consuming them well per RN; however, pt did decline supplement this afternoon. Note that none of these meals were from today, and MD reported pt with good appetite today.   Reviewed weight history. No significant weight changes noted.   UOP: [id number] hours  Labs: CBGs [id number]: pepcid, ss novolog Q4H, 20 units lantus daily, mvi with minerals daily  NUTRITION - FOCUSED PHYSICAL EXAM:  Unable to perform at this time. Will attempt at follow-up.   Diet Order:   Diet Order             Diet Carb Modified Fluid consistency: Thin; Room service appropriate? Yes  Diet effective now                 EDUCATION NEEDS:   Education needs have been addressed  Skin:  Skin Assessment: Skin Integrity Issues: Skin Integrity Issues:: Other (Comment) Other: MASD- breast  Last BM:  09/25/20  Height:   Ht Readings from Last 1 Encounters:  04/26/20 5' 8.5" (1.74 m)    Weight:   Wt Readings from Last 1 Encounters:  09/24/20 113 kg   BMI:  Body mass index is 37.33 kg/m.  Estimated Nutritional Needs:   Kcal:  1800-2000 kcal  Protein:  90-105 grams  Fluid:  >/= 1.8 L/day   AmLarkin InaMS, RD, LDN RD pager number and weekend/on-call pager number located in [place]

## 2020-09-26 NOTE — Progress Notes (Signed)
Inpatient Diabetes Program Recommendations  AACE/ADA: New Consensus Statement on Inpatient Glycemic Control   Target Ranges:  Prepandial:   less than 140 mg/dL      Peak postprandial:   less than 180 mg/dL (1-2 hours)      Critically ill patients:  140 - 180 mg/dL   Results for SHERIDAN, HEW (MRN [id number]) as of 09/26/2020 10:15  Ref. Range 09/25/2020 07:35 09/25/2020 12:38 09/25/2020 17:09 09/25/2020 20:08 09/26/2020 00:46 09/26/2020 04:42 09/26/2020 08:07  Glucose-Capillary Latest Ref Range: 70 - 99 mg/dL 149 (H)  Novolog 1 unit  Lantus 20 units 313 (H)  Novolog 7 units 208 (H)  Novolog 3 units 248 (H)  Novolog 3 units 315 (H)  Novolog 7 units 194 (H)  Novolog 2 units 114 (H)      Lantus 20 units @9 :21   Review of Glycemic Control  Diabetes history: DM2 Outpatient Diabetes medications: Tresiba 60 units daily, Amaryl 2 mg QAM, Ozempic 0.25 mg Qweek (Monday)  Current orders for Inpatient glycemic control: Lantus 20 units daily, Novolog 0-9 units Q4H  Inpatient Diabetes Program Recommendations:    Insulin: Please consider increasing Lantus to 25 units daily.  Thanks, Barnie Alderman, RN, MSN, CDE Diabetes Coordinator Inpatient Diabetes Program [phone] (Team Pager from 8am to 5pm)

## 2020-09-26 NOTE — Progress Notes (Signed)
Family Medicine Teaching Service Daily Progress Note Intern Pager: [phone]  Patient name: Jacqueline Goodman Medical record number: [id number] Date of birth: Jul 28, 1951 Age: 70 y.o. Gender: female  Primary Care Provider: Lind Covert, MD Consultants: Nutrition Code Status: Full  Pt Overview and Major Events to Date:  1/29-admitted for FTT, COVID-19 positive  Assessment and Plan: [name] a 70 y.o.femalepresenting with failure to thrive and found to be COVID-19 positive. PMH is significant forhyperlipidemia, hypertension, type 2 diabetes  Failure to thrive weakness  fatigue- Lkely related to Covid infection.   Imporved from yesterday.   Mentation improved and Alert and Oriented x2 to person and place but not time.  Energy has improved and more conversational today.  Likely also has underlying dementia and may be back to baseline. PT/OT recommend SNF vs 24 hour supervision.  Tolerating good PO.  Well perfused.   Daughter and family updated and having ongoing discussion with Palliative about goals of care.  Family also discussing SNF vs 24 hour supervision at home.  ToC also working on SNF placement with COVID + in mean time. - Palliative followingAppreciate recs - We will update daughter daily a - Continue goals of Care discussion and SNF placement vs  - TOC for placement   Mild Covid-19- stable.  Continues on room air. Not vaccinated. -Monitor O2, maintain above 90%. -continue Remdesivir, day 4/5  AKI- improved. Cr 1.17 yesterday. -Avoid nephrotoxic agents -A.m. BMP  Hx elevated LFTs- improved yesterday. AST 66>64, ALT 53>49.  No new labs this AM. -A.m. CMP  Protein calorie malnutrition- stable. Albumin 2.4 today -Nutrition consult -Consider Ensure or other no replacement substitutes  Hyperlipidemia- chronic, stable -Continue home Lipitor  Type 2 diabetes- chronic, moderately well controlled. On latus 20 and required 14 units fast acting insulin  over last 24 hours. - Continue lantus 20 in setting of poor PO intake, can consider increasing as patient's PO intake increases. -Sensitive sliding scale with meals and bedtime - discontinuehome glimepiride - continue home Ozempic  Hypertension- Mildly elevated. BP 140- today -Holdhome losartanin setting of AKI  FEN/GI: Carb modified PPx: Lovenox   Status is: Inpatient  Remains inpatient appropriate because:Altered mental status   Dispo: The patient is from: Home              Anticipated d/c is to: SNF              Anticipated d/c date is: 3 days              Patient currently is medically stable to d/c.   Difficult to place patient No    Subjective:  Patient reports feeling well today.  Still expresses confusion about how she contracted COVID infection.  Objective: Temp:  [98 F (36.7 C)-98.9 F (37.2 C)] 98 F (36.7 C) (01/31 0300) Pulse Rate:  [76-89] 89 (01/31 0017) Resp:  [17-20] 18 (01/31 0300) BP: (146-159)/(70-95) 151/95 (01/31 0300) SpO2:  [95 %-99 %] 97 % (01/31 0300)  Physical Exam:  Physical Exam HENT:     Head: Normocephalic and atraumatic.     Mouth/Throat:     Mouth: Mucous membranes are moist.  Cardiovascular:     Rate and Rhythm: Normal rate and regular rhythm.     Pulses: Normal pulses.  Pulmonary:     Effort: Pulmonary effort is normal.     Breath sounds: Normal breath sounds. No wheezing, rhonchi or rales.  Abdominal:     General: Abdomen is flat.  Palpations: Abdomen is soft.  Musculoskeletal:        General: No tenderness.     Right lower leg: No edema.     Left lower leg: No edema.  Skin:    General: Skin is warm.     Capillary Refill: Capillary refill takes less than 2 seconds.  Neurological:     General: No focal deficit present.     Mental Status: She is alert.     Laboratory: Recent Labs  Lab 09/23/20 1359 09/23/20 1509 09/24/20 0222 09/25/20 0025  WBC 6.5  --  6.4 5.1  HGB 13.3 13.3 12.6 12.8  HCT 39.4  39.0 36.5 39.0  PLT 192  --  185 205   Recent Labs  Lab 09/23/20 1610 09/24/20 0222 09/25/20 0025  NA 136 135 136  K 4.4 3.9 3.7  CL 102 103 102  CO2 24 23 22   BUN 51* 46* 49*  CREATININE 1.35* 1.22* 1.17*  CALCIUM 8.5* 7.9* 8.2*  PROT 6.2* 5.8* 5.6*  BILITOT 0.6 0.8 0.3  ALKPHOS 71 69 65  ALT 58* 53* 49*  AST 73* 66* 64*  GLUCOSE 269* 221* 312*     Imaging/Diagnostic Tests: No new imaging   Delora Fuel, MD 09/26/2020, 7:20 AM PGY-1, [place] Intern pager: [phone], text pages welcome

## 2020-09-26 NOTE — Progress Notes (Signed)
Occupational Therapy Treatment Patient Details Name: Jacqueline Goodman MRN: [id number] DOB: Jul 16, 1951 Today's Date: 09/26/2020    History of present illness 70 y.o. female presenting with failure to thrive and found to be COVID-19 positive. PMH is significant for hyperlipidemia, hypertension, type 2 diabetes.   OT comments  Pt making steady progress towards OT goals this session. Pt continues to present with cognitive impairments, decreased activity tolerance and generalized deconditioning impacting pts ability to complete BADLs. Pt was able to sit <>stand with MOD A +2 this session .Pt required step by step cues to sequence all mobility tasks. Pt reports dizziness upon standing needing to return to sitting, however pt reports dizziness resolves once sitting. Pt declined further functional mobility/ ADLs once sitting EOB. Pt present with cognitive impairments in the areas of orientation, attention, memory, following commands, Safety/judgement, Awareness and Problem solving. Pt would continue to benefit from skilled occupational therapy while admitted and after d/c to address the below listed limitations in order to improve overall functional mobility and facilitate independence with BADL participation. DC plan remains appropriate, will follow acutely per POC.     Follow Up Recommendations  SNF;Supervision/Assistance - 24 hour    Equipment Recommendations  3 in 1 bedside commode;Wheelchair (measurements OT);Wheelchair cushion (measurements OT)    Recommendations for Other Services      Precautions / Restrictions Precautions Precautions: Fall Restrictions Weight Bearing Restrictions: No       Mobility Bed Mobility Overal bed mobility: Needs Assistance Bed Mobility: Supine to Sit;Sit to Supine     Supine to sit: Max assist;+2 for physical assistance;HOB elevated Sit to supine: Max assist;HOB elevated   General bed mobility comments: MAX A +2 to transiton from supine>sitting needing  tactile, step by step cues to transition to EOB. MAX A +1 to return to supine needing assist just to return BLEs to supine  Transfers Overall transfer level: Needs assistance Equipment used: Rolling walker (2 wheeled) Transfers: Sit to/from Stand Sit to Stand: Mod assist;+2 physical assistance;From elevated surface         General transfer comment: MOD A +2 to power into standign from EOB with elevated EOB,cues for hand placement and tactile cues to facilitate full hip extension and elevate trunk    Balance Overall balance assessment: Needs assistance Sitting-balance support: Bilateral upper extremity supported;Feet supported Sitting balance-Leahy Scale: Poor Sitting balance - Comments: pt able to sit EOB with BUE supported with close supervision   Standing balance support: Bilateral upper extremity supported Standing balance-Leahy Scale: Poor Standing balance comment: reliant on BUE support and external assist                           ADL either performed or assessed with clinical judgement   ADL Overall ADL's : Needs assistance/impaired                         Toilet Transfer: Moderate assistance;+2 for physical assistance Toilet Transfer Details (indicate cue type and reason): sit<>stand only         Functional mobility during ADLs: Moderate assistance;Maximal assistance;+2 for physical assistance (bed mobility and sit<>stand only) General ADL Comments: pt able to sit<>stand this session with RW and MOD A +2. pt continues to present with cognitive impairments, decreased activity tolerance and impaired ability to care for self     Vision       Perception     Praxis  Cognition Arousal/Alertness: Awake/alert Behavior During Therapy: Anxious;Restless Overall Cognitive Status: Impaired/Different from baseline Area of Impairment: Orientation;Attention;Memory;Following commands;Safety/judgement;Awareness;Problem solving                  Orientation Level: Disoriented to;Time;Situation (states its sept 19 th and unaware of location) Current Attention Level: Selective Memory: Decreased short-term memory (unablel to recall correct answer to orientation questions stated in beginning of session) Following Commands: Follows one step commands inconsistently;Follows one step commands with increased time Safety/Judgement: Decreased awareness of safety;Decreased awareness of deficits (unaware she had COVID) Awareness: Intellectual Problem Solving: Slow processing;Decreased initiation;Difficulty sequencing;Requires verbal cues;Requires tactile cues General Comments: pt able to state name and birthday but unable to state location or date. unable to recall correct location at end of session. pt requried tactile cues and increased time to follow commands related to mobility        Exercises     Shoulder Instructions       General Comments VSS    Pertinent Vitals/ Pain       Pain Assessment: No/denies pain  Home Living                                          Prior Functioning/Environment              Frequency  Min 2X/week        Progress Toward Goals  OT Goals(current goals can now be found in the care plan section)  Progress towards OT goals: Progressing toward goals  Acute Rehab OT Goals Time For Goal Achievement: 10/08/20 Potential to Achieve Goals: Good  Plan Discharge plan remains appropriate;Frequency remains appropriate    Co-evaluation                 AM-PAC OT "6 Clicks" Daily Activity     Outcome Measure   Help from another person eating meals?: A Little Help from another person taking care of personal grooming?: A Little Help from another person toileting, which includes using toliet, bedpan, or urinal?: A Lot Help from another person bathing (including washing, rinsing, drying)?: A Lot Help from another person to put on and taking off regular upper body clothing?: A  Little Help from another person to put on and taking off regular lower body clothing?: A Lot 6 Click Score: 15    End of Session Equipment Utilized During Treatment: Gait belt;Rolling walker  OT Visit Diagnosis: Unsteadiness on feet (R26.81);Muscle weakness (generalized) (M62.81);Other symptoms and signs involving cognitive function;Hemiplegia and hemiparesis Hemiplegia - Right/Left: Left Hemiplegia - dominant/non-dominant: Non-Dominant Hemiplegia - caused by: Cerebral infarction   Activity Tolerance Patient tolerated treatment well   Patient Left in bed;with call bell/phone within reach;with bed alarm set   Nurse Communication Mobility status        Time: 3151-7616 OT Time Calculation (min): 29 min  Charges: OT General Charges $OT Visit: 1 Visit OT Treatments $Therapeutic Activity: 23-37 mins  Harley Alto., COTA/L Acute Rehabilitation Services [phone]    Precious Haws 09/26/2020, 11:24 AM

## 2020-09-26 NOTE — Progress Notes (Signed)
Palliative Care Progress Note  Call from RN related to patient's ongoing yelling out and inability to rest or be comfortable. Disordered sleep and agitation persist. Poor PO intake and overall failure to thrive persists. Following up after initial palliative consultation yesterday.  Mrs. Glendening clearly has severe acute delirium -she is yelling for help and can be heard with the door closed on the unit- this is constant per nursing staff. Based on her head CT with moderate atrophy and extensive small vessel disease, she has likely had baseline dementia for several years and it is age-advanced.   She may also have covid related neurocognitive decline that can significantly impact her prognosis for recovery. The longer she is in this delirium state the more likely it is that this will persist an drastically impact her baseline QOL.  Severe Acute Delirium vs. COVID related neurocognitive decline Given her level of behavioral disturbance is likely not ready for discharge-will add on medications for her symptom management and see if these improve her condition.  Scheduled Tylenol TID  Added Pepcid-she has complained of her stomach hurting and is not eating.  Will add Haldol for both nausea and agitation PRN  Will call patient's daughter to discuss her mom's condition and tio further discuss goals of care.  Lane Hacker, DO Palliative Medicine  Time: 35 min Greater than 50%  of this time was spent counseling and coordinating care related to the above assessment and plan.

## 2020-09-26 NOTE — TOC Initial Note (Addendum)
Transition of Care [facility]) - Initial/Assessment Note    Patient Details  Name: Jacqueline Goodman MRN: [id number] Date of Birth: 24-Apr-1951  [facility] CM/SW Contact:    Pollie Friar, RN Phone Number: 09/26/2020, 12:05 PM  Clinical Narrative:                 Pt only oriented times 1. CM reached out to her daughter over the phone. She is interested in rehab after the hospital stay. CM went over with her that there are only 2 local facilities accepting covid + patients. She is going to discuss this with her family and decide whether they prefer SNF rehab vs taking the patient home. CM did go over the DME recommendations with the daughter is they do decide to take her home. Daughter in agreement to having pt faxed out for SNF rehab. She will update CM once she has talked with her family.  TOC following.  1610: CM called and updated the patients daughter or the bed offer. She is going to talk to the family and make a decision. CM to f/u with her in am.  Expected Discharge Plan: [place] Barriers to Discharge: Continued Medical Work up   Patient Goals and CMS Choice   CMS Medicare.gov Compare Post Acute Care list provided to:: Patient Represenative (must comment) Choice offered to / list presented to : Adult Children  Expected Discharge Plan and Services Expected Discharge Plan: Skilled Nursing Facility In-house Referral: Clinical Social Work Discharge Planning Services: CM Consult Post Acute Care Choice: [place] Living arrangements for the past 2 months: [place]                                      Prior Living Arrangements/Services Living arrangements for the past 2 months: Single Family Home Lives with:: Spouse,Adult Children Patient language and need for interpreter reviewed:: Yes Do you feel safe going back to the place where you live?: Yes      Need for Family Participation in Patient Care: Yes (Comment) Care  giver support system in place?: Yes (comment)   Criminal Activity/Legal Involvement Pertinent to Current Situation/Hospitalization: No - Comment as needed  Activities of Daily Living      Permission Sought/Granted                  Emotional Assessment       Orientation: : Oriented to Self   Psych Involvement: No (comment)  Admission diagnosis:  Metabolic encephalopathy [K16.01] FTT (failure to thrive) in adult [R62.7] AKI (acute kidney injury) ([place]) [N17.9] COVID-19 [U07.1] Patient Active Problem List   Diagnosis Date Noted  . AKI (acute kidney injury) ([place])   . FTT (failure to thrive) in adult   . Metabolic encephalopathy   . COVID-19 09/23/2020  . Elevated liver enzymes 08/17/2020  . Weight loss 03/15/2020  . Vertigo 05/12/2019  . Obesity 12/25/2017  . Pain in joint, ankle and foot 11/13/2017  . 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 Situational anxiety 03/13/2017  . Hyperlipidemia 07/05/2016  . Essential hypertension 06/28/2016  . Diabetes mellitus type 2 in obese ([place]) 06/07/2016   PCP:  Lind Covert, MD Pharmacy:   [id number] [place], [place] [address] Phone: [phone] Fax: [phone]     Social Determinants of Health (SDOH) Interventions    Readmission Risk  Interventions No flowsheet data found.

## 2020-09-26 NOTE — NC FL2 (Signed)
[place] MEDICAID FL2 LEVEL OF CARE SCREENING TOOL     IDENTIFICATION  Patient Name: Jacqueline Goodman Birthdate: 08-21-1951 Sex: female Admission Date (Current Location): 09/23/2020  [facility] [place] Number:  Herbalist and Address:  [facility] [place], [place] [address] [place], [place] [address]      Provider Number: [id number]  Attending Physician Name and Address:  Zenia Resides, MD  Relative Name and Phone Number:       Current Level of Care: Hospital Recommended Level of Care: [place] Prior Approval Number:    Date Approved/Denied:   PASRR Number: [id number]  Discharge Plan: SNF    Current Diagnoses: Patient Active Problem List   Diagnosis Date Noted  . AKI (acute kidney injury) ([place])   . FTT (failure to thrive) in adult   . Metabolic encephalopathy   . COVID-19 09/23/2020  . Elevated liver enzymes 08/17/2020  . Weight loss 03/15/2020  . Vertigo 05/12/2019  . Obesity 12/25/2017  . Pain in joint, ankle and foot 11/13/2017  . 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 Situational anxiety 03/13/2017  . Hyperlipidemia 07/05/2016  . Essential hypertension 06/28/2016  . Diabetes mellitus type 2 in obese ([place]) 06/07/2016    Orientation RESPIRATION BLADDER Height & Weight     Self  Normal Incontinent Weight: 249 lb 1.9 oz (113 kg) Height:     BEHAVIORAL SYMPTOMS/MOOD NEUROLOGICAL BOWEL NUTRITION STATUS      Incontinent Diet (carb modified)  AMBULATORY STATUS COMMUNICATION OF NEEDS Skin   Extensive Assist Verbally Other (Comment) (moisture associated skin damage, left breast, moisture barrier)                       Personal Care Assistance Level of Assistance  Bathing,Feeding,Dressing Bathing Assistance: Maximum assistance Feeding assistance: Limited assistance Dressing Assistance: Maximum assistance     Functional Limitations Info             SPECIAL CARE FACTORS FREQUENCY  PT (By licensed PT),OT  (By licensed OT)     PT Frequency: 5x/wk OT Frequency: 5x/wk            Contractures Contractures Info: Not present    Additional Factors Info  Code Status,Allergies,Insulin Sliding Scale,Psychotropic,Isolation Precautions Code Status Info: Full Allergies Info: Morphine And Related, Tape Psychotropic Info: Celexa 10mg  daily Insulin Sliding Scale Info: 0-9 units every 4 hours; Lantus 20 units daily Isolation Precautions Info: AIrborne/Contact: COVID-19     Current Medications (09/26/2020):  This is the current hospital active medication list Current Facility-Administered Medications  Medication Dose Route Frequency Provider Last Rate Last Admin  . acetaminophen (TYLENOL) tablet 650 mg  650 mg Oral Q6H PRN Lurline Del, DO   650 mg at 09/26/20 5284  . atorvastatin (LIPITOR) tablet 40 mg  40 mg Oral Daily Lurline Del, DO   40 mg at 09/26/20 1324  . citalopram (CELEXA) tablet 10 mg  10 mg Oral Daily Rosezella Rumpf, NP   10 mg at 09/26/20 4010  . enoxaparin (LOVENOX) injection 55 mg  55 mg Subcutaneous Q24H Zenia Resides, MD   55 mg at 09/25/20 1731  . feeding supplement (ENSURE ENLIVE / ENSURE PLUS) liquid 237 mL  237 mL Oral TID BM Zenia Resides, MD   237 mL at 09/26/20 0921  . insulin aspart (novoLOG) injection 0-9 Units  0-9 Units Subcutaneous Q4H Welborn, Ryan, DO   2 Units at 09/26/20 0500  . insulin glargine (LANTUS)  injection 20 Units  20 Units Subcutaneous Daily Lurline Del, DO   20 Units at 09/26/20 4401  . insulin glargine (LANTUS) injection 8 Units  8 Units Subcutaneous Once Lurline Del, DO      . melatonin tablet 3 mg  3 mg Oral QHS PRN Alcus Dad, MD   3 mg at 09/26/20 0103  . multivitamin with minerals tablet 1 tablet  1 tablet Oral Daily Zenia Resides, MD   1 tablet at 09/26/20 [id number]  . polyethylene glycol (MIRALAX / GLYCOLAX) packet 17 g  17 g Oral Daily PRN Lurline Del, DO      . remdesivir 100 mg in sodium chloride 0.9 % 100 mL IVPB  100  mg Intravenous Daily Duanne Limerick, RPH 200 mL/hr at 09/26/20 0921 100 mg at 09/26/20 5366     Discharge Medications: Please see discharge summary for a list of discharge medications.  Relevant Imaging Results:  Relevant Lab Results:   Additional Information SS#: [id number]; Patient is not vaccinated  Geralynn Ochs, LCSW

## 2020-09-26 NOTE — Progress Notes (Addendum)
Palliative Medicine RN Note: Rec'd a call from pt's RN Kathlee Nations. She reports that pt is calling out all day and night for days, and she asked if there is anything PMT can order for what sounds like hyperactive delirium. I explained that daughter was not ready to shift to comfort per her call with our NP yesterday, but I will page and see if anyone from our team can follow up today. Our team is stretched very thin and had follow up planned for tomorrow, so it will be hours before our team can go.   I recommended that Kathlee Nations call the attending team in the interim to see if they will intervene.  Marjie Skiff Cherylann Ratel, RN, BSN, [facility] Palliative Medicine Team 09/26/2020 11:45 AM Office [phone]   ADDENDUM: Dr Hilma Favors will see Mrs Boddy this evening.  Marjie Skiff Rolly Magri, RN, BSN, [facility] Palliative Medicine Team 09/26/2020 12:16 PM Office [phone]

## 2020-09-26 NOTE — Telephone Encounter (Signed)
Discussed with Dr Manus Rudd He will contact daughter

## 2020-09-27 ENCOUNTER — Ambulatory Visit: Payer: Medicare Other

## 2020-09-27 LAB — GLUCOSE, CAPILLARY
Glucose-Capillary: 158 mg/dL — ABNORMAL HIGH (ref 70–99)
Glucose-Capillary: 129 mg/dL — ABNORMAL HIGH (ref 70–99)
Glucose-Capillary: 215 mg/dL — ABNORMAL HIGH (ref 70–99)
Glucose-Capillary: 215 mg/dL — ABNORMAL HIGH (ref 70–99)
Glucose-Capillary: 222 mg/dL — ABNORMAL HIGH (ref 70–99)
Glucose-Capillary: 184 mg/dL — ABNORMAL HIGH (ref 70–99)

## 2020-09-27 LAB — CBC WITH DIFFERENTIAL/PLATELET
Abs Immature Granulocytes: 0.05 10*3/uL (ref 0.00–0.07)
Basophils Absolute: 0 10*3/uL (ref 0.0–0.1)
Basophils Relative: 0 %
Eosinophils Absolute: 0.1 10*3/uL (ref 0.0–0.5)
Eosinophils Relative: 1 %
HCT: 35.1 % — ABNORMAL LOW (ref 36.0–46.0)
Hemoglobin: 12.5 g/dL (ref 12.0–15.0)
Immature Granulocytes: 1 %
Lymphocytes Relative: 15 %
Lymphs Abs: 1.3 10*3/uL (ref 0.7–4.0)
MCH: 29.3 pg (ref 26.0–34.0)
MCHC: 35.6 g/dL (ref 30.0–36.0)
MCV: 82.4 fL (ref 80.0–100.0)
Monocytes Absolute: 0.5 10*3/uL (ref 0.1–1.0)
Monocytes Relative: 6 %
Neutro Abs: 6.5 10*3/uL (ref 1.7–7.7)
Neutrophils Relative %: 77 %
Platelets: 235 10*3/uL (ref 150–400)
RBC: 4.26 MIL/uL (ref 3.87–5.11)
RDW: 12.1 % (ref 11.5–15.5)
WBC: 8.4 10*3/uL (ref 4.0–10.5)
nRBC: 0 % (ref 0.0–0.2)

## 2020-09-27 LAB — COMPREHENSIVE METABOLIC PANEL
ALT: 40 U/L (ref 0–44)
AST: 50 U/L — ABNORMAL HIGH (ref 15–41)
Albumin: 2.2 g/dL — ABNORMAL LOW (ref 3.5–5.0)
Alkaline Phosphatase: 58 U/L (ref 38–126)
Anion gap: 11 (ref 5–15)
BUN: 46 mg/dL — ABNORMAL HIGH (ref 8–23)
CO2: 23 mmol/L (ref 22–32)
Calcium: 8.5 mg/dL — ABNORMAL LOW (ref 8.9–10.3)
Chloride: 103 mmol/L (ref 98–111)
Creatinine, Ser: 0.95 mg/dL (ref 0.44–1.00)
GFR, Estimated: >60 mL/min (ref >60)
Glucose, Bld: 196 mg/dL — ABNORMAL HIGH (ref 70–99)
Potassium: 3.8 mmol/L (ref 3.5–5.1)
Sodium: 137 mmol/L (ref 135–145)
Total Bilirubin: 0.8 mg/dL (ref 0.3–1.2)
Total Protein: 5.2 g/dL — ABNORMAL LOW (ref 6.5–8.1)

## 2020-09-27 LAB — C-REACTIVE PROTEIN: CRP: 1.5 mg/dL — ABNORMAL HIGH (ref <1.0)

## 2020-09-27 LAB — D-DIMER, QUANTITATIVE: D-Dimer, Quant: 0.61 ug/mL-FEU — ABNORMAL HIGH (ref 0.00–0.50)

## 2020-09-27 LAB — MAGNESIUM: Magnesium: 1.9 mg/dL (ref 1.7–2.4)

## 2020-09-27 NOTE — Progress Notes (Signed)
PCP Visit  Family Medicine Teaching Service Attending Note  I interviewed and examined patient Jacqueline Goodman and reviewed their tests and x-rays.  I discussed with Dr. Jeani Hawking and reviewed their note for today.  I agree with their assessment and plan.     Additionally  Spoke with daughter and patient.  All agree to NHP. Want to further consider before going DNR.  Daughter is encouraged patient seems more interactive. I agree.  She was cooperative and calm, knew her home phone number and was able to talk on the phone  Appreciate Dr Hilma Favors and PCs help

## 2020-09-27 NOTE — Progress Notes (Signed)
This chaplain responded to PMT consult for spiritual care.  The chaplain understands from talking to the Pt. RN-Liz the Pt. was sleeping earlier today.  Upon arrival the chaplain hears the Pt. yelling for [name].   The chaplain enters, introduces herself, and acknowledges the Pt. calls. The Pt. shares she wants [name] to fix her covers.  The chaplain pulls up the sheet and blanket and learns the Pt. has a son and daughter. The Pt. remains calm and conversational with intermittent yawns for the remainder of the visit.  This chaplain is available for F/U spiritual care as needed.

## 2020-09-27 NOTE — Progress Notes (Addendum)
Family Medicine Teaching Service Daily Progress Note Intern Pager: [phone]  Patient name: Jacqueline Goodman Medical record number: [id number] Date of birth: March 11, 1951 Age: 70 y.o. Gender: female  Primary Care Provider: Lind Covert, MD Consultants: Nutrition Code Status: Full  Pt Overview and Major Events to Date:  Admitted 1/29 Palliative consulted 1/30  Assessment and Plan: Jacqueline Goodman is a 70 year old female presenting with failure to thrive, found to be Covid positive.  PMH includes HLD, HTN, T2DM.  Failure to thrive, weakness Patient continues to feel fatigued and weak; however, has no specific complaints.  Denies any pain. -Palliative following, appreciate recommendations -TOC working on placement in SNF  Mild COVID-19 Vaccinated.  Currently stable on room air with SPO2 >94%. -Final day remdesivir -Continue isolation precautions  AKI: Improving Improving creatinine, 0.95 today (1.17 > 1.00 > 0.95). -Avoid nephrotoxic agents  Protein calorie malnutrition Low albumin, 2.2 today. -Nutrition consulted, appreciate recommendations -Ensure Enlive 3 times daily -MVI daily -Carb modified diet  Type 2 diabetes Glucose this morning 196, 158.  Last 24 hours 118-186, having received 20 units Lantus and 9 units sliding scale.  Last several meals documented only 10% consumption.  At home, on Ozempic and glimepiride.  Currently holding home glimepiride. -Continue home Ozempic -Sensitive sliding scale -Continue Lantus 20, low due to poor p.o. intake  Hypertension BPs last 24 hours 145-161/71-113, pulses 72-79.  Last 156/82.  On losartan at home, however has been held this admission due to AKI. -Given resolution of AKI, consider restarting losartan tomorrow morning  Hyperlipidemia -Continue home Lipitor  History of elevated LFTs AST ALT improving today.  AST 50 this morning (64 > 59 > 50), ALT 40 (49 > 46 > 40). -Check with morning labs   FEN/GI: Carb  modified PPx: Lovenox   Status is: Inpatient  Remains inpatient appropriate because:Altered mental status, Unsafe d/c plan and Inpatient level of care appropriate due to severity of illness   Dispo: The patient is from: Home              Anticipated d/c is to: SNF              Anticipated d/c date is: 2 days              Patient currently is medically stable to d/c.   Difficult to place patient No   Subjective:  Patient laying in bed awake.  Patient reports she slept "okay "overnight and is eating and drinking normally.  Reports normal bathroom habits.  Denies any pain.  Still feels fatigued and weak, however no further specific complaints.  Objective: Temp:  [97.5 F (36.4 C)-98.6 F (37 C)] 98 F (36.7 C) (02/01 1218) Pulse Rate:  [72-75] 75 (02/01 1218) Resp:  [16-19] 18 (02/01 1218) BP: (145-161)/(73-103) 150/99 (02/01 1218) SpO2:  [94 %-99 %] 99 % (02/01 1218) Physical Exam: General: Awake, alert, quiet and agreeable, no acute distress Respiratory: Unlabored respirations, normal work of breathing, no respiratory distress Extremities: No BLE edema, moving all extremities spontaneously Neuro: Cranial nerves II through X grossly intact  Laboratory: Recent Labs  Lab 09/25/20 0025 09/26/20 0716 09/27/20 0341  WBC 5.1 7.5 8.4  HGB 12.8 13.0 12.5  HCT 39.0 36.2 35.1*  PLT 205 228 235   Recent Labs  Lab 09/25/20 0025 09/26/20 0716 09/27/20 0341  NA 136 138 137  K 3.7 3.7 3.8  CL 102 105 103  CO2 22 21* 23  BUN 49* 46* 46*  CREATININE 1.17*  1.00 0.95  CALCIUM 8.2* 8.5* 8.5*  PROT 5.6* 5.7* 5.2*  BILITOT 0.3 0.9 0.8  ALKPHOS 65 67 58  ALT 49* 46* 40  AST 64* 59* 50*  GLUCOSE 312* 118* 196*    Imaging/Diagnostic Tests: None last 24 hours  Ezequiel Essex, MD 09/27/2020, 4:51 PM PGY-1, [place] Intern pager: [phone], text pages welcome

## 2020-09-27 NOTE — Progress Notes (Signed)
PT Cancellation Note  Patient Details Name: Jacqueline Goodman MRN: [id number] DOB: 20-May-1951   Cancelled Treatment:    Reason Eval/Treat Not Completed: Other (comment) (pt has been agitated all morning, RN gave meds and pt is finally asleep. Rn deferred PT treatment)  Lyanne Co, DPT Acute Rehabilitation Services [phone]  [name] 09/27/2020, 12:35 PM

## 2020-09-27 NOTE — TOC Progression Note (Cosign Needed)
Transition of Care [facility]) - Progression Note    Patient Details  Name: JONNETTE NUON MRN: [id number] Date of Birth: 07-12-1951  Transition of [facility]) CM/SW Contact  Marney Setting, [place] Work Phone Number: 09/27/2020, 4:20 PM  Clinical Narrative:   MSW Student talked to patients daughter to provide choices of [place] or [name] for patient. Daughter says she would like to do [name] because it is close to her house and she has worked there before. Student informed daughter that she will be informed when patient is ready for discharge.     Expected Discharge Plan: [place] Barriers to Discharge: Continued Medical Work up  Expected Discharge Plan and Services Expected Discharge Plan: [place] In-house Referral: Clinical Social Work Discharge Planning Services: CM Consult Post Acute Care Choice: [place] arrangements for the past 2 months: Single Family Home                                       Social Determinants of Health (SDOH) Interventions    Readmission Risk Interventions No flowsheet data found.

## 2020-09-28 DIAGNOSIS — F413 Other mixed anxiety disorders: Secondary | ICD-10-CM | POA: Diagnosis not present

## 2020-09-28 DIAGNOSIS — G4700 Insomnia, unspecified: Secondary | ICD-10-CM | POA: Diagnosis not present

## 2020-09-28 DIAGNOSIS — R627 Adult failure to thrive: Secondary | ICD-10-CM | POA: Diagnosis not present

## 2020-09-28 DIAGNOSIS — R410 Disorientation, unspecified: Secondary | ICD-10-CM | POA: Diagnosis not present

## 2020-09-28 DIAGNOSIS — M2550 Pain in unspecified joint: Secondary | ICD-10-CM | POA: Diagnosis not present

## 2020-09-28 DIAGNOSIS — Z0000 Encounter for general adult medical examination without abnormal findings: Secondary | ICD-10-CM | POA: Diagnosis not present

## 2020-09-28 DIAGNOSIS — I6529 Occlusion and stenosis of unspecified carotid artery: Secondary | ICD-10-CM | POA: Diagnosis not present

## 2020-09-28 DIAGNOSIS — R7989 Other specified abnormal findings of blood chemistry: Secondary | ICD-10-CM | POA: Diagnosis not present

## 2020-09-28 DIAGNOSIS — F0390 Unspecified dementia without behavioral disturbance: Secondary | ICD-10-CM | POA: Diagnosis not present

## 2020-09-28 DIAGNOSIS — J320 Chronic maxillary sinusitis: Secondary | ICD-10-CM | POA: Diagnosis not present

## 2020-09-28 DIAGNOSIS — R2681 Unsteadiness on feet: Secondary | ICD-10-CM | POA: Diagnosis not present

## 2020-09-28 DIAGNOSIS — I739 Peripheral vascular disease, unspecified: Secondary | ICD-10-CM | POA: Diagnosis not present

## 2020-09-28 DIAGNOSIS — Z9181 History of falling: Secondary | ICD-10-CM | POA: Diagnosis present

## 2020-09-28 DIAGNOSIS — E041 Nontoxic single thyroid nodule: Secondary | ICD-10-CM | POA: Diagnosis not present

## 2020-09-28 DIAGNOSIS — R4189 Other symptoms and signs involving cognitive functions and awareness: Secondary | ICD-10-CM | POA: Diagnosis not present

## 2020-09-28 DIAGNOSIS — Z8616 Personal history of COVID-19: Secondary | ICD-10-CM | POA: Diagnosis not present

## 2020-09-28 DIAGNOSIS — F39 Unspecified mood [affective] disorder: Secondary | ICD-10-CM | POA: Diagnosis not present

## 2020-09-28 DIAGNOSIS — R279 Unspecified lack of coordination: Secondary | ICD-10-CM | POA: Diagnosis not present

## 2020-09-28 DIAGNOSIS — G319 Degenerative disease of nervous system, unspecified: Secondary | ICD-10-CM | POA: Diagnosis not present

## 2020-09-28 DIAGNOSIS — E43 Unspecified severe protein-calorie malnutrition: Secondary | ICD-10-CM | POA: Diagnosis not present

## 2020-09-28 DIAGNOSIS — F32A Depression, unspecified: Secondary | ICD-10-CM | POA: Diagnosis not present

## 2020-09-28 DIAGNOSIS — Z79899 Other long term (current) drug therapy: Secondary | ICD-10-CM | POA: Diagnosis not present

## 2020-09-28 DIAGNOSIS — Z794 Long term (current) use of insulin: Secondary | ICD-10-CM | POA: Diagnosis not present

## 2020-09-28 DIAGNOSIS — E119 Type 2 diabetes mellitus without complications: Secondary | ICD-10-CM | POA: Diagnosis not present

## 2020-09-28 DIAGNOSIS — U071 COVID-19: Secondary | ICD-10-CM | POA: Diagnosis not present

## 2020-09-28 DIAGNOSIS — N390 Urinary tract infection, site not specified: Secondary | ICD-10-CM | POA: Diagnosis not present

## 2020-09-28 DIAGNOSIS — I2510 Atherosclerotic heart disease of native coronary artery without angina pectoris: Secondary | ICD-10-CM | POA: Diagnosis not present

## 2020-09-28 DIAGNOSIS — M6281 Muscle weakness (generalized): Secondary | ICD-10-CM | POA: Diagnosis not present

## 2020-09-28 DIAGNOSIS — I700 Atherosclerosis of aorta: Secondary | ICD-10-CM | POA: Diagnosis not present

## 2020-09-28 DIAGNOSIS — E669 Obesity, unspecified: Secondary | ICD-10-CM | POA: Diagnosis not present

## 2020-09-28 DIAGNOSIS — R41841 Cognitive communication deficit: Secondary | ICD-10-CM | POA: Diagnosis not present

## 2020-09-28 DIAGNOSIS — E440 Moderate protein-calorie malnutrition: Secondary | ICD-10-CM | POA: Diagnosis not present

## 2020-09-28 DIAGNOSIS — F419 Anxiety disorder, unspecified: Secondary | ICD-10-CM | POA: Diagnosis not present

## 2020-09-28 DIAGNOSIS — M6259 Muscle wasting and atrophy, not elsewhere classified, multiple sites: Secondary | ICD-10-CM | POA: Diagnosis not present

## 2020-09-28 DIAGNOSIS — Z043 Encounter for examination and observation following other accident: Secondary | ICD-10-CM | POA: Diagnosis not present

## 2020-09-28 DIAGNOSIS — M47812 Spondylosis without myelopathy or radiculopathy, cervical region: Secondary | ICD-10-CM | POA: Diagnosis not present

## 2020-09-28 DIAGNOSIS — N179 Acute kidney failure, unspecified: Secondary | ICD-10-CM | POA: Diagnosis not present

## 2020-09-28 DIAGNOSIS — E1169 Type 2 diabetes mellitus with other specified complication: Secondary | ICD-10-CM

## 2020-09-28 DIAGNOSIS — L304 Erythema intertrigo: Secondary | ICD-10-CM | POA: Diagnosis not present

## 2020-09-28 DIAGNOSIS — Z7401 Bed confinement status: Secondary | ICD-10-CM | POA: Diagnosis not present

## 2020-09-28 DIAGNOSIS — I10 Essential (primary) hypertension: Secondary | ICD-10-CM | POA: Diagnosis not present

## 2020-09-28 DIAGNOSIS — I6782 Cerebral ischemia: Secondary | ICD-10-CM | POA: Diagnosis not present

## 2020-09-28 DIAGNOSIS — F0391 Unspecified dementia with behavioral disturbance: Secondary | ICD-10-CM | POA: Diagnosis not present

## 2020-09-28 DIAGNOSIS — J189 Pneumonia, unspecified organism: Secondary | ICD-10-CM | POA: Diagnosis not present

## 2020-09-28 DIAGNOSIS — R630 Anorexia: Secondary | ICD-10-CM | POA: Diagnosis not present

## 2020-09-28 DIAGNOSIS — R531 Weakness: Secondary | ICD-10-CM | POA: Diagnosis not present

## 2020-09-28 DIAGNOSIS — W19XXXA Unspecified fall, initial encounter: Secondary | ICD-10-CM | POA: Diagnosis not present

## 2020-09-28 DIAGNOSIS — W06XXXA Fall from bed, initial encounter: Secondary | ICD-10-CM | POA: Diagnosis not present

## 2020-09-28 DIAGNOSIS — B373 Candidiasis of vulva and vagina: Secondary | ICD-10-CM | POA: Diagnosis not present

## 2020-09-28 LAB — CULTURE, BLOOD (ROUTINE X 2)
Culture: NO GROWTH
Special Requests: ADEQUATE

## 2020-09-28 LAB — CBC WITH DIFFERENTIAL/PLATELET
Abs Immature Granulocytes: 0.05 10*3/uL (ref 0.00–0.07)
Basophils Absolute: 0 10*3/uL (ref 0.0–0.1)
Basophils Relative: 0 %
Eosinophils Absolute: 0.1 10*3/uL (ref 0.0–0.5)
Eosinophils Relative: 1 %
HCT: 35.6 % — ABNORMAL LOW (ref 36.0–46.0)
Hemoglobin: 12.1 g/dL (ref 12.0–15.0)
Immature Granulocytes: 1 %
Lymphocytes Relative: 14 %
Lymphs Abs: 0.9 10*3/uL (ref 0.7–4.0)
MCH: 28.7 pg (ref 26.0–34.0)
MCHC: 34 g/dL (ref 30.0–36.0)
MCV: 84.6 fL (ref 80.0–100.0)
Monocytes Absolute: 0.4 10*3/uL (ref 0.1–1.0)
Monocytes Relative: 7 %
Neutro Abs: 5.1 10*3/uL (ref 1.7–7.7)
Neutrophils Relative %: 77 %
Platelets: 223 10*3/uL (ref 150–400)
RBC: 4.21 MIL/uL (ref 3.87–5.11)
RDW: 12.2 % (ref 11.5–15.5)
WBC: 6.5 10*3/uL (ref 4.0–10.5)
nRBC: 0 % (ref 0.0–0.2)

## 2020-09-28 LAB — COMPREHENSIVE METABOLIC PANEL
ALT: 35 U/L (ref 0–44)
AST: 41 U/L (ref 15–41)
Albumin: 2.1 g/dL — ABNORMAL LOW (ref 3.5–5.0)
Alkaline Phosphatase: 69 U/L (ref 38–126)
Anion gap: 9 (ref 5–15)
BUN: 50 mg/dL — ABNORMAL HIGH (ref 8–23)
CO2: 22 mmol/L (ref 22–32)
Calcium: 8.2 mg/dL — ABNORMAL LOW (ref 8.9–10.3)
Chloride: 103 mmol/L (ref 98–111)
Creatinine, Ser: 1.13 mg/dL — ABNORMAL HIGH (ref 0.44–1.00)
GFR, Estimated: 53 mL/min — ABNORMAL LOW (ref >60)
Glucose, Bld: 197 mg/dL — ABNORMAL HIGH (ref 70–99)
Potassium: 3.9 mmol/L (ref 3.5–5.1)
Sodium: 134 mmol/L — ABNORMAL LOW (ref 135–145)
Total Bilirubin: 0.5 mg/dL (ref 0.3–1.2)
Total Protein: 5.2 g/dL — ABNORMAL LOW (ref 6.5–8.1)

## 2020-09-28 LAB — GLUCOSE, CAPILLARY
Glucose-Capillary: 227 mg/dL — ABNORMAL HIGH (ref 70–99)
Glucose-Capillary: 206 mg/dL — ABNORMAL HIGH (ref 70–99)
Glucose-Capillary: 142 mg/dL — ABNORMAL HIGH (ref 70–99)
Glucose-Capillary: 264 mg/dL — ABNORMAL HIGH (ref 70–99)
Glucose-Capillary: 296 mg/dL — ABNORMAL HIGH (ref 70–99)
Glucose-Capillary: 325 mg/dL — ABNORMAL HIGH (ref 70–99)

## 2020-09-28 LAB — MAGNESIUM: Magnesium: 2 mg/dL (ref 1.7–2.4)

## 2020-09-28 LAB — D-DIMER, QUANTITATIVE: D-Dimer, Quant: 0.61 ug/mL-FEU — ABNORMAL HIGH (ref 0.00–0.50)

## 2020-09-28 LAB — C-REACTIVE PROTEIN: CRP: 2.3 mg/dL — ABNORMAL HIGH (ref <1.0)

## 2020-09-28 MED ORDER — INSULIN GLARGINE 100 UNIT/ML ~~LOC~~ SOLN: 20.0000 [IU] | Freq: Every day | SUBCUTANEOUS | 11 refills | Status: DC

## 2020-09-28 MED ORDER — POLYETHYLENE GLYCOL 3350 17 G PO PACK: 17.0000 g | PACK | Freq: Every day | ORAL | 0 refills | Status: DC | PRN

## 2020-09-28 MED ORDER — MELATONIN 3 MG PO TABS: 3.0000 mg | ORAL_TABLET | Freq: Every day | ORAL | 1 refills | Status: AC

## 2020-09-28 MED ORDER — FAMOTIDINE 20 MG PO TABS: 20.0000 mg | ORAL_TABLET | Freq: Every day | ORAL | 1 refills | Status: DC

## 2020-09-28 MED ORDER — CITALOPRAM HYDROBROMIDE 10 MG PO TABS: 10.0000 mg | ORAL_TABLET | Freq: Every day | ORAL | 1 refills | Status: DC

## 2020-09-28 MED ORDER — ADULT MULTIVITAMIN W/MINERALS CH: 1.0000 | ORAL_TABLET | Freq: Every day | ORAL | 3 refills | Status: DC

## 2020-09-28 MED ORDER — PROSOURCE PLUS PO LIQD: 30.0000 mL | Freq: Two times a day (BID) | ORAL | 3 refills | Status: DC

## 2020-09-28 MED ORDER — ACETAMINOPHEN 325 MG PO TABS: 650.0000 mg | ORAL_TABLET | Freq: Four times a day (QID) | ORAL | 0 refills | Status: AC | PRN

## 2020-09-28 MED ORDER — ENSURE ENLIVE PO LIQD: 237.0000 mL | Freq: Three times a day (TID) | ORAL | 12 refills | Status: DC

## 2020-09-28 NOTE — Telephone Encounter (Signed)
Patient LVM on nurse line regarding receiving "daily update from Dr. Erin Hearing".  Patient is still admitted in hospital, however, patient's daughter would like to speak with Dr. Erin Hearing regarding mother's status.   Talbot Grumbling, RN

## 2020-09-28 NOTE — Plan of Care (Signed)
  Problem: Education: Goal: Knowledge of risk factors and measures for prevention of condition will improve Outcome: Progressing   Problem: Coping: Goal: Psychosocial and spiritual needs will be supported Outcome: Progressing   Problem: Respiratory: Goal: Will maintain a patent airway Outcome: Progressing Goal: Complications related to the disease process, condition or treatment will be avoided or minimized Outcome: Progressing   

## 2020-09-28 NOTE — Progress Notes (Signed)
Family Medicine Teaching Service Daily Progress Note Intern Pager: [phone]  Patient name: Jacqueline Goodman Medical record number: [id number] Date of birth: 06-17-1951 Age: 70 y.o. Gender: female  Primary Care Provider: Lind Covert, MD Consultants: Nutrition, palliative Code Status: Full  Pt Overview and Major Events to Date:  Admitted 1/29 Palliative consulted 1/30  Assessment and Plan: Ms. Jacqueline Goodman is a 70 year old female presenting with failure to thrive, incidentally found to be Covid positive. PMH includes HLD, HTN, T2DM.  Failure to thrive  Weakness  Protein calorie malnutrition She stills feels quite weak and has not been getting up and around much because of this. She does report a subjective improvement in her fatigue and is hopeful.  Albumin low but stable at 2.1 today (2.2 yesterday).  -Ensure Enlive BID -MVI daily -Carb modified diet -TOC working on placement in SNF  AKI Increase slightly from yesterday.  Today 1.13 (1.17 > 1.00 > 0.95 > 1.13).   -Avoid nephrotoxic agents -Monitor creatinine tomorrow morning -Restarting losartan this morning  Mild COVID-19 Vaccinated.  Currently stable on room air with SPO2 last 24 hours greater than 94%.  S/p remdesivir 1/28-2/1.  Covid positive test on 1/28. -Continue isolation precautions.   Type 2 diabetes Glucose this morning 206, 196.   Last 24 hours, ranged 184-215.  No documented percent meal eaten last 24 hours.  Last 24 hours received 20 units Lantus, 14 units sliding scale.  Has been getting Ensure twice daily. At home, on Ozempic and glimepiride.  Currently holding home glimepiride. -Continue home Ozempic -Sensitive sliding scale -Continue Lantus 20, low due to poor p.o. intake  Hypertension BPs last 24 hours 124-150/60-103 with pulses 70-84.  Last 134/68 with pulse 74.   Home losartan was held on admission due to AKI.  -Restarting losartan this morning -Continue to monitor creatinine as  above  Hyperlipidemia -Continue home atorvastatin 40 mg daily  History of elevated LFTs: Improving AST and ALT normal today, 41 and 35 respectively.  AST 64 > 59 > 50 > 41. ALT 49 > 46 > 40 > 35.  Wonder if due to failure to thrive and protein calorie malnutrition. -Simply check BMP tomorrow, will not follow unless clinical course changes   FEN/GI: Carb modified PPx: Lovenox   Status is: Inpatient  Remains inpatient appropriate because:awaiting SNF placement   Dispo: The patient is from: Home              Anticipated d/c is to: SNF              Anticipated d/c date is: 1 day              Patient currently is medically stable to d/c.   Difficult to place patient No   Subjective:  Patient found sitting upright in bed. No complaints, says she slept "alright". She stills feels quite weak and has not been getting up and around much because of this. She does report a subjective improvement in her fatigue and is hopeful. Has no specific complaints. Denies pain.   Objective: Temp:  [97.6 F (36.4 C)-98.6 F (37 C)] 98.2 F (36.8 C) (02/02 0407) Pulse Rate:  [70-84] 74 (02/02 0407) Resp:  [16-22] 17 (02/02 0407) BP: (124-150)/(60-103) 134/68 (02/02 0407) SpO2:  [94 %-99 %] 95 % (02/02 0407)  Physical Exam: General: Awake, alert, oriented, no acute distress Respiratory: Unlabored respirations, normal work of breathing, no respiratory distress Extremities: No bilateral lower extremity edema, palpable pretibial pulses bilaterally, warm dry  skin on ankles Neuro: Cranial nerves II through X grossly intact, able to move all extremities spontaneously  Laboratory: Recent Labs  Lab 09/26/20 0716 09/27/20 0341 09/28/20 0423  WBC 7.5 8.4 6.5  HGB 13.0 12.5 12.1  HCT 36.2 35.1* 35.6*  PLT 228 235 223   Recent Labs  Lab 09/26/20 0716 09/27/20 0341 09/28/20 0423  NA 138 137 134*  K 3.7 3.8 3.9  CL 105 103 103  CO2 21* 23 22  BUN 46* 46* 50*  CREATININE 1.00 0.95 1.13*   CALCIUM 8.5* 8.5* 8.2*  PROT 5.7* 5.2* 5.2*  BILITOT 0.9 0.8 0.5  ALKPHOS 67 58 69  ALT 46* 40 35  AST 59* 50* 41  GLUCOSE 118* 196* 197*    Imaging/Diagnostic Tests: None last 24 hours  Ezequiel Essex, MD 09/28/2020, 7:28 AM PGY-1, [place] Intern pager: [phone], text pages welcome

## 2020-09-28 NOTE — Progress Notes (Addendum)
Nurse called report to [facility]. All questions answered. PT iv removed and ipad returned.  Gwendolyn Grant, RN

## 2020-09-28 NOTE — Discharge Summary (Addendum)
[facility] Discharge Summary  Patient name: Jacqueline Goodman Medical record number: [id number] Date of birth: 1951/04/16 Age: 70 y.o. Gender: female Date of Admission: 09/23/2020  Date of Discharge: 09/28/2020 Admitting Physician: Zenia Resides, MD  Primary Care Provider: Lind Covert, MD Consultants: None  Indication for Hospitalization: Failure to thrive, protein calorie malnutrition, COVID-19  Discharge Diagnoses/Problem List:  Failure to thrive Weakness Fatigue Protein calorie malnutrition COVID-19 Type 2 diabetes Hypertension Hyperlipidemia History of elevated LFTs: improved  Disposition: SNF  Discharge Condition: Stable  Discharge Exam:  General: Awake, alert, oriented, no acute distress Respiratory: Unlabored respirations, normal work of breathing, no respiratory distress Extremities: No bilateral lower extremity edema, palpable pretibial pulses bilaterally, warm dry skin on ankles Neuro: Cranial nerves II through X grossly intact, able to move all extremities spontaneously   Brief Hospital Course:  Jacqueline Goodman is a 70 y.o. female presenting with failure to thrive and incidentally found to be COVID-19 positive. PMH is significant for hyperlipidemia, hypertension, type 2 diabetes.   Failure to thrive  Protein calorie malnutrition Patient admitted on 1/28 with fatigue and weakness concerning for failure to thrive and protein calorie malnutrition. Admission albumin 2.7, ranged from 2.1 to 2.7 throughout hospital stay. She was treated with PROsource Plus liquid feeding supplementation BID, Ensure Enlive TID, and multivitamin with minerals. Suggest continuation of these after discharge.   COVID-19 Found to be Covid positive on admission 09/23/2020.   Daughter indicates symptoms started on 19th and have worsened; symptoms include fatigue, decreased appetitie and diarrhea. Patient afebrile and breathing well on room air throughout  admission. Received five day course of Remdesivir. Stable on day of discharge with some subjective improvement in fatigue. No need for supplemental oxygen or outpatient medications. End of 10-day quarantine would be Monday, Feb. 8th.   AKI Admission creatinine 1.35, baseline per chart review believed to be around 1.0. Likely due to decreased PO intake secondary to current COVID infection. Home losartan held upon admission to avoid further AKI worsening. Creatinine decreased to nadir of 0.95 prior to discharge. Day of discharge, creatinine 1.13. Encourage PO hydration and restarting losartan upon discharge with close PCP follow up.   Chronic conditions stable during admission: History of elevated LFTs Hyperlipidemia Type 2 diabetes Hypertension    Issues for Follow Up:  COVID-19: Follow up fatigue, respiratory status, progress with physical therapy.  Protein calorie malnutrition: Continue supplementation, consider escalation of supplementation as needed. Consider rechecking albumin at hospital follow up.  AKI: Restarted losartan at discharge for benefit to blood pressure and diabetes. Recheck creatinine at hospital follow up.  HTN: Restarted losartan at discharge. Monitor BP. Diabetes: Stopped glimepiride. Decreased daily basal insulin from 60 units to 20 units daily. Follow up A1c, ensure medication tolerance. May need escalation of basal insulin after recovered from [place].   Significant Procedures: None  Significant Labs and Imaging:  Recent Labs  Lab 09/26/20 0716 09/27/20 0341 09/28/20 0423  WBC 7.5 8.4 6.5  HGB 13.0 12.5 12.1  HCT 36.2 35.1* 35.6*  PLT 228 235 223   Recent Labs  Lab 09/24/20 0222 09/25/20 0025 09/26/20 0716 09/27/20 0341 09/28/20 0423  NA 135 136 138 137 134*  K 3.9 3.7 3.7 3.8 3.9  CL 103 102 105 103 103  CO2 23 22 21* 23 22  GLUCOSE 221* 312* 118* 196* 197*  BUN 46* 49* 46* 46* 50*  CREATININE 1.22* 1.17* 1.00 0.95 1.13*  CALCIUM 7.9* 8.2* 8.5*  8.5* 8.2*  MG 2.1 2.1 1.8 1.9 2.0  ALKPHOS 69 65 67 58 69  AST 66* 64* 59* 50* 41  ALT 53* 49* 46* 40 35  ALBUMIN 2.5* 2.4* 2.4* 2.2* 2.1*   PORTABLE CHEST 1 VIEW 09/28/2020 COMPARISON:  09/09/2017 FINDINGS: Mild elevation right hemidiaphragm unchanged. Heart size upper normal. Negative for heart failure. Lungs are clear without infiltrate or effusion. IMPRESSION: No active disease.  CT HEAD WITHOUT CONTRAST 09/28/2020 TECHNIQUE: Contiguous axial images were obtained from the base of the skull through the vertex without intravenous contrast. COMPARISON:  Head CT report dated 01/28/2002 FINDINGS: Brain: Moderately enlarged ventricles and subarachnoid spaces. Mild-to-moderate patchy white matter low density in both cerebral hemispheres. No intracranial hemorrhage, mass lesion or CT evidence of acute infarction. Vascular: No hyperdense vessel or unexpected calcification. Skull: Normal. Negative for fracture or focal lesion. Sinuses/Orbits: Status post bilateral cataract extraction. Marked left maxillary sinus mucosal thickening and completely opacified right maxillary sinus with soft tissue density extending through the medial wall of the sinus into the nasal cavity. Other: None. IMPRESSION: 1. No acute abnormality. 2. Moderate diffuse cerebral and cerebellar atrophy. 3. Mild to moderate chronic small vessel white matter ischemic changes in both cerebral hemispheres. 4. 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  Results/Tests Pending at Time of Discharge: None  Discharge Medications:  Allergies as of 09/28/2020       Reactions   Morphine And Related Other (See Comments)   "says doesn't work"   Tape Rash   Breaks the skin down        Medication List     STOP taking these medications    glimepiride 2 MG tablet Commonly known as: AMARYL   Tresiba FlexTouch 200 UNIT/ML FlexTouch Pen Generic drug: insulin degludec        TAKE these medications    (feeding supplement) PROSource Plus liquid Take 30 mLs by mouth 2 (two) times daily between meals.   feeding supplement Liqd Take 237 mLs by mouth 3 (three) times daily between meals.   acetaminophen 325 MG tablet Commonly known as: TYLENOL Take 2 tablets (650 mg total) by mouth every 6 (six) hours as needed for mild pain or headache (fever >/= 101).   atorvastatin 40 MG tablet Commonly known as: LIPITOR TAKE 1 TABLET BY MOUTH EVERY DAY   BD Pen Needle Nano 2nd Gen 32G X 4 MM Misc Generic drug: Insulin Pen Needle Use as directed once daily.  May substitute for needle that fits Basaglar Kwikpen   Besivance 0.6 % Susp Generic drug: Besifloxacin HCl Apply 1 drop to eye 3 (three) times daily.   citalopram 10 MG tablet Commonly known as: CELEXA Take 1 tablet (10 mg total) by mouth daily. Start taking on: September 29, 2020   famotidine 20 MG tablet Commonly known as: PEPCID Take 1 tablet (20 mg total) by mouth at bedtime.   insulin glargine 100 UNIT/ML injection Commonly known as: LANTUS Inject 0.2 mLs (20 Units total) into the skin daily.   Insulin Syringe-Needle U-100 31G X 5/16" 0.3 ML Misc Commonly known as: B-D INS SYR ULTRAFINE .3CC/31G Use as directed   losartan 25 MG tablet Commonly known as: COZAAR TAKE 1 TABLET BY MOUTH EVERY DAY   melatonin 3 MG Tabs tablet Take 1 tablet (3 mg total) by mouth at bedtime.   multivitamin with minerals Tabs tablet Take 1 tablet by mouth daily.   OneTouch Ultra test strip Generic drug: glucose blood USE TO TEST BLOOD  SUGAR ONCE EVERY MORNING BEFORE EATING. ICD-10 CODE: E11.9   Ozempic (0.25 or 0.5 MG/DOSE) 2 MG/1.5ML Sopn Generic drug: Semaglutide(0.25 or 0.5MG /DOS) Inject 0.5 mg into the skin once a week. What changed:  how much to take additional instructions   polyethylene glycol 17 g packet Commonly known as: MIRALAX / GLYCOLAX Take 17 g by mouth daily as needed for mild  constipation.        Discharge Instructions: Please refer to Patient Instructions section of EMR for full details.  Patient was counseled important signs and symptoms that should prompt return to medical care, changes in medications, dietary instructions, activity restrictions, and follow up appointments.   Follow-Up Appointments:  Contact information for follow-up providers     Lind Covert, MD. Schedule an appointment as soon as possible for a visit in 1 week(s).   Specialty: Family Medicine Why: Make a hospital follow up appointment to see your primary care office within a week of discharge.  Contact information: [place] [address] [phone]              Contact information for after-discharge care     Destination     [facility] Preferred SNF .   Service: Skilled Nursing Contact information: [address] [place]                     Ezequiel Essex, MD 09/28/2020, 12:21 PM PGY-1, [place] Upper-Level Resident Addendum   I have independently interviewed and examined the patient. I have discussed the above with the original author and agree with their documentation. My edits for correction/addition/clarification are in purple. Please see also any attending notes.    Carollee Leitz MD PGY-2 [facility] Family Medicine 09/28/2020 1:24 PM  [place] Service pager: [phone] (text pages welcome through [facility])

## 2020-09-28 NOTE — TOC Transition Note (Signed)
Transition of Care [facility]) - CM/SW Discharge Note   Patient Details  Name: Jacqueline Goodman MRN: [id number] Date of Birth: 01-22-1951  [facility] CM/SW Contact:  Pollie Friar, RN Phone Number: 09/28/2020, 1:14 PM   Clinical Narrative:    Patient is discharging to [facility] today. Daughter, [name] aware and in agreement. Pt transporting via ambulance to the rehab.  D/c packet at the desk and bedside RN updated.  Room; 1002A Number for report: [id number]   Final next level of care: Skilled Nursing Facility Barriers to Discharge: No Barriers Identified   Patient Goals and CMS Choice   CMS Medicare.gov Compare Post Acute Care list provided to:: Patient Represenative (must comment) Choice offered to / list presented to : Adult Children  Discharge Placement              Patient chooses bed at: [facility] Patient to be transferred to facility by: [place] Name of family member notified: [name] Patient and family notified of of transfer: 09/28/20  Discharge Plan and Services In-house Referral: Clinical Social Work Discharge Planning Services: CM Consult Post Acute Care Choice: [place]                               Social Determinants of Health (SDOH) Interventions     Readmission Risk Interventions No flowsheet data found.

## 2020-09-28 NOTE — Progress Notes (Signed)
[facility] came and picked a patient up. 2000 scheduled medications given. Call Teodoro Kil and notified RN Joellen Jersey).

## 2020-09-28 NOTE — Hospital Course (Addendum)
Jacqueline Goodman is a 70 y.o. female presenting with failure to thrive and incidentally found to be COVID-19 positive. PMH is significant for hyperlipidemia, hypertension, type 2 diabetes.   Failure to thrive  Protein calorie malnutrition Patient admitted on 1/28 with fatigue and weakness concerning for failure to thrive and protein calorie malnutrition. Admission albumin 2.7, ranged from 2.1 to 2.7 throughout hospital stay. She was treated with PROsource Plus liquid feeding supplementation BID, Ensure Enlive TID, and multivitamin with minerals. Suggest continuation of these after discharge.   COVID-19 Found to be Covid positive on admission 09/23/2020.   Daughter indicates symptoms started on 19th and have worsened; symptoms include fatigue, decreased appetitie and diarrhea. Patient afebrile and breathing well on room air throughout admission. Received five day course of Remdesivir. Stable on day of discharge with some subjective improvement in fatigue. No need for supplemental oxygen or outpatient medications. End of 10-day quarantine would be Monday, Feb. 8th.   AKI Admission creatinine 1.35, baseline per chart review believed to be around 1.0. Likely due to decreased PO intake secondary to current COVID infection. Home losartan held upon admission to avoid further AKI worsening. Creatinine decreased to nadir of 0.95 prior to discharge. Day of discharge, creatinine 1.13. Encourage PO hydration and restarting losartan upon discharge with close PCP follow up.   Chronic conditions stable during admission: History of elevated LFTs Hyperlipidemia Type 2 diabetes Hypertension

## 2020-09-28 NOTE — Discharge Instructions (Signed)
Dear Jacqueline Goodman,  Thank you for letting us participate in your care. You were hospitalized for weakness and fatigue related to COVID.   POST-HOSPITAL & CARE INSTRUCTIONS 1. Go to the ED if you develop difficulty breathing, shortness of breath, or blue lips.  2. You tested positive for COVID on 1/28. You will need to quarantine from friends and family members for 10 days, until Monday, Feb. 8th.  3. Stop taking the 60 units of insulin daily. Start taking 20 units instead per day.   4. Stop taking the glimepiride (amaryl).  5. Go to your follow up appointments (listed below).    DOCTOR'S APPOINTMENT   No future appointments.  Contact information for follow-up providers    Lind Covert, MD. Schedule an appointment as soon as possible for a visit in 1 week(s).   Specialty: Family Medicine Why: Make a hospital follow up appointment to see your primary care office within a week of discharge.  Contact information: [place] [address] [phone]            Contact information for after-discharge care    Destination    [facility] Preferred SNF .   Service: Skilled Nursing Contact information: [address] [place] [phone]                  Take care and be well!  [facility]  [place], [place] [address] [phone]     10 Things You Can Do to Manage Your COVID-19 Symptoms at Home If you have possible or confirmed COVID-19: 1. Stay home except to get medical care. 2. Monitor your symptoms carefully. If your symptoms get worse, call your healthcare provider immediately. 3. Get rest and stay hydrated. 4. If you have a medical appointment, call the healthcare provider ahead of time and tell them that you have or may have COVID-19. 5. For medical emergencies, call 911 and notify the dispatch  personnel that you have or may have COVID-19. 6. Cover your cough and sneezes with a tissue or use the inside of your elbow. 7. Wash your hands often with soap and water for at least 20 seconds or clean your hands with an alcohol-based hand sanitizer that contains at least 60% alcohol. 8. As much as possible, stay in a specific room and away from other people in your home. Also, you should use a separate bathroom, if available. If you need to be around other people in or outside of the home, wear a mask. 9. Avoid sharing personal items with other people in your household, like dishes, towels, and bedding. 10. Clean all surfaces that are touched often, like counters, tabletops, and doorknobs. Use household cleaning sprays or wipes according to the label instructions. [email or web address] 03/11/2020 This information is not intended to replace advice given to you by your health care provider. Make sure you discuss any questions you have with your health care provider. Document Revised: 06/27/2020 Document Reviewed: 06/27/2020 Elsevier Patient Education  2021 [employer].

## 2020-09-28 NOTE — Progress Notes (Signed)
Physical Therapy Treatment Patient Details Name: Jacqueline Goodman MRN: [id number] DOB: [birth year] Today's Date: 09/28/2020    History of Present Illness 70 y.o. female presenting with failure to thrive and found to be COVID-19 positive. PMH is significant for hyperlipidemia, hypertension, type 2 diabetes.    PT Comments    Pt tolerates treatment well despite anxiety related to fear of falling. Pt completes 5 sit to stand transfers with PT assistance due to LE weakness and imbalance. Pt with posterior lean, requiring PT verbal and tactile cues to facilitate a more erect posture. Pt is at a high risk for falls due to impaired strength, balance, cognition. Pt will benefit from continued acute PT POC to reduce falls risk. PT continues to recommend SNF placement.   Follow Up Recommendations  SNF;Supervision/Assistance - 24 hour     Equipment Recommendations  Hospital bed;Wheelchair (measurements PT);Wheelchair cushion (measurements PT);Other (comment) (mechanical lift)    Recommendations for Other Services       Precautions / Restrictions Precautions Precautions: Fall Restrictions Weight Bearing Restrictions: No    Mobility  Bed Mobility Overal bed mobility: Needs Assistance Bed Mobility: Supine to Sit;Sit to Supine     Supine to sit: Max assist;HOB elevated Sit to supine: Max assist      Transfers Overall transfer level: Needs assistance Equipment used: 1 person hand held assist Transfers: Stand Pivot Transfers Sit to Stand: Max assist;From elevated surface         General transfer comment: PT provides BUE support and L knee block  Ambulation/Gait                 Stairs             Wheelchair Mobility    Modified Rankin (Stroke Patients Only)       Balance Overall balance assessment: Needs assistance Sitting-balance support: Feet supported;No upper extremity supported Sitting balance-Leahy Scale: Fair     Standing balance support: Bilateral  upper extremity supported Standing balance-Leahy Scale: Poor Standing balance comment: modA once in standing, posterior lean, BUE support and L knee block                            Cognition Arousal/Alertness: Awake/alert Behavior During Therapy: Anxious Overall Cognitive Status: Impaired/Different from baseline Area of Impairment: Orientation;Memory;Attention;Following commands;Safety/judgement;Awareness;Problem solving                 Orientation Level: Disoriented to;Place;Situation;Time Current Attention Level: Selective Memory: Decreased short-term memory Following Commands: Follows one step commands with increased time Safety/Judgement: Decreased awareness of safety;Decreased awareness of deficits Awareness: Intellectual Problem Solving: Slow processing;Requires verbal cues;Requires tactile cues;Difficulty sequencing        Exercises      General Comments General comments (skin integrity, edema, etc.): VSS on RA, pt is very anxious      Pertinent Vitals/Pain Pain Assessment: No/denies pain    Home Living                      Prior Function            PT Goals (current goals can now be found in the care plan section) Acute Rehab PT Goals Patient Stated Goal: to improve strength Progress towards PT goals: Progressing toward goals    Frequency    Min 2X/week      PT Plan Current plan remains appropriate    Co-evaluation  AM-PAC PT "6 Clicks" Mobility   Outcome Measure  Help needed turning from your back to your side while in a flat bed without using bedrails?: A Lot Help needed moving from lying on your back to sitting on the side of a flat bed without using bedrails?: A Lot Help needed moving to and from a bed to a chair (including a wheelchair)?: A Lot Help needed standing up from a chair using your arms (e.g., wheelchair or bedside chair)?: A Lot Help needed to walk in hospital room?: Total Help needed  climbing 3-5 steps with a railing? : Total 6 Click Score: 10    End of Session   Activity Tolerance: Patient tolerated treatment well Patient left: in bed;with call bell/phone within reach;with bed alarm set Nurse Communication: Mobility status;Need for lift equipment PT Visit Diagnosis: Other abnormalities of gait and mobility (R26.89);Muscle weakness (generalized) (M62.81);Other symptoms and signs involving the nervous system (R29.898);Adult, failure to thrive (R62.7)     Time: 9449-6759 PT Time Calculation (min) (ACUTE ONLY): 27 min  Charges:  $Therapeutic Activity: 23-37 mins                     Zenaida Niece, PT, DPT Acute Rehabilitation Pager: [phone]    Zenaida Niece 09/28/2020, 12:44 PM

## 2020-09-29 ENCOUNTER — Other Ambulatory Visit: Payer: Self-pay | Admitting: *Deleted

## 2020-09-29 DIAGNOSIS — R627 Adult failure to thrive: Secondary | ICD-10-CM | POA: Diagnosis not present

## 2020-09-29 DIAGNOSIS — R531 Weakness: Secondary | ICD-10-CM | POA: Diagnosis not present

## 2020-09-29 DIAGNOSIS — U071 COVID-19: Secondary | ICD-10-CM | POA: Diagnosis not present

## 2020-09-29 DIAGNOSIS — E43 Unspecified severe protein-calorie malnutrition: Secondary | ICD-10-CM | POA: Diagnosis not present

## 2020-09-29 LAB — CULTURE, BLOOD (ROUTINE X 2)
Culture: NO GROWTH
Special Requests: ADEQUATE

## 2020-09-29 NOTE — Telephone Encounter (Signed)
Spoke w Glenard Haring.  Doing ok at [facility]

## 2020-09-29 NOTE — Patient Outreach (Signed)
Member screened for potential [facility] Care Management needs.  According to [facility] (Patient Pearletha Forge) member resides in [facility].   Communication sent to [place] to make aware writer is following transition plans and for potential [facility] Care Management needs.   Noted member's PCP is with [facility]. This practice has a [place] Management team.   Will follow for potential [facility] needs and collaborate as appropriate while member resides in SNF.    Marthenia Rolling, MSN, RN,BSN [place] Acute Care Coordinator [phone] [facility]) [phone]  (Toll free office)

## 2020-10-01 ENCOUNTER — Other Ambulatory Visit: Payer: Self-pay | Admitting: Family Medicine

## 2020-10-01 NOTE — Progress Notes (Signed)
The daughter of Jacqueline Goodman called the after-hours line to express concerns about her mother's care at the skilled nursing facility.  She reported that she was worried that her mother is being neglected.  Specifically she has concerns about her mother's nutrition.  Her mother has not been able to eat solid food for quite some time and yet the skilled nursing facility has provided her mother with solid food as opposed to soft food options or protein supplement shakes.  She has further concerns that her mother's health may in fact be declining since hospital discharge.  She was encouraged to speak directly with the skilled nursing facility about her nutritional concerns or about possibly having her mother brought to the emergency room if she thought a new evaluation would be helpful.  She was also encouraged to speak with the primary care provider if any further questions arise.  Matilde Haymaker, MD

## 2020-10-03 DIAGNOSIS — F413 Other mixed anxiety disorders: Secondary | ICD-10-CM | POA: Diagnosis not present

## 2020-10-03 DIAGNOSIS — F0390 Unspecified dementia without behavioral disturbance: Secondary | ICD-10-CM | POA: Diagnosis not present

## 2020-10-03 DIAGNOSIS — G4700 Insomnia, unspecified: Secondary | ICD-10-CM | POA: Diagnosis not present

## 2020-10-03 DIAGNOSIS — F32A Depression, unspecified: Secondary | ICD-10-CM | POA: Diagnosis not present

## 2020-10-03 NOTE — Progress Notes (Signed)
Received notification from [place] regarding approval for OZEMPIC 2MG /1.5ML & Tresiba 200u/ml. Patient assistance approved until 07/26/21.  Phone: [phone]

## 2020-10-04 DIAGNOSIS — U071 COVID-19: Secondary | ICD-10-CM | POA: Diagnosis not present

## 2020-10-04 DIAGNOSIS — E43 Unspecified severe protein-calorie malnutrition: Secondary | ICD-10-CM | POA: Diagnosis not present

## 2020-10-04 DIAGNOSIS — R531 Weakness: Secondary | ICD-10-CM | POA: Diagnosis not present

## 2020-10-04 DIAGNOSIS — R627 Adult failure to thrive: Secondary | ICD-10-CM | POA: Diagnosis not present

## 2020-10-06 DIAGNOSIS — F419 Anxiety disorder, unspecified: Secondary | ICD-10-CM | POA: Diagnosis not present

## 2020-10-08 ENCOUNTER — Encounter (HOSPITAL_COMMUNITY): Payer: Self-pay | Admitting: Emergency Medicine

## 2020-10-08 ENCOUNTER — Emergency Department (HOSPITAL_COMMUNITY): Payer: Medicare Other

## 2020-10-08 ENCOUNTER — Emergency Department (HOSPITAL_COMMUNITY)
Admission: EM | Admit: 2020-10-08 | Discharge: 2020-10-08 | Disposition: A | Discharge status: Home / Self Care | Payer: Medicare Other | Attending: Emergency Medicine | Admitting: Emergency Medicine

## 2020-10-08 ENCOUNTER — Other Ambulatory Visit: Payer: Self-pay

## 2020-10-08 DIAGNOSIS — F0391 Unspecified dementia with behavioral disturbance: Secondary | ICD-10-CM | POA: Diagnosis not present

## 2020-10-08 DIAGNOSIS — I6529 Occlusion and stenosis of unspecified carotid artery: Secondary | ICD-10-CM | POA: Diagnosis not present

## 2020-10-08 DIAGNOSIS — J189 Pneumonia, unspecified organism: Secondary | ICD-10-CM | POA: Diagnosis not present

## 2020-10-08 DIAGNOSIS — E119 Type 2 diabetes mellitus without complications: Secondary | ICD-10-CM | POA: Diagnosis not present

## 2020-10-08 DIAGNOSIS — Z043 Encounter for examination and observation following other accident: Secondary | ICD-10-CM | POA: Diagnosis not present

## 2020-10-08 DIAGNOSIS — W06XXXA Fall from bed, initial encounter: Secondary | ICD-10-CM | POA: Diagnosis not present

## 2020-10-08 DIAGNOSIS — I6782 Cerebral ischemia: Secondary | ICD-10-CM | POA: Diagnosis not present

## 2020-10-08 DIAGNOSIS — Z8616 Personal history of COVID-19: Secondary | ICD-10-CM | POA: Diagnosis not present

## 2020-10-08 DIAGNOSIS — I10 Essential (primary) hypertension: Secondary | ICD-10-CM | POA: Diagnosis not present

## 2020-10-08 DIAGNOSIS — Z794 Long term (current) use of insulin: Secondary | ICD-10-CM | POA: Diagnosis not present

## 2020-10-08 DIAGNOSIS — R7989 Other specified abnormal findings of blood chemistry: Secondary | ICD-10-CM | POA: Diagnosis not present

## 2020-10-08 DIAGNOSIS — E041 Nontoxic single thyroid nodule: Secondary | ICD-10-CM | POA: Diagnosis not present

## 2020-10-08 DIAGNOSIS — J320 Chronic maxillary sinusitis: Secondary | ICD-10-CM | POA: Diagnosis not present

## 2020-10-08 DIAGNOSIS — G319 Degenerative disease of nervous system, unspecified: Secondary | ICD-10-CM | POA: Diagnosis not present

## 2020-10-08 DIAGNOSIS — Z79899 Other long term (current) drug therapy: Secondary | ICD-10-CM | POA: Insufficient documentation

## 2020-10-08 DIAGNOSIS — W19XXXA Unspecified fall, initial encounter: Secondary | ICD-10-CM

## 2020-10-08 DIAGNOSIS — I700 Atherosclerosis of aorta: Secondary | ICD-10-CM | POA: Insufficient documentation

## 2020-10-08 DIAGNOSIS — M47812 Spondylosis without myelopathy or radiculopathy, cervical region: Secondary | ICD-10-CM | POA: Diagnosis not present

## 2020-10-08 DIAGNOSIS — Z0000 Encounter for general adult medical examination without abnormal findings: Secondary | ICD-10-CM | POA: Diagnosis not present

## 2020-10-08 DIAGNOSIS — Z9181 History of falling: Secondary | ICD-10-CM | POA: Diagnosis present

## 2020-10-08 DIAGNOSIS — I2510 Atherosclerotic heart disease of native coronary artery without angina pectoris: Secondary | ICD-10-CM | POA: Diagnosis not present

## 2020-10-08 LAB — CBC WITH DIFFERENTIAL/PLATELET
Abs Immature Granulocytes: 0.03 10*3/uL (ref 0.00–0.07)
Basophils Absolute: 0 10*3/uL (ref 0.0–0.1)
Basophils Relative: 0 %
Eosinophils Absolute: 0.2 10*3/uL (ref 0.0–0.5)
Eosinophils Relative: 2 %
HCT: 36.9 % (ref 36.0–46.0)
Hemoglobin: 12.1 g/dL (ref 12.0–15.0)
Immature Granulocytes: 0 %
Lymphocytes Relative: 15 %
Lymphs Abs: 1.3 10*3/uL (ref 0.7–4.0)
MCH: 28.9 pg (ref 26.0–34.0)
MCHC: 32.8 g/dL (ref 30.0–36.0)
MCV: 88.1 fL (ref 80.0–100.0)
Monocytes Absolute: 0.8 10*3/uL (ref 0.1–1.0)
Monocytes Relative: 9 %
Neutro Abs: 6.3 10*3/uL (ref 1.7–7.7)
Neutrophils Relative %: 74 %
Platelets: 291 10*3/uL (ref 150–400)
RBC: 4.19 MIL/uL (ref 3.87–5.11)
RDW: 12.8 % (ref 11.5–15.5)
WBC: 8.6 10*3/uL (ref 4.0–10.5)
nRBC: 0 % (ref 0.0–0.2)

## 2020-10-08 LAB — URINALYSIS, MICROSCOPIC (REFLEX): WBC, UA: >50 WBC/hpf (ref 0–5)

## 2020-10-08 LAB — COMPREHENSIVE METABOLIC PANEL
ALT: 44 U/L (ref 0–44)
AST: 43 U/L — ABNORMAL HIGH (ref 15–41)
Albumin: 2.5 g/dL — ABNORMAL LOW (ref 3.5–5.0)
Alkaline Phosphatase: 88 U/L (ref 38–126)
Anion gap: 13 (ref 5–15)
BUN: 48 mg/dL — ABNORMAL HIGH (ref 8–23)
CO2: 21 mmol/L — ABNORMAL LOW (ref 22–32)
Calcium: 8.6 mg/dL — ABNORMAL LOW (ref 8.9–10.3)
Chloride: 101 mmol/L (ref 98–111)
Creatinine, Ser: 1.29 mg/dL — ABNORMAL HIGH (ref 0.44–1.00)
GFR, Estimated: 45 mL/min — ABNORMAL LOW (ref >60)
Glucose, Bld: 337 mg/dL — ABNORMAL HIGH (ref 70–99)
Potassium: 4.2 mmol/L (ref 3.5–5.1)
Sodium: 135 mmol/L (ref 135–145)
Total Bilirubin: 0.8 mg/dL (ref 0.3–1.2)
Total Protein: 6 g/dL — ABNORMAL LOW (ref 6.5–8.1)

## 2020-10-08 LAB — URINALYSIS, ROUTINE W REFLEX MICROSCOPIC
Bilirubin Urine: NEGATIVE
Glucose, UA: NEGATIVE mg/dL
Ketones, ur: NEGATIVE mg/dL
Nitrite: NEGATIVE
Protein, ur: 100 mg/dL — AB
Specific Gravity, Urine: >1.03 — ABNORMAL HIGH (ref 1.005–1.030)
pH: 5.5 (ref 5.0–8.0)

## 2020-10-08 MED ORDER — CEPHALEXIN 500 MG PO CAPS: 500.0000 mg | ORAL_CAPSULE | Freq: Two times a day (BID) | ORAL | 0 refills | Status: AC

## 2020-10-08 NOTE — ED Notes (Signed)
Attempted report x2 to [facility] for pt's pending d/c

## 2020-10-08 NOTE — ED Provider Notes (Signed)
[facility] EMERGENCY DEPARTMENT Provider Note   CSN: [id number] Arrival date & time: 10/08/20  1054     History Chief Complaint  Patient presents with  . [place] is a 70 y.o. female.  70 y.o female with a PMH of DM, HTN, Dementia presents to the ED via EMS s/p Fall. Level 5 Caveat.   The history is provided by medical records.  Fall This is a new problem. The current episode started less than 1 hour ago.       Past Medical History:  Diagnosis Date  . Diabetes mellitus without complication ([name])   . Hemorrhoids   . Hypertension   . Pneumonia    as a child  . PONV (postoperative nausea and vomiting)   . Wound infection after surgery 08/2017    Patient Active Problem List   Diagnosis Date Noted  . Dementia with behavioral disturbance ([place]) 09/26/2020  . Delirium 09/26/2020  . Depression, recurrent ([place]) 09/26/2020  . AKI (acute kidney injury) ([place])   . FTT (failure to thrive) in adult   . Metabolic encephalopathy   . COVID-19 09/23/2020  . Elevated liver enzymes 08/17/2020  . Weight loss 03/15/2020  . Vertigo 05/12/2019  . Obesity 12/25/2017  . Pain in joint, ankle and foot 11/13/2017  . 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 Situational anxiety 03/13/2017  . Hyperlipidemia 07/05/2016  . Essential hypertension 06/28/2016  . Diabetes mellitus type 2 in obese ([place]) 06/07/2016    Past Surgical History:  Procedure Laterality Date  . ABDOMINAL HYSTERECTOMY     PARTIAL  . ABDOMINOPLASTY  07/2017   tummy tuck  . CHOLECYSTECTOMY    . COLONOSCOPY    . DEBRIDEMENT AND CLOSURE WOUND N/A 09/02/2017   Procedure: WOUND DEBRIDEMENT;  Surgeon: Youlanda Roys, MD;  Location: [facility];  Service: Plastics;  Laterality: N/A;  . INCISION AND DRAINAGE OF WOUND N/A 08/15/2017   Procedure: Ananias Pilgrim AND DEBRIDEMENT OF ABDOMINAL WOUND;  Surgeon: Youlanda Roys, MD;  Location: [facility];  Service: Plastics;  Laterality: N/A;  SPINAL      OB History   No obstetric history on file.     Family History  Problem Relation Age of Onset  . Cancer Mother   . Breast cancer Mother   . Heart disease Father   . Alcohol abuse Father   . Diabetes Father   . Alcohol abuse Sister   . Heart disease Sister     Social History   Tobacco Use  . Smoking status: Never Smoker  . Smokeless tobacco: Never Used  Vaping Use  . Vaping Use: Never used  Substance Use Topics  . Alcohol use: No  . Drug use: No    Home Medications Prior to Admission medications   Medication Sig Start Date End Date Taking? Authorizing Provider  cephALEXin (KEFLEX) 500 MG capsule Take 1 capsule (500 mg total) by mouth 2 (two) times daily for 7 days. 10/08/20 10/15/20 Yes Ona Roehrs, Beverley Fiedler, PA-C  acetaminophen (TYLENOL) 325 MG tablet Take 2 tablets (650 mg total) by mouth every 6 (six) hours as needed for mild pain or headache (fever >/= 101). 09/28/20   Ezequiel Essex, MD  atorvastatin (LIPITOR) 40 MG tablet TAKE 1 TABLET BY MOUTH EVERY DAY Patient taking differently: Take 40 mg by mouth daily. 03/07/20   Chambliss, Jeb Levering, MD  BESIVANCE 0.6 % SUSP Apply 1 drop to eye 3 (three) times daily. 04/15/20   [provider]  citalopram (CELEXA) 10 MG tablet Take 1 tablet (10 mg total) by mouth daily. 09/29/20   Ezequiel Essex, MD  famotidine (PEPCID) 20 MG tablet Take 1 tablet (20 mg total) by mouth at bedtime. 09/28/20   Ezequiel Essex, MD  feeding supplement (ENSURE ENLIVE / ENSURE PLUS) LIQD Take 237 mLs by mouth 3 (three) times daily between meals. 09/28/20   Ezequiel Essex, MD  insulin glargine (LANTUS) 100 UNIT/ML injection Inject 0.2 mLs (20 Units total) into the skin daily. 09/28/20   Ezequiel Essex, MD  Insulin Pen Needle (BD PEN NEEDLE NANO 2ND GEN) 32G X 4 MM MISC Use as directed once daily.  May substitute for needle that fits Kara Mead 03/09/20   Lind Covert, MD  Insulin Syringe-Needle U-100 (B-D INS SYR ULTRAFINE .3CC/31G) 31G X  5/16" 0.3 ML MISC Use as directed 03/07/20   Lind Covert, MD  losartan (COZAAR) 25 MG tablet TAKE 1 TABLET BY MOUTH EVERY DAY Patient taking differently: Take 25 mg by mouth daily. 08/02/20   Chambliss, Jeb Levering, MD  melatonin 3 MG TABS tablet Take 1 tablet (3 mg total) by mouth at bedtime. 09/28/20   Ezequiel Essex, MD  Multiple Vitamin (MULTIVITAMIN WITH MINERALS) TABS tablet Take 1 tablet by mouth daily. 09/28/20   Ezequiel Essex, MD  Nutritional Supplements (,FEEDING SUPPLEMENT, PROSOURCE PLUS) liquid Take 30 mLs by mouth 2 (two) times daily between meals. 09/28/20   Ezequiel Essex, MD  [facility] ULTRA test strip USE TO TEST BLOOD SUGAR ONCE EVERY MORNING BEFORE EATING. ICD-10 CODE: E11.9 03/11/20   Lind Covert, MD  polyethylene glycol (MIRALAX / GLYCOLAX) 17 g packet Take 17 g by mouth daily as needed for mild constipation. 09/28/20   Ezequiel Essex, MD  Semaglutide,0.25 or 0.5MG /DOS, (OZEMPIC, 0.25 OR 0.5 MG/DOSE,) 2 MG/1.5ML SOPN Inject 0.5 mg into the skin once a week. Patient taking differently: Inject 0.25 mg into the skin once a week. Monday 08/17/20   Lind Covert, MD    Allergies    Morphine and related and Tape  Review of Systems   Review of Systems  Unable to perform ROS: Dementia    Physical Exam Updated Vital Signs BP 133/63   Pulse 77   Temp 98.3 F (36.8 C) (Axillary)   Resp 18   Ht 5' 8.5" (1.74 m)   Wt 113 kg   SpO2 91%   BMI 37.33 kg/m   Physical Exam Vitals and nursing note reviewed.  Constitutional:      Appearance: Normal appearance.  HENT:     Head: Normocephalic and atraumatic.     Comments: No visible laceration, abrasion, or obvious deformity.     Mouth/Throat:     Mouth: Mucous membranes are moist.  Eyes:     Pupils: Pupils are equal, round, and reactive to light.  Neck:     Comments: C collar in place by EMS.  Pulmonary:     Effort: Pulmonary effort is normal.  Abdominal:     General: Abdomen is flat.  Skin:     General: Skin is warm and dry.  Neurological:     Mental Status: She is alert. Mental status is at baseline.     ED Results / Procedures / Treatments   Labs (all labs ordered are listed, but only abnormal results are displayed) Labs Reviewed  URINALYSIS, ROUTINE W REFLEX MICROSCOPIC - Abnormal; Notable for the following components:      Result Value   APPearance CLOUDY (*)  Specific Gravity, Urine >1.030 (*)    Hgb urine dipstick SMALL (*)    Protein, ur 100 (*)    Leukocytes,Ua MODERATE (*)    All other components within normal limits  COMPREHENSIVE METABOLIC PANEL - Abnormal; Notable for the following components:   CO2 21 (*)    Glucose, Bld 337 (*)    BUN 48 (*)    Creatinine, Ser 1.29 (*)    Calcium 8.6 (*)    Total Protein 6.0 (*)    Albumin 2.5 (*)    AST 43 (*)    GFR, Estimated 45 (*)    All other components within normal limits  URINALYSIS, MICROSCOPIC (REFLEX) - Abnormal; Notable for the following components:   Bacteria, UA RARE (*)    All other components within normal limits  URINE CULTURE  CBC WITH DIFFERENTIAL/PLATELET    EKG EKG Interpretation  Date/Time:  Saturday October 08 2020 11:03:19 EST Ventricular Rate:  74 PR Interval:    QRS Duration: 144 QT Interval:  457 QTC Calculation: 508 R Axis:   -57 Text Interpretation: Sinus rhythm RBBB and LAFB Confirmed by Madalyn Rob [phone]) on 10/08/2020 11:12:37 AM   Radiology CT Head Wo Contrast  Result Date: 10/08/2020 CLINICAL DATA:  [place] out of her bed.  COVID-19 positive 2 weeks ago. EXAM: CT HEAD WITHOUT CONTRAST CT CERVICAL SPINE WITHOUT CONTRAST TECHNIQUE: Multidetector CT imaging of the head and cervical spine was performed following the standard protocol without intravenous contrast. Multiplanar CT image reconstructions of the cervical spine were also generated. COMPARISON:  CT head dated September 24, 2020. FINDINGS: CT HEAD FINDINGS Brain: No evidence of acute infarction, hemorrhage,  hydrocephalus, extra-axial collection or mass lesion/mass effect. Stable atrophy and chronic microvascular ischemic changes. Vascular: Atherosclerotic vascular calcification of the carotid siphons. No hyperdense vessel. Skull: Normal. Negative for fracture or focal lesion. Sinuses/Orbits: No acute finding. Chronic complete right and partial left maxillary sinus opacification. New complete opacification of the right anterior ethmoid air cells and mild mucosal thickening in the right frontal sinus. The mastoid air cells are clear. Other: None. CT CERVICAL SPINE FINDINGS Alignment: Normal. Skull base and vertebrae: No acute fracture. No primary bone lesion or focal pathologic process. Soft tissues and spinal canal: No prevertebral fluid or swelling. No visible canal hematoma. Disc levels: Mild disc height loss and bilateral uncovertebral hypertrophy at C6-C7. Mild right facet arthropathy at C3-C4 and C4-C5. Upper chest: Several peripheral ground-glass densities in the right upper lobe. Aortic arch and branch vessel atherosclerotic vascular disease. Other: 8 mm hypodense nodule in the left thyroid lobe. Not clinically significant; no follow-up imaging recommended. IMPRESSION: 1. No acute intracranial abnormality. Stable atrophy and chronic microvascular ischemic changes. 2. No acute cervical spine fracture or traumatic listhesis. 3. Mild peripheral ground-glass densities in the right upper lobe likely reflect COVID-19 pneumonia given recent positivity. 4. Aortic Atherosclerosis (ICD10-I70.0). Electronically Signed   By: Titus Dubin M.D.   On: 10/08/2020 12:02   CT Cervical Spine Wo Contrast  Result Date: 10/08/2020 CLINICAL DATA:  [place] out of her bed.  COVID-19 positive 2 weeks ago. EXAM: CT HEAD WITHOUT CONTRAST CT CERVICAL SPINE WITHOUT CONTRAST TECHNIQUE: Multidetector CT imaging of the head and cervical spine was performed following the standard protocol without intravenous contrast. Multiplanar CT image  reconstructions of the cervical spine were also generated. COMPARISON:  CT head dated September 24, 2020. FINDINGS: CT HEAD FINDINGS Brain: No evidence of acute infarction, hemorrhage, hydrocephalus, extra-axial collection or mass lesion/mass effect. Stable atrophy  and chronic microvascular ischemic changes. Vascular: Atherosclerotic vascular calcification of the carotid siphons. No hyperdense vessel. Skull: Normal. Negative for fracture or focal lesion. Sinuses/Orbits: No acute finding. Chronic complete right and partial left maxillary sinus opacification. New complete opacification of the right anterior ethmoid air cells and mild mucosal thickening in the right frontal sinus. The mastoid air cells are clear. Other: None. CT CERVICAL SPINE FINDINGS Alignment: Normal. Skull base and vertebrae: No acute fracture. No primary bone lesion or focal pathologic process. Soft tissues and spinal canal: No prevertebral fluid or swelling. No visible canal hematoma. Disc levels: Mild disc height loss and bilateral uncovertebral hypertrophy at C6-C7. Mild right facet arthropathy at C3-C4 and C4-C5. Upper chest: Several peripheral ground-glass densities in the right upper lobe. Aortic arch and branch vessel atherosclerotic vascular disease. Other: 8 mm hypodense nodule in the left thyroid lobe. Not clinically significant; no follow-up imaging recommended. IMPRESSION: 1. No acute intracranial abnormality. Stable atrophy and chronic microvascular ischemic changes. 2. No acute cervical spine fracture or traumatic listhesis. 3. Mild peripheral ground-glass densities in the right upper lobe likely reflect COVID-19 pneumonia given recent positivity. 4. Aortic Atherosclerosis (ICD10-I70.0). Electronically Signed   By: Titus Dubin M.D.   On: 10/08/2020 12:02    Procedures Procedures   Medications Ordered in ED Medications - No data to display  ED Course  I have reviewed the triage vital signs and the nursing  notes.  Pertinent labs & imaging results that were available during my care of the patient were reviewed by me and considered in my medical decision making (see chart for details).  Clinical Course as of 10/08/20 1442  Sat Oct 08, 2020  1431 WBC, UA: >50 [JS]  1431 Chalmers Guest): MODERATE [JS]    Clinical Course User Index [JS] Janeece Fitting, PA-C   MDM Rules/Calculators/A&P   Patient with a past medical history of dementia presents to the ED via EMS status post fall.  Level 5 caveat due to dementia, will place call for rehab center in order to obtain collateral information.  Patient was placed in a c-collar by EMS.  11:35 AM Called placed to [facility] to obtain collateral information. Multiple calls attempted without successful response.   CT head/neck showed: 1. No acute intracranial abnormality. Stable atrophy and chronic  microvascular ischemic changes.  2. No acute cervical spine fracture or traumatic listhesis.  3. Mild peripheral ground-glass densities in the right upper lobe  likely reflect COVID-19 pneumonia given recent positivity.  4. Aortic Atherosclerosis (ICD10-I70.0).   12:38 PM Spoke to Daughter [name] who was called by Rehab center and told "mother fell out of bed and found on the floor, last checked 5 minutes prior to fall". Daughter states she saw patient yesterday, had a similar episode where patient rolled to the other side almost fell out of bed as she was moving.  According to daughter, there are no rales to her bed and patient is a fall risk.  She is currently not on any blood thinners.  Screening blood work obtain in order to rule out any metabolic issue causing worsening mental status.  CBC without any leukocytosis.  CMP without any lecture light abnormality.  Creatinine level slightly increased.  LFTs are within normal limits aside for some elevation of her AST.  UA with leukocytes, nitrite negative, greater than fifty white blood cell count but  rare bacteria.   2:34 PM Call placed to daughter [name] [phone] she was given the results of all work-up today.  She is aware that mother will return back to nursing facility after discharge from the ED.  She is agreeable of this at this time.   Portions of this note were generated with [employer]. Dictation errors may occur despite best attempts at proofreading.  Final Clinical Impression(s) / ED Diagnoses Final diagnoses:  Fall, initial encounter    Rx / DC Orders ED Discharge Orders         Ordered    cephALEXin (KEFLEX) 500 MG capsule  2 times daily        10/08/20 1442           Janeece Fitting, PA-C 10/08/20 1442    Lucrezia Starch, MD 10/09/20 1718

## 2020-10-08 NOTE — ED Triage Notes (Signed)
Pt BIB EMS from [facility]. Pt fell from hospital bed, was found on floor by ems. C-collar in place. Pt yells out at baseline. Pt a/o x3

## 2020-10-08 NOTE — ED Notes (Signed)
Asked pt to provide urine sample at this time. Pt agreed to try with purewick.

## 2020-10-08 NOTE — Discharge Instructions (Addendum)
Your laboratory results on today's visit were within normal limits.  Your urine did show some signs of infection, this was sent for culture.  I have prescribed a short course of antibiotics to help treat this infection, please take 1 tablet twice a day for the next 7 days.  The imaging such as head CT, neck CT were within normal limits.  These results were called to your daughter [name].

## 2020-10-08 NOTE — ED Notes (Signed)
Jacqueline Goodman,Daughter([facility]) [phone]

## 2020-10-08 NOTE — ED Notes (Signed)
Called [facility] [phone]

## 2020-10-10 ENCOUNTER — Telehealth: Payer: Self-pay

## 2020-10-10 DIAGNOSIS — F0390 Unspecified dementia without behavioral disturbance: Secondary | ICD-10-CM | POA: Diagnosis not present

## 2020-10-10 DIAGNOSIS — F39 Unspecified mood [affective] disorder: Secondary | ICD-10-CM | POA: Diagnosis not present

## 2020-10-10 DIAGNOSIS — F32A Depression, unspecified: Secondary | ICD-10-CM | POA: Diagnosis not present

## 2020-10-10 DIAGNOSIS — G4700 Insomnia, unspecified: Secondary | ICD-10-CM | POA: Diagnosis not present

## 2020-10-10 DIAGNOSIS — F413 Other mixed anxiety disorders: Secondary | ICD-10-CM | POA: Diagnosis not present

## 2020-10-10 NOTE — Telephone Encounter (Signed)
Transition Care Management Unsuccessful Follow-up Telephone Call  Date of discharge and from where:  10/08/20 from [facility]  Attempts:  1st Attempt  Reason for unsuccessful TCM follow-up call:  Left voice message

## 2020-10-11 LAB — URINE CULTURE: Culture: >=100000 — AB

## 2020-10-11 NOTE — Telephone Encounter (Signed)
Transition Care Management Unsuccessful Follow-up Telephone Call  Date of discharge and from where:  10/08/20 from [facility]  Attempts:  2nd Attempt  Reason for unsuccessful TCM follow-up call:  No answer/busy    Spoke to [employer] (Pt dtr) - stated that pt is in a rehab facility/long term care facility.

## 2020-10-12 DIAGNOSIS — N179 Acute kidney failure, unspecified: Secondary | ICD-10-CM | POA: Diagnosis not present

## 2020-10-12 DIAGNOSIS — R4189 Other symptoms and signs involving cognitive functions and awareness: Secondary | ICD-10-CM | POA: Diagnosis not present

## 2020-10-12 DIAGNOSIS — L304 Erythema intertrigo: Secondary | ICD-10-CM | POA: Diagnosis not present

## 2020-10-13 ENCOUNTER — Other Ambulatory Visit: Payer: Self-pay | Admitting: *Deleted

## 2020-10-13 NOTE — Patient Outreach (Signed)
[facility] Post- Acute Care Coordinator follow up.   Mrs. Allender remains in [facility]. Spoke with [place] who reports Mrs. Shevchenko has confusion. States the goal is for member to return home with family. No anticipated dc date at this time.   Will continue to follow transition plans/needs while member resides in SNF.    Marthenia Rolling, MSN, RN,BSN [place] Acute Care Coordinator [phone] [facility]) [phone]  (Toll free office)

## 2020-10-13 NOTE — Progress Notes (Signed)
Received 5 boxes of Ozempic, 1 box of Tresiba & 2 boxes of pen needles from [employer] for patient assistance. Meds are in fridge & needles will be bagged & labeled for patient on the counter.   Left VM with pt daughter about pickup & what to do about future fills since patient is in long term care facility.   My callback # [phone]

## 2020-10-17 ENCOUNTER — Telehealth: Payer: Self-pay

## 2020-10-17 ENCOUNTER — Other Ambulatory Visit: Payer: Self-pay | Admitting: Family Medicine

## 2020-10-17 DIAGNOSIS — F0390 Unspecified dementia without behavioral disturbance: Secondary | ICD-10-CM | POA: Diagnosis not present

## 2020-10-17 DIAGNOSIS — R630 Anorexia: Secondary | ICD-10-CM | POA: Diagnosis not present

## 2020-10-17 DIAGNOSIS — F39 Unspecified mood [affective] disorder: Secondary | ICD-10-CM | POA: Diagnosis not present

## 2020-10-17 DIAGNOSIS — G4700 Insomnia, unspecified: Secondary | ICD-10-CM | POA: Diagnosis not present

## 2020-10-17 DIAGNOSIS — E1169 Type 2 diabetes mellitus with other specified complication: Secondary | ICD-10-CM

## 2020-10-17 DIAGNOSIS — F32A Depression, unspecified: Secondary | ICD-10-CM | POA: Diagnosis not present

## 2020-10-17 DIAGNOSIS — F413 Other mixed anxiety disorders: Secondary | ICD-10-CM | POA: Diagnosis not present

## 2020-10-17 NOTE — Telephone Encounter (Signed)
Patients daughter calls nurse line reporting an unsafe environment at patients rehab facility. [name] reports her mother has bruises on her and often falls out of the bed due to no bed rails. [name] reports she went and saw her on Saturday and her mothers bed was not made properly, no bottom sheet. [name] states they just put a "crumpeled up pad underneath her to lay on." [name] reports there a multiple sores coming up on patient. [place] would like a call back from PCP to discuss. Will forward to PCP.

## 2020-10-17 NOTE — Telephone Encounter (Signed)
Wants to transfer her mom to [facility] due to care concerns  I expressed concern and will fill out forms

## 2020-10-18 DIAGNOSIS — N179 Acute kidney failure, unspecified: Secondary | ICD-10-CM | POA: Diagnosis not present

## 2020-10-18 DIAGNOSIS — F419 Anxiety disorder, unspecified: Secondary | ICD-10-CM | POA: Diagnosis not present

## 2020-10-18 DIAGNOSIS — N390 Urinary tract infection, site not specified: Secondary | ICD-10-CM | POA: Diagnosis not present

## 2020-10-18 DIAGNOSIS — R4189 Other symptoms and signs involving cognitive functions and awareness: Secondary | ICD-10-CM | POA: Diagnosis not present

## 2020-10-21 DIAGNOSIS — N390 Urinary tract infection, site not specified: Secondary | ICD-10-CM | POA: Diagnosis not present

## 2020-10-21 DIAGNOSIS — B373 Candidiasis of vulva and vagina: Secondary | ICD-10-CM | POA: Diagnosis not present

## 2020-10-21 DIAGNOSIS — L304 Erythema intertrigo: Secondary | ICD-10-CM | POA: Diagnosis not present

## 2020-10-21 DIAGNOSIS — I739 Peripheral vascular disease, unspecified: Secondary | ICD-10-CM | POA: Diagnosis not present

## 2020-10-25 ENCOUNTER — Other Ambulatory Visit: Payer: Self-pay | Admitting: *Deleted

## 2020-10-25 NOTE — Patient Outreach (Signed)
[facility] Post- Acute Care Coordinator follow up. Member screened for potential [facility] Care Management services.  Per [place] (Patient Jacqueline Goodman) member resides in [facility].   Communication sent to facility SW to inquire about transition plans/needs.    Marthenia Rolling, MSN, RN,BSN [place] Acute Care Coordinator [phone] [facility]) [phone]  (Toll free office)

## 2020-10-28 ENCOUNTER — Other Ambulatory Visit: Payer: Self-pay | Admitting: *Deleted

## 2020-10-28 NOTE — Patient Outreach (Signed)
[facility] Post- Acute Care Coordinator follow up. Member screened for potential [facility] needs.   Update received from [facility] SW indicating member's family decided to keep Jacqueline Goodman in [place] for ongoing therapy.   Will continue to follow for potential [facility] care coordination needs while Jacqueline Goodman resides in SNF. Member's PCP has [place] team.    Marthenia Rolling, MSN, RN,BSN [place] Acute Care Coordinator [phone] [facility]) [phone]  (Toll free office)

## 2020-10-31 DIAGNOSIS — F32A Depression, unspecified: Secondary | ICD-10-CM | POA: Diagnosis not present

## 2020-10-31 DIAGNOSIS — F413 Other mixed anxiety disorders: Secondary | ICD-10-CM | POA: Diagnosis not present

## 2020-10-31 DIAGNOSIS — R630 Anorexia: Secondary | ICD-10-CM | POA: Diagnosis not present

## 2020-10-31 DIAGNOSIS — F0390 Unspecified dementia without behavioral disturbance: Secondary | ICD-10-CM | POA: Diagnosis not present

## 2020-10-31 DIAGNOSIS — G4700 Insomnia, unspecified: Secondary | ICD-10-CM | POA: Diagnosis not present

## 2020-10-31 DIAGNOSIS — F39 Unspecified mood [affective] disorder: Secondary | ICD-10-CM | POA: Diagnosis not present

## 2020-11-04 DIAGNOSIS — R531 Weakness: Secondary | ICD-10-CM | POA: Diagnosis not present

## 2020-11-04 DIAGNOSIS — R627 Adult failure to thrive: Secondary | ICD-10-CM | POA: Diagnosis not present

## 2020-11-04 DIAGNOSIS — L304 Erythema intertrigo: Secondary | ICD-10-CM | POA: Diagnosis not present

## 2020-11-04 DIAGNOSIS — I739 Peripheral vascular disease, unspecified: Secondary | ICD-10-CM | POA: Diagnosis not present

## 2020-11-04 DIAGNOSIS — A000 Cholera due to Vibrio cholerae 01, biovar cholerae: Secondary | ICD-10-CM | POA: Diagnosis not present

## 2020-11-04 DIAGNOSIS — N390 Urinary tract infection, site not specified: Secondary | ICD-10-CM | POA: Diagnosis not present

## 2020-11-08 DIAGNOSIS — L89620 Pressure ulcer of left heel, unstageable: Secondary | ICD-10-CM | POA: Diagnosis not present

## 2020-11-14 ENCOUNTER — Other Ambulatory Visit: Payer: Self-pay | Admitting: *Deleted

## 2020-11-14 ENCOUNTER — Telehealth: Payer: Self-pay

## 2020-11-14 DIAGNOSIS — F0391 Unspecified dementia with behavioral disturbance: Secondary | ICD-10-CM

## 2020-11-14 DIAGNOSIS — R627 Adult failure to thrive: Secondary | ICD-10-CM

## 2020-11-14 DIAGNOSIS — E1159 Type 2 diabetes mellitus with other circulatory complications: Secondary | ICD-10-CM | POA: Diagnosis not present

## 2020-11-14 DIAGNOSIS — I152 Hypertension secondary to endocrine disorders: Secondary | ICD-10-CM | POA: Diagnosis not present

## 2020-11-14 NOTE — Telephone Encounter (Signed)
Patient's daughter LVM on nurse line requesting to speak to Dr. Erin Hearing. Called daughter back to further discuss. [name] is wanting patient to be transferred from [facility] into her home.   She is requesting DME orders for hospital bed, wheelchair and hoyer lift.   Please route message back to RN team once orders have been placed.   Talbot Grumbling, RN

## 2020-11-14 NOTE — Patient Outreach (Signed)
[place] Coordinator follow up. Member screened for potential [facility] Care Management needs.  Verified in Patient PIng that member transitioned to private pay at [facility].   No identifiable [facility] Care Management needs at this time.   Marthenia Rolling, MSN, RN,BSN [place] Acute Care Coordinator [phone] [facility]) [phone]  (Toll free office)

## 2020-11-15 DIAGNOSIS — L89150 Pressure ulcer of sacral region, unstageable: Secondary | ICD-10-CM | POA: Diagnosis not present

## 2020-11-15 DIAGNOSIS — N390 Urinary tract infection, site not specified: Secondary | ICD-10-CM | POA: Diagnosis not present

## 2020-11-15 DIAGNOSIS — L89620 Pressure ulcer of left heel, unstageable: Secondary | ICD-10-CM | POA: Diagnosis not present

## 2020-11-16 DIAGNOSIS — N390 Urinary tract infection, site not specified: Secondary | ICD-10-CM | POA: Diagnosis not present

## 2020-11-16 DIAGNOSIS — F0391 Unspecified dementia with behavioral disturbance: Secondary | ICD-10-CM | POA: Diagnosis not present

## 2020-11-16 DIAGNOSIS — R627 Adult failure to thrive: Secondary | ICD-10-CM | POA: Diagnosis not present

## 2020-11-16 DIAGNOSIS — E1159 Type 2 diabetes mellitus with other circulatory complications: Secondary | ICD-10-CM | POA: Diagnosis not present

## 2020-11-17 NOTE — Telephone Encounter (Signed)
Ms Moening is coming home today They have a WC, hosp bed and hoyer lift Have contracted with home aide for 8 hrs per day 7 days a week  Apparently Ms Villamar has heel sores and is not walking at all  Will order CM consult to help with family resources and guidance in setting up wound care  Thanks

## 2020-11-18 ENCOUNTER — Telehealth: Payer: Self-pay | Admitting: *Deleted

## 2020-11-18 ENCOUNTER — Other Ambulatory Visit: Payer: Self-pay | Admitting: Family Medicine

## 2020-11-18 DIAGNOSIS — Z794 Long term (current) use of insulin: Secondary | ICD-10-CM | POA: Diagnosis not present

## 2020-11-18 DIAGNOSIS — E440 Moderate protein-calorie malnutrition: Secondary | ICD-10-CM | POA: Diagnosis not present

## 2020-11-18 DIAGNOSIS — E785 Hyperlipidemia, unspecified: Secondary | ICD-10-CM | POA: Diagnosis not present

## 2020-11-18 DIAGNOSIS — E1151 Type 2 diabetes mellitus with diabetic peripheral angiopathy without gangrene: Secondary | ICD-10-CM | POA: Diagnosis not present

## 2020-11-18 DIAGNOSIS — L89152 Pressure ulcer of sacral region, stage 2: Secondary | ICD-10-CM | POA: Diagnosis not present

## 2020-11-18 DIAGNOSIS — L89616 Pressure-induced deep tissue damage of right heel: Secondary | ICD-10-CM | POA: Diagnosis not present

## 2020-11-18 DIAGNOSIS — N390 Urinary tract infection, site not specified: Secondary | ICD-10-CM | POA: Diagnosis not present

## 2020-11-18 DIAGNOSIS — M6259 Muscle wasting and atrophy, not elsewhere classified, multiple sites: Secondary | ICD-10-CM | POA: Diagnosis not present

## 2020-11-18 DIAGNOSIS — F064 Anxiety disorder due to known physiological condition: Secondary | ICD-10-CM | POA: Diagnosis not present

## 2020-11-18 DIAGNOSIS — L89624 Pressure ulcer of left heel, stage 4: Secondary | ICD-10-CM | POA: Diagnosis not present

## 2020-11-18 DIAGNOSIS — B354 Tinea corporis: Secondary | ICD-10-CM | POA: Diagnosis not present

## 2020-11-18 DIAGNOSIS — Z9181 History of falling: Secondary | ICD-10-CM | POA: Diagnosis not present

## 2020-11-18 DIAGNOSIS — G319 Degenerative disease of nervous system, unspecified: Secondary | ICD-10-CM | POA: Diagnosis not present

## 2020-11-18 DIAGNOSIS — L304 Erythema intertrigo: Secondary | ICD-10-CM | POA: Diagnosis not present

## 2020-11-18 DIAGNOSIS — E669 Obesity, unspecified: Secondary | ICD-10-CM | POA: Diagnosis not present

## 2020-11-18 DIAGNOSIS — I10 Essential (primary) hypertension: Secondary | ICD-10-CM | POA: Diagnosis not present

## 2020-11-18 DIAGNOSIS — U071 COVID-19: Secondary | ICD-10-CM | POA: Diagnosis not present

## 2020-11-18 DIAGNOSIS — E1169 Type 2 diabetes mellitus with other specified complication: Secondary | ICD-10-CM | POA: Diagnosis not present

## 2020-11-18 DIAGNOSIS — F0391 Unspecified dementia with behavioral disturbance: Secondary | ICD-10-CM | POA: Diagnosis not present

## 2020-11-18 NOTE — Addendum Note (Signed)
Addended by: Talbert Cage L on: 11/18/2020 03:33 PM   Modules accepted: Orders

## 2020-11-18 NOTE — Chronic Care Management (AMB) (Signed)
  Care Management   Outreach Note  11/18/2020 Name: Jacqueline Goodman MRN: [id number] DOB: 1950/11/12  Referred by: Lind Covert, MD Reason for referral : Care Coordination (Initial outreach to schedule referral with [facility] and Licensed Clinical SW )   An unsuccessful telephone outreach was attempted today. The patient was referred to the case management team for assistance with care management and care coordination.   Follow Up Plan: A HIPAA compliant phone message was left for the patient providing contact information and requesting a return call.  The care management team will reach out to the patient again over the next 3 days.  If patient returns call to provider office, please advise to call [place]* at [phone].*  [place] Management

## 2020-11-20 ENCOUNTER — Emergency Department (HOSPITAL_COMMUNITY): Payer: Medicare Other

## 2020-11-20 ENCOUNTER — Inpatient Hospital Stay (HOSPITAL_COMMUNITY)
Admission: EM | Admit: 2020-11-20 | Discharge: 2020-11-28 | DRG: 871 | Disposition: A | Discharge status: Hospice | Payer: Medicare Other | Attending: Family Medicine | Admitting: Family Medicine

## 2020-11-20 DIAGNOSIS — E785 Hyperlipidemia, unspecified: Secondary | ICD-10-CM | POA: Diagnosis present

## 2020-11-20 DIAGNOSIS — Z743 Need for continuous supervision: Secondary | ICD-10-CM | POA: Diagnosis not present

## 2020-11-20 DIAGNOSIS — K5900 Constipation, unspecified: Secondary | ICD-10-CM | POA: Diagnosis present

## 2020-11-20 DIAGNOSIS — Z20822 Contact with and (suspected) exposure to covid-19: Secondary | ICD-10-CM | POA: Diagnosis present

## 2020-11-20 DIAGNOSIS — Z9049 Acquired absence of other specified parts of digestive tract: Secondary | ICD-10-CM

## 2020-11-20 DIAGNOSIS — R069 Unspecified abnormalities of breathing: Secondary | ICD-10-CM | POA: Diagnosis not present

## 2020-11-20 DIAGNOSIS — R9431 Abnormal electrocardiogram [ECG] [EKG]: Secondary | ICD-10-CM | POA: Diagnosis not present

## 2020-11-20 DIAGNOSIS — L97429 Non-pressure chronic ulcer of left heel and midfoot with unspecified severity: Secondary | ICD-10-CM

## 2020-11-20 DIAGNOSIS — Z794 Long term (current) use of insulin: Secondary | ICD-10-CM

## 2020-11-20 DIAGNOSIS — K219 Gastro-esophageal reflux disease without esophagitis: Secondary | ICD-10-CM | POA: Diagnosis present

## 2020-11-20 DIAGNOSIS — Z8616 Personal history of COVID-19: Secondary | ICD-10-CM

## 2020-11-20 DIAGNOSIS — Z79890 Hormone replacement therapy (postmenopausal): Secondary | ICD-10-CM

## 2020-11-20 DIAGNOSIS — G9340 Encephalopathy, unspecified: Secondary | ICD-10-CM | POA: Diagnosis not present

## 2020-11-20 DIAGNOSIS — I499 Cardiac arrhythmia, unspecified: Secondary | ICD-10-CM | POA: Diagnosis not present

## 2020-11-20 DIAGNOSIS — Z7189 Other specified counseling: Secondary | ICD-10-CM | POA: Diagnosis not present

## 2020-11-20 DIAGNOSIS — Z885 Allergy status to narcotic agent status: Secondary | ICD-10-CM

## 2020-11-20 DIAGNOSIS — G928 Other toxic encephalopathy: Secondary | ICD-10-CM | POA: Diagnosis present

## 2020-11-20 DIAGNOSIS — Z90711 Acquired absence of uterus with remaining cervical stump: Secondary | ICD-10-CM

## 2020-11-20 DIAGNOSIS — N179 Acute kidney failure, unspecified: Secondary | ICD-10-CM | POA: Diagnosis not present

## 2020-11-20 DIAGNOSIS — Z66 Do not resuscitate: Secondary | ICD-10-CM | POA: Diagnosis not present

## 2020-11-20 DIAGNOSIS — F0391 Unspecified dementia with behavioral disturbance: Secondary | ICD-10-CM | POA: Diagnosis present

## 2020-11-20 DIAGNOSIS — N289 Disorder of kidney and ureter, unspecified: Secondary | ICD-10-CM

## 2020-11-20 DIAGNOSIS — N390 Urinary tract infection, site not specified: Secondary | ICD-10-CM | POA: Diagnosis not present

## 2020-11-20 DIAGNOSIS — R451 Restlessness and agitation: Secondary | ICD-10-CM | POA: Diagnosis not present

## 2020-11-20 DIAGNOSIS — L97419 Non-pressure chronic ulcer of right heel and midfoot with unspecified severity: Secondary | ICD-10-CM

## 2020-11-20 DIAGNOSIS — Z6837 Body mass index (BMI) 37.0-37.9, adult: Secondary | ICD-10-CM

## 2020-11-20 DIAGNOSIS — R6889 Other general symptoms and signs: Secondary | ICD-10-CM | POA: Diagnosis not present

## 2020-11-20 DIAGNOSIS — I4510 Unspecified right bundle-branch block: Secondary | ICD-10-CM | POA: Diagnosis present

## 2020-11-20 DIAGNOSIS — R4182 Altered mental status, unspecified: Secondary | ICD-10-CM | POA: Diagnosis not present

## 2020-11-20 DIAGNOSIS — Z811 Family history of alcohol abuse and dependence: Secondary | ICD-10-CM

## 2020-11-20 DIAGNOSIS — Z803 Family history of malignant neoplasm of breast: Secondary | ICD-10-CM | POA: Diagnosis not present

## 2020-11-20 DIAGNOSIS — R739 Hyperglycemia, unspecified: Secondary | ICD-10-CM | POA: Diagnosis not present

## 2020-11-20 DIAGNOSIS — Z79899 Other long term (current) drug therapy: Secondary | ICD-10-CM | POA: Diagnosis not present

## 2020-11-20 DIAGNOSIS — E1165 Type 2 diabetes mellitus with hyperglycemia: Secondary | ICD-10-CM | POA: Diagnosis present

## 2020-11-20 DIAGNOSIS — F03918 Unspecified dementia, unspecified severity, with other behavioral disturbance: Secondary | ICD-10-CM | POA: Diagnosis present

## 2020-11-20 DIAGNOSIS — Z833 Family history of diabetes mellitus: Secondary | ICD-10-CM

## 2020-11-20 DIAGNOSIS — N1330 Unspecified hydronephrosis: Secondary | ICD-10-CM | POA: Diagnosis not present

## 2020-11-20 DIAGNOSIS — E440 Moderate protein-calorie malnutrition: Secondary | ICD-10-CM | POA: Diagnosis present

## 2020-11-20 DIAGNOSIS — A419 Sepsis, unspecified organism: Secondary | ICD-10-CM

## 2020-11-20 DIAGNOSIS — Z91048 Other nonmedicinal substance allergy status: Secondary | ICD-10-CM

## 2020-11-20 DIAGNOSIS — F0151 Vascular dementia with behavioral disturbance: Secondary | ICD-10-CM | POA: Diagnosis present

## 2020-11-20 DIAGNOSIS — R404 Transient alteration of awareness: Secondary | ICD-10-CM | POA: Diagnosis not present

## 2020-11-20 DIAGNOSIS — Z8249 Family history of ischemic heart disease and other diseases of the circulatory system: Secondary | ICD-10-CM

## 2020-11-20 DIAGNOSIS — R627 Adult failure to thrive: Secondary | ICD-10-CM | POA: Diagnosis present

## 2020-11-20 DIAGNOSIS — K3189 Other diseases of stomach and duodenum: Secondary | ICD-10-CM | POA: Diagnosis not present

## 2020-11-20 DIAGNOSIS — K429 Umbilical hernia without obstruction or gangrene: Secondary | ICD-10-CM | POA: Diagnosis present

## 2020-11-20 DIAGNOSIS — J019 Acute sinusitis, unspecified: Secondary | ICD-10-CM | POA: Diagnosis not present

## 2020-11-20 DIAGNOSIS — B964 Proteus (mirabilis) (morganii) as the cause of diseases classified elsewhere: Secondary | ICD-10-CM | POA: Diagnosis present

## 2020-11-20 DIAGNOSIS — Z1623 Resistance to quinolones and fluoroquinolones: Secondary | ICD-10-CM | POA: Diagnosis present

## 2020-11-20 DIAGNOSIS — N136 Pyonephrosis: Secondary | ICD-10-CM | POA: Diagnosis present

## 2020-11-20 DIAGNOSIS — E1140 Type 2 diabetes mellitus with diabetic neuropathy, unspecified: Secondary | ICD-10-CM | POA: Diagnosis present

## 2020-11-20 DIAGNOSIS — R410 Disorientation, unspecified: Secondary | ICD-10-CM | POA: Diagnosis not present

## 2020-11-20 DIAGNOSIS — L8990 Pressure ulcer of unspecified site, unspecified stage: Secondary | ICD-10-CM | POA: Insufficient documentation

## 2020-11-20 DIAGNOSIS — A4159 Other Gram-negative sepsis: Secondary | ICD-10-CM | POA: Diagnosis present

## 2020-11-20 DIAGNOSIS — R279 Unspecified lack of coordination: Secondary | ICD-10-CM | POA: Diagnosis not present

## 2020-11-20 DIAGNOSIS — L89616 Pressure-induced deep tissue damage of right heel: Secondary | ICD-10-CM | POA: Diagnosis present

## 2020-11-20 DIAGNOSIS — L89153 Pressure ulcer of sacral region, stage 3: Secondary | ICD-10-CM | POA: Diagnosis present

## 2020-11-20 DIAGNOSIS — R6520 Severe sepsis without septic shock: Secondary | ICD-10-CM | POA: Diagnosis not present

## 2020-11-20 DIAGNOSIS — I10 Essential (primary) hypertension: Secondary | ICD-10-CM | POA: Diagnosis present

## 2020-11-20 DIAGNOSIS — E669 Obesity, unspecified: Secondary | ICD-10-CM | POA: Diagnosis present

## 2020-11-20 DIAGNOSIS — L89620 Pressure ulcer of left heel, unstageable: Secondary | ICD-10-CM | POA: Diagnosis present

## 2020-11-20 DIAGNOSIS — Z515 Encounter for palliative care: Secondary | ICD-10-CM

## 2020-11-20 DIAGNOSIS — E1169 Type 2 diabetes mellitus with other specified complication: Secondary | ICD-10-CM | POA: Diagnosis present

## 2020-11-20 DIAGNOSIS — F32A Depression, unspecified: Secondary | ICD-10-CM | POA: Diagnosis present

## 2020-11-20 DIAGNOSIS — L89312 Pressure ulcer of right buttock, stage 2: Secondary | ICD-10-CM

## 2020-11-20 DIAGNOSIS — R338 Other retention of urine: Secondary | ICD-10-CM | POA: Diagnosis not present

## 2020-11-20 DIAGNOSIS — N3289 Other specified disorders of bladder: Secondary | ICD-10-CM | POA: Diagnosis not present

## 2020-11-20 DIAGNOSIS — G9341 Metabolic encephalopathy: Secondary | ICD-10-CM | POA: Diagnosis not present

## 2020-11-20 DIAGNOSIS — R509 Fever, unspecified: Secondary | ICD-10-CM | POA: Diagnosis not present

## 2020-11-20 DIAGNOSIS — G319 Degenerative disease of nervous system, unspecified: Secondary | ICD-10-CM | POA: Diagnosis not present

## 2020-11-20 DIAGNOSIS — Z7984 Long term (current) use of oral hypoglycemic drugs: Secondary | ICD-10-CM

## 2020-11-20 LAB — URINALYSIS, ROUTINE W REFLEX MICROSCOPIC
Bilirubin Urine: NEGATIVE
Glucose, UA: NEGATIVE mg/dL
Ketones, ur: 5 mg/dL — AB
Nitrite: NEGATIVE
Protein, ur: 100 mg/dL — AB
RBC / HPF: >50 RBC/hpf — ABNORMAL HIGH (ref 0–5)
Specific Gravity, Urine: 1.018 (ref 1.005–1.030)
WBC, UA: >50 WBC/hpf — ABNORMAL HIGH (ref 0–5)
pH: 7 (ref 5.0–8.0)

## 2020-11-20 LAB — I-STAT VENOUS BLOOD GAS, ED
Acid-base deficit: 8 mmol/L — ABNORMAL HIGH (ref 0.0–2.0)
Bicarbonate: 15.8 mmol/L — ABNORMAL LOW (ref 20.0–28.0)
Calcium, Ion: 1.18 mmol/L (ref 1.15–1.40)
HCT: 33 % — ABNORMAL LOW (ref 36.0–46.0)
Hemoglobin: 11.2 g/dL — ABNORMAL LOW (ref 12.0–15.0)
O2 Saturation: 99 %
Potassium: 4.4 mmol/L (ref 3.5–5.1)
Sodium: 136 mmol/L (ref 135–145)
TCO2: 17 mmol/L — ABNORMAL LOW (ref 22–32)
pCO2, Ven: 28.1 mmHg — ABNORMAL LOW (ref 44.0–60.0)
pH, Ven: 7.359 (ref 7.250–7.430)
pO2, Ven: 155 mmHg — ABNORMAL HIGH (ref 32.0–45.0)

## 2020-11-20 LAB — CBC WITH DIFFERENTIAL/PLATELET
Abs Immature Granulocytes: 0.1 10*3/uL — ABNORMAL HIGH (ref 0.00–0.07)
Basophils Absolute: 0 10*3/uL (ref 0.0–0.1)
Basophils Relative: 0 %
Eosinophils Absolute: 0 10*3/uL (ref 0.0–0.5)
Eosinophils Relative: 0 %
HCT: 42.8 % (ref 36.0–46.0)
Hemoglobin: 14.2 g/dL (ref 12.0–15.0)
Immature Granulocytes: 1 %
Lymphocytes Relative: 5 %
Lymphs Abs: 0.5 10*3/uL — ABNORMAL LOW (ref 0.7–4.0)
MCH: 29.6 pg (ref 26.0–34.0)
MCHC: 33.2 g/dL (ref 30.0–36.0)
MCV: 89.2 fL (ref 80.0–100.0)
Monocytes Absolute: 0.4 10*3/uL (ref 0.1–1.0)
Monocytes Relative: 3 %
Neutro Abs: 10.4 10*3/uL — ABNORMAL HIGH (ref 1.7–7.7)
Neutrophils Relative %: 91 %
Platelets: 398 10*3/uL (ref 150–400)
RBC: 4.8 MIL/uL (ref 3.87–5.11)
RDW: 13.4 % (ref 11.5–15.5)
WBC: 11.4 10*3/uL — ABNORMAL HIGH (ref 4.0–10.5)
nRBC: 0 % (ref 0.0–0.2)

## 2020-11-20 LAB — COMPREHENSIVE METABOLIC PANEL
ALT: 32 U/L (ref 0–44)
AST: 35 U/L (ref 15–41)
Albumin: 2.5 g/dL — ABNORMAL LOW (ref 3.5–5.0)
Alkaline Phosphatase: 113 U/L (ref 38–126)
Anion gap: 17 — ABNORMAL HIGH (ref 5–15)
BUN: 102 mg/dL — ABNORMAL HIGH (ref 8–23)
CO2: 12 mmol/L — ABNORMAL LOW (ref 22–32)
Calcium: 8.7 mg/dL — ABNORMAL LOW (ref 8.9–10.3)
Chloride: 104 mmol/L (ref 98–111)
Creatinine, Ser: 2.44 mg/dL — ABNORMAL HIGH (ref 0.44–1.00)
GFR, Estimated: 21 mL/min — ABNORMAL LOW (ref >60)
Glucose, Bld: 342 mg/dL — ABNORMAL HIGH (ref 70–99)
Potassium: 5.3 mmol/L — ABNORMAL HIGH (ref 3.5–5.1)
Sodium: 133 mmol/L — ABNORMAL LOW (ref 135–145)
Total Bilirubin: 1.1 mg/dL (ref 0.3–1.2)
Total Protein: 7.1 g/dL (ref 6.5–8.1)

## 2020-11-20 LAB — BETA-HYDROXYBUTYRIC ACID: Beta-Hydroxybutyric Acid: 2.37 mmol/L — ABNORMAL HIGH (ref 0.05–0.27)

## 2020-11-20 LAB — PROTIME-INR
INR: 1.2 (ref 0.8–1.2)
Prothrombin Time: 15 seconds (ref 11.4–15.2)

## 2020-11-20 LAB — POC OCCULT BLOOD, ED: Fecal Occult Bld: NEGATIVE

## 2020-11-20 LAB — CBG MONITORING, ED: Glucose-Capillary: 317 mg/dL — ABNORMAL HIGH (ref 70–99)

## 2020-11-20 LAB — APTT: aPTT: 30 seconds (ref 24–36)

## 2020-11-20 LAB — LACTIC ACID, PLASMA
Lactic Acid, Venous: 1.6 mmol/L (ref 0.5–1.9)
Lactic Acid, Venous: 1.8 mmol/L (ref 0.5–1.9)

## 2020-11-20 MED ORDER — ACETAMINOPHEN 325 MG PO TABS
650.0000 mg | ORAL_TABLET | Freq: Four times a day (QID) | ORAL | Status: DC | PRN
  Administered 2020-11-22 – 2020-11-26 (×2): 650 mg via ORAL
  Filled 2020-11-20 (×2): qty 2

## 2020-11-20 MED ORDER — VANCOMYCIN HCL 2000 MG/400ML IV SOLN
2000.0000 mg | Freq: Once | INTRAVENOUS | Status: AC
  Administered 2020-11-20: 2000 mg via INTRAVENOUS
  Filled 2020-11-20: qty 400

## 2020-11-20 MED ORDER — LACTATED RINGERS IV BOLUS (SEPSIS)
1000.0000 mL | Freq: Once | INTRAVENOUS | Status: AC
  Administered 2020-11-20: 1000 mL via INTRAVENOUS

## 2020-11-20 MED ORDER — INSULIN ASPART 100 UNIT/ML ~~LOC~~ SOLN
8.0000 [IU] | Freq: Once | SUBCUTANEOUS | Status: AC
  Administered 2020-11-20: 8 [IU] via INTRAVENOUS

## 2020-11-20 MED ORDER — ENOXAPARIN SODIUM 40 MG/0.4ML ~~LOC~~ SOLN
40.0000 mg | SUBCUTANEOUS | Status: DC
  Administered 2020-11-21 – 2020-11-28 (×8): 40 mg via SUBCUTANEOUS
  Filled 2020-11-20 (×8): qty 0.4

## 2020-11-20 MED ORDER — ACETAMINOPHEN 650 MG RE SUPP
650.0000 mg | Freq: Once | RECTAL | Status: AC
  Administered 2020-11-20: 650 mg via RECTAL
  Filled 2020-11-20: qty 1

## 2020-11-20 MED ORDER — SODIUM CHLORIDE 0.9 % IV SOLN
2.0000 g | Freq: Once | INTRAVENOUS | Status: AC
  Administered 2020-11-20: 2 g via INTRAVENOUS
  Filled 2020-11-20: qty 2

## 2020-11-20 MED ORDER — CITALOPRAM HYDROBROMIDE 20 MG PO TABS
20.0000 mg | ORAL_TABLET | Freq: Every day | ORAL | Status: DC
  Administered 2020-11-22 – 2020-11-28 (×7): 20 mg via ORAL
  Filled 2020-11-20 (×7): qty 1

## 2020-11-20 MED ORDER — ACETAMINOPHEN 650 MG RE SUPP: 650.0000 mg | Freq: Four times a day (QID) | RECTAL | Status: DC | PRN

## 2020-11-20 MED ORDER — INSULIN ASPART 100 UNIT/ML ~~LOC~~ SOLN: 0.0000 [IU] | Freq: Three times a day (TID) | SUBCUTANEOUS | Status: DC

## 2020-11-20 MED ORDER — LACTATED RINGERS IV SOLN
INTRAVENOUS | Status: AC
  Administered 2020-11-21: 1000 mL via INTRAVENOUS
  Administered 2020-11-21: 500 mL via INTRAVENOUS

## 2020-11-20 MED ORDER — METRONIDAZOLE IN NACL 5-0.79 MG/ML-% IV SOLN
500.0000 mg | Freq: Once | INTRAVENOUS | Status: AC
  Administered 2020-11-20: 500 mg via INTRAVENOUS
  Filled 2020-11-20: qty 100

## 2020-11-20 MED ORDER — INSULIN DETEMIR 100 UNIT/ML ~~LOC~~ SOLN
10.0000 [IU] | Freq: Every day | SUBCUTANEOUS | Status: DC
  Administered 2020-11-21 (×2): 10 [IU] via SUBCUTANEOUS
  Filled 2020-11-20 (×4): qty 0.1

## 2020-11-20 MED ORDER — MELATONIN 3 MG PO TABS
3.0000 mg | ORAL_TABLET | Freq: Every day | ORAL | Status: DC
  Administered 2020-11-21 – 2020-11-27 (×7): 3 mg via ORAL
  Filled 2020-11-20 (×9): qty 1

## 2020-11-20 MED ORDER — INSULIN ASPART 100 UNIT/ML ~~LOC~~ SOLN
0.0000 [IU] | SUBCUTANEOUS | Status: DC
  Administered 2020-11-21: 4 [IU] via SUBCUTANEOUS

## 2020-11-20 MED ORDER — SODIUM CHLORIDE 0.9 % IV SOLN
2.0000 g | INTRAVENOUS | Status: DC
  Administered 2020-11-21: 2 g via INTRAVENOUS
  Filled 2020-11-20 (×2): qty 2

## 2020-11-20 MED ORDER — FAMOTIDINE 20 MG PO TABS
20.0000 mg | ORAL_TABLET | Freq: Every day | ORAL | Status: DC
  Administered 2020-11-21 – 2020-11-25 (×5): 20 mg via ORAL
  Filled 2020-11-20 (×5): qty 1

## 2020-11-20 MED ORDER — ATORVASTATIN CALCIUM 40 MG PO TABS
40.0000 mg | ORAL_TABLET | Freq: Every day | ORAL | Status: DC
  Administered 2020-11-21 – 2020-11-25 (×5): 40 mg via ORAL
  Filled 2020-11-20 (×5): qty 1

## 2020-11-20 MED ORDER — VANCOMYCIN VARIABLE DOSE PER UNSTABLE RENAL FUNCTION (PHARMACIST DOSING): Status: DC

## 2020-11-20 MED ORDER — LACTATED RINGERS IV BOLUS (SEPSIS)
500.0000 mL | Freq: Once | INTRAVENOUS | Status: AC
Start: 2020-11-20 — End: 2020-11-21
  Administered 2020-11-21: 500 mL via INTRAVENOUS

## 2020-11-20 NOTE — ED Triage Notes (Signed)
Pt arrives via EMS from home with complaints of abnormal breathing. Increased confusion from baseline. UTI currently and taking medication. Bed sores present.   BP 130/70 HR: 120 93% RA 101.0 tympanic   22lhand 20 RFA  500NS given

## 2020-11-20 NOTE — ED Notes (Signed)
Pt in room yelling for help and asking for someone. This goes on when no one is in the room or if someone is in the room

## 2020-11-20 NOTE — ED Notes (Signed)
Provider at bedside

## 2020-11-20 NOTE — H&P (Incomplete)
[facility] Admission History and Physical Service Pager: [phone]  Patient name: Jacqueline Goodman Medical record number: [id number] Date of birth: [birth year] Age: 70 y.o. Gender: female  Primary Care Provider: Lind Covert, MD Consultants: *** Code Status: *** which was confirmed with family if patient unable to confirm ***  Preferred Emergency Contact: ***  Chief Complaint: ***  Assessment and Plan: Jacqueline Goodman is a 70 y.o. female presenting with *** . PMH is significant for ***  *** Sepsis, likely urinary source Febrile, tachycardic, and tachypneic on admission. In the ED code sepsis was called and patient received ***L NS, ceftriaxone, azithromycin, and flagyl. Blood cultures were collected prior to antibiotic initiation, but urine was not collected before the antibiotics. If the urine culture is negative, it will not be able to guide antibiotic therapy. Of note, patient daughter reports that the SNF she was at did a urine culture and she was started on Augmentin on 3/25.  Head CT negative for acute intracranial abnormality. - Admit to FPTS, attending Dr. Owens Shark - Vitals per routine - Continuous cardiac telemetry and pulse ox - Supplemental O2 as needed to main saturations above ***% - Out of bed with assistance - Fall Precautions - PT/OT eval and treat - Tylenol PRN for fever and pain - F/u urine and blood cultures - Consider touching base with SNF regarding results of urine culture  AKI Likely secondary to current sepsis/infection - Holding losartan - Avoid nephrotoxic medications as able - Monitor with AM BMP  Hyperlipidemia: Last LDL 75 on 05/25/20***.  Home medications include atorvastatin 40 mg a day. -Continue home atorvastatin  Type 2 diabetes Last A1c 11.1 on 08/17/2020, will get a repeat.  Current CBG 342.  Home medications include glimepiride 2 mg tablet daily, Levemir 25U daily.** - F/u A1c - CBGs q4h - sSSI -  Holding home glimepiride and ozempic - Holding Lantus as pt currently NPO, will continue   Hypertension Home medications include losartan 25 mg/day - Holding home losartan due to AKI    Medications: Atorvastatin  Mirtazapine Citalopram Benadryl PRN Glimipiride Melatonin  Vit C Losartan Famotidine PRN Tylenol PRN    FEN/GI: *** Prophylaxis: ***  Disposition: ***  History of Present Illness:  Jacqueline Goodman is a 70 y.o. female presenting with fever and shortness of breath with altered mental status.  History provided entirely by patient's daughter:  Left hosp in January and went to snf. Didn't do well in snf and didn't trust the facility and wouldn't follow instructions or participate in therapy and she was discharged (last day was Thursday). Caregiver for 8h/day x7d/week. Friday the caregiver supervisor came for assessment and patient was not eating, was tired/lethargic and screaming out. She screams "no leave me alone" since she has gotten home and has been aversive to touch even from her daughter. This morning patient began to calm down after being up all night yelling. Called daughter by her cousins name and called out about being cold constantly. Patient was given benadryl and several hours later went to sleep and patient went ot check on her. Her eyes were half open and "breathing had a hitch to it". Last night was calling out "momma help me, momma I'm cold" but her mother has been passed away for several decades. Daughter reports that she has been having possible hallucinations at night.   Baseline per daughter patient communicated well in January with normal conversation but would repeat a lot of things and the daughter  started having to help her with the bills. Prior to this admission she would talk but if she became agitated she would yell at people. 2 weeks ago the patient's niece went to visit her and the patient was able to name and remember all family members and was  oriented to year and location, but 2 days ago was not able to remember people at all and didn't recognize her daughter. Patient has previously been able to state when she has had dysuria when she has had UTIs in the past. Has not walked since January.  Noted bedsores have had some clear drainage, no erythema around the area.   Decreased appetite and oral intake. Patient was started on Augmentin on Friday, per daughter the SNF had reportedly gotten a urine culture and was found to have a UTI. Daughter still had the pills and the imprint is of a 3  2 and X, per pill identification tool online it appears that it may have been Augmentin    Review Of Systems: Per HPI with the following additions:   Review of Systems  Unable to perform ROS: Dementia     Patient Active Problem List   Diagnosis Date Noted  . Dementia with behavioral disturbance ([place]) 09/26/2020  . Delirium 09/26/2020  . Depression, recurrent ([place]) 09/26/2020  . AKI (acute kidney injury) ([place])   . FTT (failure to thrive) in adult   . Metabolic encephalopathy   . COVID-19 09/23/2020  . Elevated liver enzymes 08/17/2020  . Weight loss 03/15/2020  . Vertigo 05/12/2019  . Obesity 12/25/2017  . Pain in joint, ankle and foot 11/13/2017  . 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 Situational anxiety 03/13/2017  . Hyperlipidemia 07/05/2016  . Essential hypertension 06/28/2016  . Diabetes mellitus type 2 in obese ([place]) 06/07/2016    Past Medical History: Past Medical History:  Diagnosis Date  . Diabetes mellitus without complication ([place])   . Hemorrhoids   . Hypertension   . Pneumonia    as a child  . PONV (postoperative nausea and vomiting)   . Wound infection after surgery 08/2017    Past Surgical History: Past Surgical History:  Procedure Laterality Date  . ABDOMINAL HYSTERECTOMY     PARTIAL  . ABDOMINOPLASTY  07/2017   tummy tuck  . CHOLECYSTECTOMY    . COLONOSCOPY    . DEBRIDEMENT AND CLOSURE WOUND N/A 09/02/2017    Procedure: WOUND DEBRIDEMENT;  Surgeon: Youlanda Roys, MD;  Location: [facility];  Service: Plastics;  Laterality: N/A;  . INCISION AND DRAINAGE OF WOUND N/A 08/15/2017   Procedure: Ananias Pilgrim AND DEBRIDEMENT OF ABDOMINAL WOUND;  Surgeon: Youlanda Roys, MD;  Location: [facility];  Service: Plastics;  Laterality: N/A;  SPINAL    Social History: Social History   Tobacco Use  . Smoking status: Never Smoker  . Smokeless tobacco: Never Used  Vaping Use  . Vaping Use: Never used  Substance Use Topics  . Alcohol use: No  . Drug use: No   Additional social history: ***  Please also refer to relevant sections of EMR.  Family History: Family History  Problem Relation Age of Onset  . Cancer Mother   . Breast cancer Mother   . Heart disease Father   . Alcohol abuse Father   . Diabetes Father   . Alcohol abuse Sister   . Heart disease Sister    (***If not completed, MUST add something in)  Allergies and Medications: Allergies  Allergen Reactions  . Morphine And Related  Other (See Comments)    "says doesn't work"  . Tape Rash    Breaks the skin down   No current facility-administered medications on file prior to encounter.   Current Outpatient Medications on File Prior to Encounter  Medication Sig Dispense Refill  . acetaminophen (TYLENOL) 325 MG tablet Take 2 tablets (650 mg total) by mouth every 6 (six) hours as needed for mild pain or headache (fever >/= 101). 30 tablet 0  . atorvastatin (LIPITOR) 40 MG tablet TAKE 1 TABLET BY MOUTH EVERY DAY (Patient taking differently: Take 40 mg by mouth daily.) 90 tablet 3  . BESIVANCE 0.6 % SUSP Apply 1 drop to eye 3 (three) times daily.    . citalopram (CELEXA) 10 MG tablet Take 1 tablet (10 mg total) by mouth daily. 30 tablet 1  . famotidine (PEPCID) 20 MG tablet Take 1 tablet (20 mg total) by mouth at bedtime. 30 tablet 1  . feeding supplement (ENSURE ENLIVE / ENSURE PLUS) LIQD Take 237 mLs by mouth 3 (three) times daily  between meals. 237 mL 12  . insulin glargine (LANTUS) 100 UNIT/ML injection Inject 0.2 mLs (20 Units total) into the skin daily. 10 mL 11  . Insulin Pen Needle (BD PEN NEEDLE NANO 2ND GEN) 32G X 4 MM MISC Use as directed once daily.  May substitute for needle that fits Basaglar Kwikpen 100 each 2  . Insulin Syringe-Needle U-100 (B-D INS SYR ULTRAFINE .3CC/31G) 31G X 5/16" 0.3 ML MISC Use as directed 100 each 1  . losartan (COZAAR) 25 MG tablet TAKE 1 TABLET BY MOUTH EVERY DAY (Patient taking differently: Take 25 mg by mouth daily.) 90 tablet 0  . melatonin 3 MG TABS tablet Take 1 tablet (3 mg total) by mouth at bedtime. 30 tablet 1  . Multiple Vitamin (MULTIVITAMIN WITH MINERALS) TABS tablet Take 1 tablet by mouth daily. 30 tablet 3  . Nutritional Supplements (,FEEDING SUPPLEMENT, PROSOURCE PLUS) liquid Take 30 mLs by mouth 2 (two) times daily between meals. 887 mL 3  . ONETOUCH ULTRA test strip USE TO TEST BLOOD SUGAR ONCE EVERY MORNING BEFORE EATING. ICD-10 CODE: E11.9 100 strip 3  . polyethylene glycol (MIRALAX / GLYCOLAX) 17 g packet Take 17 g by mouth daily as needed for mild constipation. 14 each 0  . Semaglutide,0.25 or 0.5MG /DOS, (OZEMPIC, 0.25 OR 0.5 MG/DOSE,) 2 MG/1.5ML SOPN Inject 0.5 mg into the skin once a week. (Patient taking differently: Inject 0.25 mg into the skin once a week. Monday) 1.5 mL 3    Objective: BP (!) 115/59   Pulse 90   Temp 98.3 F (36.8 C) (Oral)   Resp (!) 26   SpO2 94%  Exam: General: *** Eyes: *** ENTM: *** Neck: *** Cardiovascular: *** Respiratory: *** Gastrointestinal: *** MSK: *** Derm: *** Neuro: *** Psych: ***  Labs and Imaging: CBC BMET  Recent Labs  Lab 11/20/20 1700 11/20/20 2119  WBC 11.4*  --   HGB 14.2 11.2*  HCT 42.8 33.0*  PLT 398  --    Recent Labs  Lab 11/20/20 1700 11/20/20 2119  NA 133* 136  K 5.3* 4.4  CL 104  --   CO2 12*  --   BUN 102*  --   CREATININE 2.44*  --   GLUCOSE 342*  --   CALCIUM 8.7*  --       EKG: My own interpretation (not copied from electronic read) ***   ***  Rise Patience, DO 11/20/2020, 9:51 PM PGY-***, Larence Penning  [facility] pager: [phone], text pages welcome

## 2020-11-20 NOTE — ED Notes (Signed)
Received verbal report from [facility]

## 2020-11-20 NOTE — Progress Notes (Signed)
Code Sepsis initiated @ 16:39PM, Elink following.

## 2020-11-20 NOTE — Progress Notes (Signed)
Pharmacy Antibiotic Note  Jacqueline Goodman is a 70 y.o. female admitted on 11/20/2020 with sepsis.  Pharmacy has been consulted for Cefepime and Vancomycin dosing.      Temp (24hrs), Avg:101.6 F (38.7 C), Min:101.6 F (38.7 C), Max:101.6 F (38.7 C)  Recent Labs  Lab 11/20/20 1700  WBC 11.4*  CREATININE 2.44*  LATICACIDVEN 1.6    CrCl cannot be calculated (Unknown ideal weight.).    Allergies  Allergen Reactions  . Morphine And Related Other (See Comments)    "says doesn't work"  . Tape Rash    Breaks the skin down    Antimicrobials this admission: 3/27 Cefepime >>  3/27 Vancomycin >>   Dose adjustments this admission: N/a  Microbiology results: Pending   Plan:  - Cefepime 2g IV q24h - Vancomycin 2000mg  IV x 1 dose  - Will continue to dose vancomycin based on random levels due to AKI ( scr 2.44 baseline ~ 1.1) - Monitor patients renal function and urine output  - De-escalate ABX when appropriate   Thank you for allowing pharmacy to be a part of this patient's care.  Duanne Limerick PharmD. BCPS 11/20/2020 6:40 PM

## 2020-11-20 NOTE — ED Notes (Signed)
Jacqueline Goodman daughter health care power of attorney [phone]

## 2020-11-20 NOTE — H&P (Addendum)
[facility] Admission History and Physical Service Pager: [phone]  Patient name: Jacqueline Goodman Medical record number: [id number] Date of birth: October 08, 1950 Age: 70 y.o. Gender: female  Primary Care Provider: Lind Covert, MD Consultants: None Code Status: Full (confirmed with daughter)  Preferred Emergency Contact:  Contact Information    Name Relation Home Work Mobile   Orgeron,Angela Daughter [phone]  [phone]   Keesha, Pellum   [phone]   Ivis, Nicolson [phone]  [phone]       Chief Complaint: Altered mental status  Assessment and Plan: Jacqueline Goodman is a 70 y.o. female presenting with abnormal breathing and confusion. PMH is significant for dementia, T2DM, HTN, HLD, depression.   Severe Sepsis, likely urinary source  Acute toxic-metabolic encephalopathy  Patient recently discharged from SNF on 3/24 with Augmentin for UTI (unknown urine culture results from SNF).  Daughter noted increasing confusion over past several days and abnormal breathing pattern this morning and presented to the ED where patient was febrile, tachycardic, and tachypneic on admission. In the ED code sepsis was called and patient received 3L LR, vancomycin, cefepime, and flagyl. Blood cultures were collected prior to antibiotic initiation, but urine was not collected before the antibiotics. However, U/A suspicious for infection with large leukocytes and many bacteria.  If the urine culture is negative, it will not be able to guide antibiotic therapy.  Last UC 09/2020 in epic showing pansensitive E. coli.  WBC mildly elevated at 11.4 with neutrophil #10.4, lactic acid 1.8. Head CT was collected in the ED due to concern for AMS, negative for acute intracranial abnormality (fortunately nonfocal exam per ED).  Increasing confusion possibly secondary to current sepsis, though cannot exclude worsening baseline functioning.  Patient has not had an MRI in  the hospital to evaluate confusion/dementia given patient's younger age of 69-especially in the setting of recent and significant decline. She has a non-focal neurologic exam. CXR without focal consolidation, less likely pneumonia.  Consideration that there could be another source of infection as patient has known sacral and heel pressure ulcers, have been unable to fully assess sacral region and will need more assistance to hold patient. Left heel ulcer does appear more chronic without signs of acute infection.  In the setting of recent UTI and recent decline, likely urosepsis in origin. Will obtain CT abdomen/pelvis to rule out perinephric abscess given that patient has worsened while on antibiotic therapy (presumptively that it was adequately treating).  - Admit to FPTS, attending Dr. Owens Shark - Vitals per routine - Continuous cardiac telemetry and pulse ox - Supplemental O2 as needed to main saturations above 92% - Cont IV Vanc, cefepime, and Flagyl (3/27-), de-escalate as possible - IVF maintenance  - Fall Precautions - CT ab/pelvis w/o contrast (not tolerating liquids/AKI)  - PT/OT eval and treat - Tylenol PRN for fever and pain - F/u urine and blood cultures - Consider touching base with SNF regarding results of urine culture -Unable to visualize bedsore during tonight's (and this morning) evaluation, please assess with more assistance and take picture for chart  AKI Creatinine elevated to 2.44, baseline 1.1.  Likely secondary to current sepsis/infection as well as decreased oral intake recently. Patient has received 3L LR boluses and will be on maintenance fluids, anticipate improvement on next check. - Holding home losartan - Avoid nephrotoxic medications as able - Monitor with AM BMP - IVF at 1100mL/h  Hyperglycemia  Type 2 diabetes CBG on admission 342, VBG obtained showed pH 7.359, bicarb  15.8.  BMP showed AG 17, CO2 12.  BHB 2.37.  Urine ketones 5 after fluids. Patient does not  appear to be in DKA given normal pH (VBG collected after fluids), suspect may have contributing ketosis in the setting of sepsis and poor oral intake. Last A1c 11.1 on 08/17/2020, will get a repeat.  Current CBG 342.  Home medications include glimepiride 2 mg tablet daily, Levemir 25U daily (reported from daughter on her current medications that she has been giving). - F/u A1c - CBGs q4h while NPO  - sSSI - Levemir 10U QHS (increase as needed)  - Holding home glimepiride indefinitely, ensure this is discontinued on discharge as well - Ozempic on home list within epic, however has not been on daughter's list, will clarify and hold for now    Dementia with behavior disturbance:  Patient currently very altered with mental status.  Per daughter's report, patient was able to converse very well several months ago, but since shortly prior hospitalization in January she has had progressive decline.  Became untrusting of SNF employees and began constantly yelling and refusing to participate.  Patient discharged home on 3/24 with continued decline and increased restlessness with calling out/yelling constantly throughout the night.  Consideration that there could be a vascular dementia given patient's relatively short timeline for large cognitive changes. She has no focal neurologic changes on exam.  -Consider MRI brain -May benefit from evaluation in [facility] geriatric clinic upon discharge -Delirium precautions, treatment of infection as above -Encourage daughter to be present when she can  Bilateral Heel Ulcers (L>R)  Sacral pressure ulcer:  Appear chronic in nature without acute evidence of infection within heel ulcer. Unable to fully visualize sacral ulcer with assistance of RN, will need repeated evaluation with more assistance. Daughter endorses that she has been changing dressings on a frequent (daily to every other day basis).   --Wound care consult  --[facility] [employer]  --Please assess sacral ulcer further    Hyperlipidemia: Last LDL 75 on 05/25/20.  Home medications include atorvastatin 40 mg a day. -Continue home atorvastatin  Hypertension Home medications include losartan 25 mg/day - Holding home losartan due to AKI and lower pressures with sepsis   Depression Home medications include Citalopram and Mirtazipine. - Continue home Citalopram - Hold home Mirtazepine until confirmed with med rec  GERD Home medications include famotidine as needed. -Continue home famotidine PRN  Moderate protein calorie malnutrition: Chronic. Previous admission for failure to thrive in 08/2020.  Continues to have intermittently poor intake. -Add Ensure shakes between meals when more alert  History of COVID-19 illness: 08/2020, required hospitalization predominantly for failure to thrive in the setting of this infection.  Appears to have chronic residual fatigue resulting from this.   FEN/GI: NPO, should have bedside swallow prior to reinitiating diet. Prophylaxis: Lovenox  Disposition: Progressive, inpatient  History of Present Illness:  Jacqueline Goodman is a 70 y.o. female presenting with fever and shortness of breath with altered mental status.  History provided entirely by patient's daughter as below.  Level 5 caveat due to altered mental status and baseline dementia.  She repeatedly yelled "no" or "leave me alone " when asked questions.  She was admitted to our service in 08/2020 due to failure to thrive and discharged to skilled nursing facility.  Didn't do well in snf and didn't trust the facility so wouldn't follow instructions or participate in therapy and she was discharged (last day was Thursday 3/24). Now has a home health aide for  8h/day x7d/week at her daughter's house. Friday the caregiver supervisor came for assessment and patient was not eating, was tired/lethargic and screaming out, but daughter felt she would improve after she was back in a comfortable environment. She screams "no leave me  alone" since she has gotten home and has been aversive to touch even from her daughter. This morning patient began to calm down after being up all night yelling. Called daughter by her cousins name and called out about being cold constantly. Patient was given benadryl and several hours later went to sleep and patient went ot check on her. Her eyes were half open and "breathing had a hitch to it". Last night was calling out "momma help me, momma I'm cold" but her mother has been passed away for several decades. Daughter reports that she has been having possible hallucinations at night for at least the past few months intermittently.   Baseline per daughter patient communicated well in January with normal conversation but would repeat a lot of things and the daughter started having to help her with the bills. Prior to this admission she would talk but if she became agitated she would yell at people. 2 weeks ago the patient's niece went to visit her and the patient was able to name and remember all family members and was oriented to year and location, but 2 days ago was not able to remember people at all and didn't recognize her daughter.  Patient has previously been able to state when she has had dysuria when she has had UTIs in the past. Has not walked since January due to heel sores and generalized decline/weakness.  Daughter has been watching noted bedsores, she noticed they have had some clear drainage with dressing change, no erythema around the area. Patient was started on Augmentin on Friday, per daughter the SNF had reportedly gotten a urine culture and was found to have a UTI. Daughter still had the pills and the imprint is of a 3  2 and X, per pill identification tool online it appears that it may have been Augmentin.  Decreased appetite and oral intake for the past few months.   Clarified current medications with daughter on phone.  ED Course: On arrival, she was noted to be febrile, tachypneic,  and tachycardic.  Code sepsis was initiated and given fluid boluses in addition to broad-spectrum antibiotics with vancomycin, cefepime, and Flagyl.  Tylenol given for fever.  Blood cultures were collected prior to antibiotic, however unclear if urine was collected prior to.  Labs notable for sodium 133, potassium 5.3, CO2 12, glucose 342, creatinine 2.4, anion gap 17, albumin 2.5.  Leukocytosis of 63.7 with neutrophilic shift, no anemia.  VBG pH 7.35, beta hydroxy 2.3.  CXR without focal consolidation, however does show low lung volumes with bronchovascular crowding.  CT head without acute intracranial abnormality, but with maxillary sinusitis changes.  Noted clinical improvement and vital stability with intervention as above.  Consulted for admission due to sepsis with suspected urinary source.  Review Of Systems: Per HPI with the following additions:   Review of Systems  Unable to perform ROS: Dementia     Patient Active Problem List   Diagnosis Date Noted  . Dementia with behavioral disturbance ([place]) 09/26/2020  . Delirium 09/26/2020  . Depression, recurrent ([place]) 09/26/2020  . AKI (acute kidney injury) ([place])   . FTT (failure to thrive) in adult   . Metabolic encephalopathy   . COVID-19 09/23/2020  . Elevated liver enzymes 08/17/2020  .  Weight loss 03/15/2020  . Vertigo 05/12/2019  . Obesity 12/25/2017  . Pain in joint, ankle and foot 11/13/2017  . 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 Situational anxiety 03/13/2017  . Hyperlipidemia 07/05/2016  . Essential hypertension 06/28/2016  . Diabetes mellitus type 2 in obese ([place]) 06/07/2016    Past Medical History: Past Medical History:  Diagnosis Date  . Diabetes mellitus without complication ([place])   . Hemorrhoids   . Hypertension   . Pneumonia    as a child  . PONV (postoperative nausea and vomiting)   . Wound infection after surgery 08/2017    Past Surgical History: Past Surgical History:  Procedure Laterality Date  .  ABDOMINAL HYSTERECTOMY     PARTIAL  . ABDOMINOPLASTY  07/2017   tummy tuck  . CHOLECYSTECTOMY    . COLONOSCOPY    . DEBRIDEMENT AND CLOSURE WOUND N/A 09/02/2017   Procedure: WOUND DEBRIDEMENT;  Surgeon: Youlanda Roys, MD;  Location: [facility];  Service: Plastics;  Laterality: N/A;  . INCISION AND DRAINAGE OF WOUND N/A 08/15/2017   Procedure: Ananias Pilgrim AND DEBRIDEMENT OF ABDOMINAL WOUND;  Surgeon: Youlanda Roys, MD;  Location: [facility];  Service: Plastics;  Laterality: N/A;  SPINAL    Social History: Social History   Tobacco Use  . Smoking status: Never Smoker  . Smokeless tobacco: Never Used  Vaping Use  . Vaping Use: Never used  Substance Use Topics  . Alcohol use: No  . Drug use: No   Additional social history:   Please also refer to relevant sections of EMR.  Family History: Family History  Problem Relation Age of Onset  . Cancer Mother   . Breast cancer Mother   . Heart disease Father   . Alcohol abuse Father   . Diabetes Father   . Alcohol abuse Sister   . Heart disease Sister     Allergies and Medications: Allergies  Allergen Reactions  . Morphine And Related Other (See Comments)    "says doesn't work"  . Tape Rash    Breaks the skin down   No current facility-administered medications on file prior to encounter.   Current Outpatient Medications on File Prior to Encounter  Medication Sig Dispense Refill  . acetaminophen (TYLENOL) 325 MG tablet Take 2 tablets (650 mg total) by mouth every 6 (six) hours as needed for mild pain or headache (fever >/= 101). 30 tablet 0  . atorvastatin (LIPITOR) 40 MG tablet TAKE 1 TABLET BY MOUTH EVERY DAY (Patient taking differently: Take 40 mg by mouth daily.) 90 tablet 3  . BESIVANCE 0.6 % SUSP Apply 1 drop to eye 3 (three) times daily.    . citalopram (CELEXA) 10 MG tablet Take 1 tablet (10 mg total) by mouth daily. 30 tablet 1  . famotidine (PEPCID) 20 MG tablet Take 1 tablet (20 mg total) by mouth at bedtime. 30  tablet 1  . feeding supplement (ENSURE ENLIVE / ENSURE PLUS) LIQD Take 237 mLs by mouth 3 (three) times daily between meals. 237 mL 12  . insulin glargine (LANTUS) 100 UNIT/ML injection Inject 0.2 mLs (20 Units total) into the skin daily. 10 mL 11  . Insulin Pen Needle (BD PEN NEEDLE NANO 2ND GEN) 32G X 4 MM MISC Use as directed once daily.  May substitute for needle that fits Basaglar Kwikpen 100 each 2  . Insulin Syringe-Needle U-100 (B-D INS SYR ULTRAFINE .3CC/31G) 31G X 5/16" 0.3 ML MISC Use as directed 100 each 1  . losartan (COZAAR)  25 MG tablet TAKE 1 TABLET BY MOUTH EVERY DAY (Patient taking differently: Take 25 mg by mouth daily.) 90 tablet 0  . melatonin 3 MG TABS tablet Take 1 tablet (3 mg total) by mouth at bedtime. 30 tablet 1  . Multiple Vitamin (MULTIVITAMIN WITH MINERALS) TABS tablet Take 1 tablet by mouth daily. 30 tablet 3  . Nutritional Supplements (,FEEDING SUPPLEMENT, PROSOURCE PLUS) liquid Take 30 mLs by mouth 2 (two) times daily between meals. 887 mL 3  . ONETOUCH ULTRA test strip USE TO TEST BLOOD SUGAR ONCE EVERY MORNING BEFORE EATING. ICD-10 CODE: E11.9 100 strip 3  . polyethylene glycol (MIRALAX / GLYCOLAX) 17 g packet Take 17 g by mouth daily as needed for mild constipation. 14 each 0  . Semaglutide,0.25 or 0.5MG /DOS, (OZEMPIC, 0.25 OR 0.5 MG/DOSE,) 2 MG/1.5ML SOPN Inject 0.5 mg into the skin once a week. (Patient taking differently: Inject 0.25 mg into the skin once a week. Monday) 1.5 mL 3    Objective: BP (!) 115/59   Pulse 90   Temp 98.3 F (36.8 C) (Oral)   Resp (!) 26   SpO2 94%  Exam: General -- patient supine in bed, initially sleeping and then began yelling "no" and "leave me alone" multiple times during exam and refused most of physical exam HEENT -- Head is normocephalic. Eyes closed during exam but patient opened upon command. Mouth with yellowing crusting in mouth Neck -- No visual edema or goiter noted Integument -- sores noted on b/l heels without  any surrounding erythema or warmth to touch Chest -- good expansion. Able to speak clearly.  Appears to be breathing comfortably on room air.  Clear on anterior auscultation bilaterally.  Cardiac -- RRR, no murmur appreciated Abdomen -- patient refused exam Neuro--resting comfortably.  Opens eyes on command.  Answers "no" or "leave me alone" to all questions.  Extremities- b/l heel ulcers noted, feet warm and dry with palpable dorsalis pedis pulses bilaterally.   On reevaluation as below:  General: More alert, NAD  Pulm: breathing comfortably on room air  Abdomen: Soft, Non-distended, horizontal well healed scar across central abdomen with underlying scar tissue, non-tender with palpation throughout all 4 quadrants and suprapubic region (no grimace, endorses no pain with each region).  Neuro: Alert, oriented to self only (not place or time, "not sure" and "1952"). States she must be at the hospital "because I wasn't feeling good and my daughter wanted me to be well". EOMI, PERRL, Smile symmetrical, speech easily understandable. Sensation to light touch intact through face and 4 extremities. Able to move all extremities spontaneously and equally. Finger to nose slightly dysmetric, but equally dysmetric bilaterally. Able to perform complex commands.  Derm: Heel pressure ulcers photos as below. No surrounding erythema or pus-like drainage. Left ulcer appears to be through fatty tissue, eschar present. Attempted to visualize known sacral pressure ulcer with help of RN, however could also get a small piece of dressing off prior to patient pushing her body back down.   Right Heel:     Left Heel:         Labs and Imaging: CBC BMET  Recent Labs  Lab 11/20/20 1700 11/20/20 2119  WBC 11.4*  --   HGB 14.2 11.2*  HCT 42.8 33.0*  PLT 398  --    Recent Labs  Lab 11/20/20 1700 11/20/20 2119  NA 133* 136  K 5.3* 4.4  CL 104  --   CO2 12*  --   BUN 102*  --  CREATININE 2.44*  --   GLUCOSE  342*  --   CALCIUM 8.7*  --      EKG: Sinus tachycardia at 114, RBB     Lilland, Alana, DO 11/20/2020, 9:51 PM PGY-1, [facility] pager: [phone], text pages welcome  FPTS Upper-Level Resident Addendum   I have independently interviewed and examined the patient. I have discussed the above with the original author and agree with their documentation. My edits for correction/addition/clarification are added. Please see also any attending notes.    Patriciaann Clan, DO  Family Medicine PGY-3

## 2020-11-20 NOTE — ED Provider Notes (Signed)
[facility] EMERGENCY DEPARTMENT Provider Note   CSN: [id number] Arrival date & time: 11/20/20  1628     History Chief Complaint  Patient presents with  . Code Sepsis    Jacqueline Goodman is a 70 y.o. female presenting for evaluation of abnormal breathing and confusion.  Level 5 caveat as patient is not speaking.  History obtained from EMS.  Per EMS, they were called out due to concerns for abnormal breathing.  On arrival, patient was breathing about 40 times a minute.  Per daughter, patient is more confused than baseline.  She recently was discharged from a SNF, is known to have a UTI and is taking medication.  Bedsores noted on bilateral heels and buttock.  With EMS, patient was febrile and tachycardic, fluids given.  Additional history obtained from chart review.  Patient with a history of dementia, diabetes, hypertension,  HPI     Past Medical History:  Diagnosis Date  . Diabetes mellitus without complication ([place])   . Hemorrhoids   . Hypertension   . Pneumonia    as a child  . PONV (postoperative nausea and vomiting)   . Wound infection after surgery 08/2017    Patient Active Problem List   Diagnosis Date Noted  . Dementia with behavioral disturbance ([place]) 09/26/2020  . Delirium 09/26/2020  . Depression, recurrent ([place]) 09/26/2020  . AKI (acute kidney injury) ([place])   . FTT (failure to thrive) in adult   . Metabolic encephalopathy   . COVID-19 09/23/2020  . Elevated liver enzymes 08/17/2020  . Weight loss 03/15/2020  . Vertigo 05/12/2019  . Obesity 12/25/2017  . Pain in joint, ankle and foot 11/13/2017  . 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 Situational anxiety 03/13/2017  . Hyperlipidemia 07/05/2016  . Essential hypertension 06/28/2016  . Diabetes mellitus type 2 in obese ([place]) 06/07/2016    Past Surgical History:  Procedure Laterality Date  . ABDOMINAL HYSTERECTOMY     PARTIAL  . ABDOMINOPLASTY  07/2017   tummy tuck  .  CHOLECYSTECTOMY    . COLONOSCOPY    . DEBRIDEMENT AND CLOSURE WOUND N/A 09/02/2017   Procedure: WOUND DEBRIDEMENT;  Surgeon: Youlanda Roys, MD;  Location: [facility];  Service: Plastics;  Laterality: N/A;  . INCISION AND DRAINAGE OF WOUND N/A 08/15/2017   Procedure: Ananias Pilgrim AND DEBRIDEMENT OF ABDOMINAL WOUND;  Surgeon: Youlanda Roys, MD;  Location: [facility];  Service: Plastics;  Laterality: N/A;  SPINAL     OB History   No obstetric history on file.     Family History  Problem Relation Age of Onset  . Cancer Mother   . Breast cancer Mother   . Heart disease Father   . Alcohol abuse Father   . Diabetes Father   . Alcohol abuse Sister   . Heart disease Sister     Social History   Tobacco Use  . Smoking status: Never Smoker  . Smokeless tobacco: Never Used  Vaping Use  . Vaping Use: Never used  Substance Use Topics  . Alcohol use: No  . Drug use: No    Home Medications Prior to Admission medications   Medication Sig Start Date End Date Taking? Authorizing Provider  acetaminophen (TYLENOL) 325 MG tablet Take 2 tablets (650 mg total) by mouth every 6 (six) hours as needed for mild pain or headache (fever >/= 101). 09/28/20   Ezequiel Essex, MD  atorvastatin (LIPITOR) 40 MG tablet TAKE 1 TABLET BY MOUTH EVERY DAY Patient taking differently:  Take 40 mg by mouth daily. 03/07/20   Chambliss, Jeb Levering, MD  BESIVANCE 0.6 % SUSP Apply 1 drop to eye 3 (three) times daily. 04/15/20   [provider]  citalopram (CELEXA) 10 MG tablet Take 1 tablet (10 mg total) by mouth daily. 09/29/20   Ezequiel Essex, MD  famotidine (PEPCID) 20 MG tablet Take 1 tablet (20 mg total) by mouth at bedtime. 09/28/20   Ezequiel Essex, MD  feeding supplement (ENSURE ENLIVE / ENSURE PLUS) LIQD Take 237 mLs by mouth 3 (three) times daily between meals. 09/28/20   Ezequiel Essex, MD  insulin glargine (LANTUS) 100 UNIT/ML injection Inject 0.2 mLs (20 Units total) into the skin daily. 09/28/20    Ezequiel Essex, MD  Insulin Pen Needle (BD PEN NEEDLE NANO 2ND GEN) 32G X 4 MM MISC Use as directed once daily.  May substitute for needle that fits Kara Mead 03/09/20   Lind Covert, MD  Insulin Syringe-Needle U-100 (B-D INS SYR ULTRAFINE .3CC/31G) 31G X 5/16" 0.3 ML MISC Use as directed 03/07/20   Lind Covert, MD  losartan (COZAAR) 25 MG tablet TAKE 1 TABLET BY MOUTH EVERY DAY Patient taking differently: Take 25 mg by mouth daily. 08/02/20   Chambliss, Jeb Levering, MD  melatonin 3 MG TABS tablet Take 1 tablet (3 mg total) by mouth at bedtime. 09/28/20   Ezequiel Essex, MD  Multiple Vitamin (MULTIVITAMIN WITH MINERALS) TABS tablet Take 1 tablet by mouth daily. 09/28/20   Ezequiel Essex, MD  Nutritional Supplements (,FEEDING SUPPLEMENT, PROSOURCE PLUS) liquid Take 30 mLs by mouth 2 (two) times daily between meals. 09/28/20   Ezequiel Essex, MD  [facility] ULTRA test strip USE TO TEST BLOOD SUGAR ONCE EVERY MORNING BEFORE EATING. ICD-10 CODE: E11.9 03/11/20   Lind Covert, MD  polyethylene glycol (MIRALAX / GLYCOLAX) 17 g packet Take 17 g by mouth daily as needed for mild constipation. 09/28/20   Ezequiel Essex, MD  Semaglutide,0.25 or 0.5MG /DOS, (OZEMPIC, 0.25 OR 0.5 MG/DOSE,) 2 MG/1.5ML SOPN Inject 0.5 mg into the skin once a week. Patient taking differently: Inject 0.25 mg into the skin once a week. Monday 08/17/20   Lind Covert, MD    Allergies    Morphine and related and Tape  Review of Systems   Review of Systems  Unable to perform ROS: Mental status change    Physical Exam Updated Vital Signs BP (!) 115/59   Pulse 90   Temp 98.3 F (36.8 C) (Oral)   Resp (!) 26   SpO2 94%   Physical Exam Vitals and nursing note reviewed. Exam conducted with a chaperone present.  Constitutional:      General: She is in acute distress.     Appearance: She is well-developed. She is ill-appearing.     Comments: Patient appears ill  HENT:     Head:  Normocephalic and atraumatic.  Eyes:     Extraocular Movements: Extraocular movements intact.     Conjunctiva/sclera: Conjunctivae normal.     Pupils: Pupils are equal, round, and reactive to light.  Cardiovascular:     Rate and Rhythm: Regular rhythm. Tachycardia present.     Pulses: Normal pulses.     Comments: Tachycardic around 115 Pulmonary:     Effort: Pulmonary effort is normal. No respiratory distress.     Breath sounds: Normal breath sounds. No wheezing.  Abdominal:     General: There is no distension.     Palpations: Abdomen is soft. There is no mass.  Tenderness: There is abdominal tenderness. There is no guarding or rebound.     Comments: Large surgical scar noted.  Tenderness palpation of the lower abdomen/suprapubic abdomen.  Genitourinary:    Comments: No gross rectal bleeding.  Hemoccult negative.  Wound of the right buttock., stage 2.  Musculoskeletal:        General: Normal range of motion.     Cervical back: Normal range of motion and neck supple.  Skin:    General: Skin is warm and dry.     Capillary Refill: Capillary refill takes less than 2 seconds.     Comments: Bilateral heel wounds that appear chronic, no erythema, warmth.  Neurological:     Mental Status: She is lethargic.     GCS: GCS eye subscore is 3. GCS verbal subscore is 5. GCS motor subscore is 5.     Comments: Alert to person, not place or event.  Very drowsy.  GCS 13     ED Results / Procedures / Treatments   Labs (all labs ordered are listed, but only abnormal results are displayed) Labs Reviewed  COMPREHENSIVE METABOLIC PANEL - Abnormal; Notable for the following components:      Result Value   Sodium 133 (*)    Potassium 5.3 (*)    CO2 12 (*)    Glucose, Bld 342 (*)    BUN 102 (*)    Creatinine, Ser 2.44 (*)    Calcium 8.7 (*)    Albumin 2.5 (*)    GFR, Estimated 21 (*)    Anion gap 17 (*)    All other components within normal limits  CBC WITH DIFFERENTIAL/PLATELET - Abnormal;  Notable for the following components:   WBC 11.4 (*)    Neutro Abs 10.4 (*)    Lymphs Abs 0.5 (*)    Abs Immature Granulocytes 0.10 (*)    All other components within normal limits  BETA-HYDROXYBUTYRIC ACID - Abnormal; Notable for the following components:   Beta-Hydroxybutyric Acid 2.37 (*)    All other components within normal limits  CBG MONITORING, ED - Abnormal; Notable for the following components:   Glucose-Capillary 317 (*)    All other components within normal limits  I-STAT VENOUS BLOOD GAS, ED - Abnormal; Notable for the following components:   pCO2, Ven 28.1 (*)    pO2, Ven 155.0 (*)    Bicarbonate 15.8 (*)    TCO2 17 (*)    Acid-base deficit 8.0 (*)    HCT 33.0 (*)    Hemoglobin 11.2 (*)    All other components within normal limits  RESP PANEL BY RT-PCR (FLU A&B, COVID) ARPGX2  CULTURE, BLOOD (ROUTINE X 2)  CULTURE, BLOOD (ROUTINE X 2)  URINE CULTURE  LACTIC ACID, PLASMA  LACTIC ACID, PLASMA  PROTIME-INR  APTT  URINALYSIS, ROUTINE W REFLEX MICROSCOPIC  POC OCCULT BLOOD, ED    EKG None  Radiology CT Head Wo Contrast  Result Date: 11/20/2020 CLINICAL DATA:  Mental status change, unknown cause EXAM: CT HEAD WITHOUT CONTRAST TECHNIQUE: Contiguous axial images were obtained from the base of the skull through the vertex without intravenous contrast. COMPARISON:  CT head 09/24/2020 FINDINGS: Brain: Cerebral ventricle sizes are concordant with the degree of cerebral volume loss. Patchy and confluent areas of decreased attenuation are noted throughout the deep and periventricular white matter of the cerebral hemispheres bilaterally, compatible with chronic microvascular ischemic disease. No evidence of large-territorial acute infarction. No parenchymal hemorrhage. No mass lesion. No extra-axial collection. No mass effect  or midline shift. No hydrocephalus. Basilar cisterns are patent. Vascular: No hyperdense vessel. Atherosclerotic calcifications are present within the  cavernous internal carotid arteries. Skull: No acute fracture or focal lesion. Sinuses/Orbits: Almost complete opacification of the right maxillary sinus as well as marked mucosal thickening of left maxillary sinus. High density material within bilateral maxillary sinuses. Mucosal thickening of the right ethmoid sinus. Otherwise the remaining paranasal sinuses and mastoid air cells are clear. Bilateral lens replacement. Otherwise orbits are unremarkable. Other: None. IMPRESSION: 1. No acute intracranial abnormality. 2. Almost complete opacification of the right maxillary sinus and marked mucosal thickening of the left maxillary sinus with associated high density within bilateral maxillary sinuses. Differential diagnosis includes fungal sinus disease, inspissated secretions, and less likely acute hemorrhage into sinus. Electronically Signed   By: Iven Finn M.D.   On: 11/20/2020 20:28   DG Chest Port 1 View  Result Date: 11/20/2020 CLINICAL DATA:  Questionable sepsis - evaluate for abnormality Abnormal breathing and confusion.  Fever. EXAM: PORTABLE CHEST 1 VIEW COMPARISON:  Radiograph 09/23/2020 FINDINGS: Lung volumes are low. Stable heart size and mediastinal contours, accentuated by low lung volumes. There is bronchovascular crowding. No focal consolidation. No pneumothorax or significant pleural effusion. No acute osseous abnormalities are seen. IMPRESSION: Low lung volumes with bronchovascular crowding. No evidence of focal consolidation. Electronically Signed   By: Keith Rake M.D.   On: 11/20/2020 17:22    Procedures .Critical Care Performed by: Franchot Heidelberg, PA-C Authorized by: Franchot Heidelberg, PA-C   Critical care provider statement:    Critical care time (minutes):  50   Critical care time was exclusive of:  Separately billable procedures and treating other patients and teaching time   Critical care was necessary to treat or prevent imminent or life-threatening deterioration  of the following conditions:  Endocrine crisis and sepsis   Critical care was time spent personally by me on the following activities:  Blood draw for specimens, development of treatment plan with patient or surrogate, evaluation of patient's response to treatment, examination of patient, obtaining history from patient or surrogate, ordering and performing treatments and interventions, ordering and review of laboratory studies, ordering and review of radiographic studies, pulse oximetry, re-evaluation of patient's condition and review of old charts   I assumed direction of critical care for this patient from another provider in my specialty: no     Care discussed with: admitting provider   Comments:     Patient presenting meeting SIRS criteria.  Sepsis antibiotics and fluids given.  Additionally, patient hyperglycemic.  Requiring admission.      Medications Ordered in ED Medications  lactated ringers infusion (has no administration in time range)  metroNIDAZOLE (FLAGYL) IVPB 500 mg (500 mg Intravenous New Bag/Given 11/20/20 2130)  lactated ringers bolus 1,000 mL (0 mLs Intravenous Stopped 11/20/20 2125)    And  lactated ringers bolus 1,000 mL (1,000 mLs Intravenous New Bag/Given 11/20/20 1845)    And  lactated ringers bolus 1,000 mL (1,000 mLs Intravenous New Bag/Given 11/20/20 2139)    And  lactated ringers bolus 500 mL (has no administration in time range)  ceFEPIme (MAXIPIME) 2 g in sodium chloride 0.9 % 100 mL IVPB (has no administration in time range)  vancomycin variable dose per unstable renal function (pharmacist dosing) (has no administration in time range)  ceFEPIme (MAXIPIME) 2 g in sodium chloride 0.9 % 100 mL IVPB (0 g Intravenous Stopped 11/20/20 1836)  vancomycin (VANCOREADY) IVPB 2000 mg/400 mL (0 mg Intravenous Stopped 11/20/20 2125)  acetaminophen (TYLENOL) suppository 650 mg (650 mg Rectal Given 11/20/20 1736)  insulin aspart (novoLOG) injection 8 Units (8 Units Intravenous  Given 11/20/20 1933)    ED Course  I have reviewed the triage vital signs and the nursing notes.  Pertinent labs & imaging results that were available during my care of the patient were reviewed by me and considered in my medical decision making (see chart for details).    MDM Rules/Calculators/A&P                          Patient presenting for evaluation of tachypnea and altered mental status.  On exam, patient appears drowsy and altered, GCS of 13.  Alert only to person.  She does have a tenderness palpation of the lower abdomen over the suprapubic region, concern for UTI as cause.  On exam is overall reassuring except for mild tachypnea.  Patient is febrile at 1-1.6, tachycardic, tachypneic, as such code sepsis was called.  Likely urinary source however will obtain chest x-ray.  Also consider DKA as patient is diabetic and tachypneic and altered.  Chest x-ray viewed interpreted by me, no pneumonia pneumothorax and effusion.  EKG shows sinus tach.  Labs show hyperglycemia with an elevated anion gap and decreased bicarb.  Will obtain VBG and beta hydroxybutyric acid to assess for DKA.  Lactic is normal, however white count is elevated 11.4.  As patient was febrile, will continue with sepsis orders.  Insulin ordered for hyperglycemia and mild hyperkalemia.  Patient will need to be admitted for further management and stabilization.   VBG without acidosis.  Well beta hydroxybutyric acid is minimally elevated, without very elevated glucose and with a normal pH, doubt DKA.  I do not feel patient needs to be on insulin pump.  Will admit to medicine.  Discussed with Family practice teaching service, pt to be admitted.   Final Clinical Impression(s) / ED Diagnoses Final diagnoses:  Sepsis with acute renal failure without septic shock, due to unspecified organism, unspecified acute renal failure type (HCC)  Hyperglycemia  Altered mental status, unspecified altered mental status type  Chronic heel  ulcer, right, with unspecified severity (HCC)  Chronic heel ulcer, left, with unspecified severity ([place])  Pressure injury of right buttock, stage 2 [facility])    Rx / DC Orders ED Discharge Orders    None       Franchot Heidelberg, PA-C 11/20/20 2158    Wyvonnia Dusky, MD 11/21/20 1038

## 2020-11-21 ENCOUNTER — Telehealth: Payer: Self-pay

## 2020-11-21 ENCOUNTER — Inpatient Hospital Stay (HOSPITAL_COMMUNITY): Payer: Medicare Other

## 2020-11-21 DIAGNOSIS — A419 Sepsis, unspecified organism: Secondary | ICD-10-CM | POA: Diagnosis not present

## 2020-11-21 DIAGNOSIS — N289 Disorder of kidney and ureter, unspecified: Secondary | ICD-10-CM | POA: Diagnosis not present

## 2020-11-21 DIAGNOSIS — R6520 Severe sepsis without septic shock: Secondary | ICD-10-CM

## 2020-11-21 DIAGNOSIS — R4182 Altered mental status, unspecified: Secondary | ICD-10-CM | POA: Diagnosis not present

## 2020-11-21 DIAGNOSIS — N179 Acute kidney failure, unspecified: Secondary | ICD-10-CM

## 2020-11-21 LAB — BLOOD CULTURE ID PANEL (REFLEXED) - BCID2

## 2020-11-21 LAB — CBC
HCT: 36 % (ref 36.0–46.0)
Hemoglobin: 12 g/dL (ref 12.0–15.0)
MCH: 29.7 pg (ref 26.0–34.0)
MCHC: 33.3 g/dL (ref 30.0–36.0)
MCV: 89.1 fL (ref 80.0–100.0)
Platelets: 331 10*3/uL (ref 150–400)
RBC: 4.04 MIL/uL (ref 3.87–5.11)
RDW: 13.5 % (ref 11.5–15.5)
WBC: 13.6 10*3/uL — ABNORMAL HIGH (ref 4.0–10.5)
nRBC: 0 % (ref 0.0–0.2)

## 2020-11-21 LAB — RESP PANEL BY RT-PCR (FLU A&B, COVID) ARPGX2
Influenza A by PCR: NEGATIVE
Influenza B by PCR: NEGATIVE
SARS Coronavirus 2 by RT PCR: NEGATIVE

## 2020-11-21 LAB — BASIC METABOLIC PANEL
Anion gap: 9 (ref 5–15)
BUN: 98 mg/dL — ABNORMAL HIGH (ref 8–23)
CO2: 18 mmol/L — ABNORMAL LOW (ref 22–32)
Calcium: 8.6 mg/dL — ABNORMAL LOW (ref 8.9–10.3)
Chloride: 108 mmol/L (ref 98–111)
Creatinine, Ser: 1.88 mg/dL — ABNORMAL HIGH (ref 0.44–1.00)
GFR, Estimated: 29 mL/min — ABNORMAL LOW (ref >60)
Glucose, Bld: 260 mg/dL — ABNORMAL HIGH (ref 70–99)
Potassium: 4.1 mmol/L (ref 3.5–5.1)
Sodium: 135 mmol/L (ref 135–145)

## 2020-11-21 LAB — CBG MONITORING, ED
Glucose-Capillary: 128 mg/dL — ABNORMAL HIGH (ref 70–99)
Glucose-Capillary: 142 mg/dL — ABNORMAL HIGH (ref 70–99)
Glucose-Capillary: 211 mg/dL — ABNORMAL HIGH (ref 70–99)
Glucose-Capillary: 241 mg/dL — ABNORMAL HIGH (ref 70–99)
Glucose-Capillary: 247 mg/dL — ABNORMAL HIGH (ref 70–99)
Glucose-Capillary: 73 mg/dL (ref 70–99)

## 2020-11-21 LAB — HEMOGLOBIN A1C
Hgb A1c MFr Bld: 8.9 % — ABNORMAL HIGH (ref 4.8–5.6)
Mean Plasma Glucose: 208.73 mg/dL

## 2020-11-21 LAB — ETHANOL: Alcohol, Ethyl (B): <10 mg/dL (ref <10)

## 2020-11-21 MED ORDER — INSULIN ASPART 100 UNIT/ML ~~LOC~~ SOLN: 0.0000 [IU] | SUBCUTANEOUS | Status: DC

## 2020-11-21 MED ORDER — INSULIN ASPART 100 UNIT/ML ~~LOC~~ SOLN
0.0000 [IU] | Freq: Three times a day (TID) | SUBCUTANEOUS | Status: DC
  Administered 2020-11-22: 3 [IU] via SUBCUTANEOUS
  Administered 2020-11-23 (×2): 2 [IU] via SUBCUTANEOUS
  Administered 2020-11-24 – 2020-11-25 (×5): 3 [IU] via SUBCUTANEOUS
  Administered 2020-11-25 – 2020-11-26 (×2): 5 [IU] via SUBCUTANEOUS
  Administered 2020-11-26: 15 [IU] via SUBCUTANEOUS

## 2020-11-21 MED ORDER — METRONIDAZOLE IN NACL 5-0.79 MG/ML-% IV SOLN
500.0000 mg | Freq: Three times a day (TID) | INTRAVENOUS | Status: DC
  Administered 2020-11-21: 500 mg via INTRAVENOUS
  Filled 2020-11-21: qty 100

## 2020-11-21 MED ORDER — COLLAGENASE 250 UNIT/GM EX OINT
TOPICAL_OINTMENT | Freq: Every day | CUTANEOUS | Status: DC
  Administered 2020-11-22 – 2020-11-23 (×2): 1 via TOPICAL
  Filled 2020-11-21 (×3): qty 30

## 2020-11-21 MED ORDER — LACTATED RINGERS IV SOLN: INTRAVENOUS | Status: DC

## 2020-11-21 MED ORDER — GLUCERNA SHAKE PO LIQD
237.0000 mL | Freq: Three times a day (TID) | ORAL | Status: DC
  Administered 2020-11-22 – 2020-11-28 (×16): 237 mL via ORAL
  Filled 2020-11-21 (×2): qty 237

## 2020-11-21 NOTE — Evaluation (Signed)
Clinical/Bedside Swallow Evaluation Patient Details  Name: Jacqueline Goodman MRN: [id number] Date of Birth: [birth year]  Today's Date: 11/21/2020 Time: SLP Start Time (ACUTE ONLY): 68 SLP Stop Time (ACUTE ONLY): 1253 SLP Time Calculation (min) (ACUTE ONLY): 13 min  Past Medical History:  Past Medical History:  Diagnosis Date  . Diabetes mellitus without complication ([place])   . Hemorrhoids   . Hypertension   . Pneumonia    as a child  . PONV (postoperative nausea and vomiting)   . Wound infection after surgery 08/2017   Past Surgical History:  Past Surgical History:  Procedure Laterality Date  . ABDOMINAL HYSTERECTOMY     PARTIAL  . ABDOMINOPLASTY  07/2017   tummy tuck  . CHOLECYSTECTOMY    . COLONOSCOPY    . DEBRIDEMENT AND CLOSURE WOUND N/A 09/02/2017   Procedure: WOUND DEBRIDEMENT;  Surgeon: Youlanda Roys, MD;  Location: [facility];  Service: Plastics;  Laterality: N/A;  . INCISION AND DRAINAGE OF WOUND N/A 08/15/2017   Procedure: Ananias Pilgrim AND DEBRIDEMENT OF ABDOMINAL WOUND;  Surgeon: Youlanda Roys, MD;  Location: [facility];  Service: Plastics;  Laterality: N/A;  SPINAL   HPI:  Jacqueline Goodman is a 70 y.o. female  who presented with abnormal breathing and confusion, admitted for acute toxic metabolic encephalopathy. PMH is significant for dementia, T2DM, HTN, HLD, depression. CXR shows Low lung volumes with bronchovascular crowding. No evidence of focal consolidation. No history of dysphagia found in chart.   Assessment / Plan / Recommendation Clinical Impression  Pt demonstrates no signs of aspiration. Her oral mucosa is very dry, which initially caused some difficulty with her labial seal on her straw, but once she had a few sips she did very well. She was able to masticate solids as well. Pt may resume regular solids and thin liquids. Will sign off. SLP Visit Diagnosis: Dysphagia, oral phase (R13.11)    Aspiration Risk  Mild aspiration risk    Diet Recommendation  Regular;Thin liquid   Liquid Administration via: Cup;Straw Medication Administration: Whole meds with liquid    Other  Recommendations     Follow up Recommendations        Frequency and Duration            Prognosis        Swallow Study   General HPI: Jacqueline Goodman is a 70 y.o. female  who presented with abnormal breathing and confusion, admitted for acute toxic metabolic encephalopathy. PMH is significant for dementia, T2DM, HTN, HLD, depression. CXR shows Low lung volumes with bronchovascular crowding. No evidence of focal consolidation. No history of dysphagia found in chart. Type of Study: Bedside Swallow Evaluation Diet Prior to this Study: Regular;Thin liquids Temperature Spikes Noted: No Respiratory Status: Room air History of Recent Intubation: No Behavior/Cognition: Alert Oral Cavity Assessment: Dry Oral Care Completed by SLP: No Oral Cavity - Dentition: Adequate natural dentition Vision: Functional for self-feeding Self-Feeding Abilities: Needs assist Patient Positioning: Partially reclined Baseline Vocal Quality: Normal Volitional Cough: Strong Volitional Swallow: Able to elicit    Oral/Motor/Sensory Function Overall Oral Motor/Sensory Function: Within functional limits   Ice Chips     Thin Liquid Thin Liquid: Within functional limits Presentation: Straw    Nectar Thick Nectar Thick Liquid: Not tested   Honey Thick Honey Thick Liquid: Not tested   Puree Puree: Within functional limits   Solid     Solid: Within functional limits      [name], Katherene Ponto 11/21/2020,1:15 PM

## 2020-11-21 NOTE — Consult Note (Signed)
WOC Nurse Consult Note: Reason for Consult:Unstageable pressure injury to left heel.  Deep tissue injury to right heel.  Stage 3 pressure injury to sacrum. All present on admission. Patient is confused.  She is yelling she is cold and is under multiple blankets.  After my assessment, I add one extra layer of a doubled up sheet.   Wound type:Pressure injuruies Pressure Injury POA: Yes Measurement:Left heel:  3 cm x 2 cm with 50% devitalized tissue to woundbed Right heel:  3 cmx 3 cm peeling epithelium with 0.5 cm maroon discoloration in the center.  Sacrum:  4 cm x 3 cm x 0.3 cm with dark red center Wound bed:see above Drainage (amount, consistency, odor) Heels are dry.  Sacrum with minimal serosanguinous  Periwound: Nonblanchable erythema to periwound on heels.  Dressing procedure/placement/frequency: Cleanse bilateral heels with soap and water.  Paint with betadine daily. Open to air.  Prevalon boots to bilateral heels to offload pressure.  Cleanse sacral wound with NS and pat dry. Apply Santyl to wound bed. Cover with NS moist gauze.  Secure with silicone foam dressing. Change Santyl and gauze daily. Change foam every three days and PRN soilage.    Will need bariatric low air loss bed.  Will not follow at this time.  Please re-consult if needed.  Domenic Moras MSN, RN, FNP-BC CWON Wound, Ostomy, Continence Nurse Pager [phone]

## 2020-11-21 NOTE — Progress Notes (Signed)
S: Night time check on patient. Patient currently resting comfortably.  O: Blood pressure 107/60, pulse 82, temperature 98.2 F (36.8 C), temperature source Axillary, resp. rate 20, height 5' 8.5" (1.74 m), weight 112.9 kg, SpO2 98 %. General: NAD, well-appearing, resting comfortably  Respiratory: good chest expansion, comfortable on room air, no increased WOB GU: foley in place with 855mL in the collection bag--orange urine color  A/P: Patient is currently well appearing and resting comfortably. Foley in place with good urine output. Orange urine output, likely secondary to a traumatic foley placement, will continue to monitor. Spoke with microbiology lab regarding result of staph species found on BCID and gram stain result given no current growth--they stated that the culture growth is to be update if something starts to grow then speciation will ultimately result from that. The BCID is an automatic result collected from the collection tube and then the culture is plated. - F/u urine cultures - Blood cultures ID panel shows staph species with no current growth on culture, will follow-up culture growth - Continue cefepime - Continue to monitor for fevers   [name], DO

## 2020-11-21 NOTE — ED Notes (Signed)
Patient transported to CT 

## 2020-11-21 NOTE — ED Notes (Signed)
Pt is yelling and screaming requesting water. Enter room and moisten mouth with swabs and pt starts complaining of choking on it. However pt continues to yell and scream that she wants water. Educated her multiple times on why she cannot have water.

## 2020-11-21 NOTE — ED Notes (Signed)
Patient transported to MRI 

## 2020-11-21 NOTE — Hospital Course (Addendum)
Jacqueline Goodman is a 70 y.o. female who presented with AMS, thought to be due to urosepsis and worsening dementia. PMH is significant for dementia, T2DM, HTN, HLD, depression. Below is her hospital course listed by problem.   Severe Urosepsis  In ED patient was tachycardic, tachypnic and febrile. Code sepsis called. Patient received 3L LR, blood cultures collected and she was started on vancomycin, cefepime and flagyl. Urine culture unfortunately collected after initiation of antibiotics. Vitals normalized after fluid administration. Lactic acid was normal, and WBC only mildly elevated to 11.4. Head CT negative for acute intracranial abnormality. UA with large leukocytes, >50 RBC, >50 WBC and many bacteria. Vanc and flagyl d/c on HOD #2 given primary concern for urinary source. Urine culture grew proteus mirabilis and abx transitioned to CTX on HOD#3 (3/29-3/30), eventually switched to Keflex 3/31 to continue 7 day course (end date 4/3). Repeat renal U/S showed moderate hydronephrosis despite bladder decompression. Urology consulted and felt this was likely chronic but also affected by large stool burden. Efforts were made to decrease stool burden and were successful. After discussion of risks and benefits with family, the decision was made to leave the foley catheter in place at discharge. Discharging home with hospice and family care.   Vascular Dementia  Poor intake  CT negative for acute abnormality. Given her step-wise and rapid decline, MRI brain was obtained and showed chronic microvascular changes consistent with vascular dementia. Patient was intermittently irritable and shouting but did not require any PRN medication for this. Delirium precautions maintained during hospitalization. Given decreased PO intake and cognitive decline, palliative care consult was made. After extensive family discussion, the patient was moved to DNR status and palliative care. Discharged home on hospice with family care.    Pressure Ulcers Noted on b/l heels L>R and sacrum on admission. Wound consult placed. Care was taken to these areas and moon boots applied to heels to offload pressure.   AKI Creatinine elevated 1.1>2.44 from HOD #1 to HOD #2. Likely due to dehydration (initially NPO until formal swallow eval performed) and vancomycin. Vancomycin was d/c on HOD #2. Fluids were maintained at maintenance rate. Creatinine improved to baseline by HOD #3 and fluids able to be discontinued by day #4 with stable creatinine. Creatinine 1.5 on d/c.   Stool Burden Appreciated on abdominal CT. Patient started on MiraLAX 17 g BID which was eventually increased to TID. Also given senna tablets. It was thought that stool burden was contributing to patients urinary obstruction and hydronephrosis.   Type 2 DM Hgb A1c 8.9. Initially there was concern for possible DKA given CBG elevated to 342 in ED but VBG with normal pH. AG was increased to 17, BHB 2.37 but thought to be due to sepsis and poor oral intake. Follow up VBG normalized and anion gap closed. Glimepride held on admission. Patient was started on Levemir 10U nightly but this eventually was held given CBG's <200 and several <100.   Other chronic conditions stable.

## 2020-11-21 NOTE — Progress Notes (Addendum)
Family Medicine Teaching Service Daily Progress Note Intern Pager: [phone]  Patient name: Jacqueline Goodman Medical record number: [id number] Date of birth: Dec 25, 1950 Age: 70 y.o. Gender: female  Primary Care Provider: Lind Covert, MD Consultants: None Code Status: FULL  Pt Overview and Major Events to Date:  3/27: Code sepsis activated in ED; admitted  Assessment and Plan: Jacqueline Goodman is a 70 y.o. female  who presented with abnormal breathing and confusion, admitted for acute toxic metabolic encephalopathy. PMH is significant for dementia, T2DM, HTN, HLD, depression.   Severe Sepsis, likely from urinary source Afebrile for the last 12 hours and no longer tachycardic, tachypnic or hypotensive. WBCs 11.4> 13.6.  Lactic acid normal and stable 1.6> 1.8. C/o diffuse abdominal pain on mild palpation of all quadrants. Alert and oriented to person, place but not to time or situation. Unsure of patients baseline at this time as she was unaccompanied. Will plan to reach out to daughter.  -Continue Cefepime (3/27-), Flagyl (3/27-), Vanc (3/27-) -Tylenol 650 mg PRN fever, mild pain -CT abd/pelv -F/u formal swallow eval -D/w daughter   Prerenal AKI: Improving Creatinine 2.44> 1.88.  Baseline 1.1. GFR 21>29. Likely prerenal given response to fluid hydration (s/p 3 LR boluses in ED, on maintenance IVF now). -Continue holding losartan -Avoid nephrotoxic agents as able -Continue IV fluids at 150 mL/hr -Intermittent Vanc dosing   Bilateral Heel Ulcers (L>R)  Sacral pressure ulcer Pressure ulcers appreciated on b/l heels, L>R. Additionally, patient has sacral ulcer that has dressing applied. Unable to appreciate this on examination as patient refused.  -Wound care consult  -Moon boots  -fall precautions   Hyperglycemia  Type 2 diabetes Hemoglobin A1c improved from previous check in December, 11.1> 8.9.  CBGs ranging 211-342, required 4U sSSI. Anion gap has normalized, 17>9. Home  medications include, glimepiride 2 mg daily, Levemir 25 units daily. -CBG AC/nightly -change to moderate sliding scale insulin -Continue Levemir 10U nightly; consider BID dosing tomorrow if CBG still elevated  -Holding glimepiride  Dementia with behavior disturbance Concern for vascular dementia given cognitive decline in short period of time.  Behavioral change could be acutely worsened given current infection.  If patient does not have improve with treatment, certainly should have more work-up for dementia. -MRI brain without contrast -Delirium precautions  Hyperlipidemia: Chronic, stable -Continue home atorvastatin 40 mg daily  Hypertension: Chronic, stable BPs ranging 95-126/53-77. Most recent 123/61. -Holding home losartan 2/2 AKI  GERD: Chronic, stable -Continue home famotidine as needed for reflux  Depression chronic, stable -Continue home citalopram  Moderate protein calorie malnutrition: Chronic. -Ensure shakes between meals if passes formal swallow study  History of COVID-19 illness (08/2020) Chronic residual fatigue.   FEN/GI: NPO until passes formal swallow study PPx: Lovenox   Status is: Inpatient  Remains inpatient appropriate because:Ongoing diagnostic testing needed not appropriate for outpatient work up, IV treatments appropriate due to intensity of illness or inability to take PO and Inpatient level of care appropriate due to severity of illness   Dispo: The patient is from: Home              Anticipated d/c is to: home vs. SNF              Patient currently is not medically stable to d/c.   Difficult to place patient No    Subjective:  Patient is unaccompanied this morning.  She is alert to person and place but not time or situation.  States it is 70.  Denies  any pain or complaints but does ask for water.  Intermittently shouting "no" and "stop". Does not want to be turned over, states "just let me sleep".   Objective: Temp:  [97.5 F (36.4  C)-101.6 F (38.7 C)] 97.5 F (36.4 C) (03/28 0338) Pulse Rate:  [69-114] 69 (03/28 0330) Resp:  [14-33] 14 (03/28 0330) BP: (95-126)/(53-77) 123/61 (03/28 0330) SpO2:  [93 %-97 %] 95 % (03/28 0330) Physical Exam: General: Awake, alert and oriented to person and place- not time or situation HEENT: normocephalic, dry mucous membranes Cardiovascular: RRR without murmur Respiratory: Breathing comfortably on room air Abdomen: diffuse tenderness throughout on mild palpation, no rebound/guarding, hypoactive bowel sounds, soft Extremities: warm and dry, 2+ radial and DP pulses b/l, b/l pressure ulcers L>R with eschar noted on left heel.   Laboratory: Recent Labs  Lab 11/20/20 1700 11/20/20 2119 11/21/20 0210  WBC 11.4*  --  13.6*  HGB 14.2 11.2* 12.0  HCT 42.8 33.0* 36.0  PLT 398  --  331   Recent Labs  Lab 11/20/20 1700 11/20/20 2119 11/21/20 0210  NA 133* 136 135  K 5.3* 4.4 4.1  CL 104  --  108  CO2 12*  --  18*  BUN 102*  --  98*  CREATININE 2.44*  --  1.88*  CALCIUM 8.7*  --  8.6*  PROT 7.1  --   --   BILITOT 1.1  --   --   ALKPHOS 113  --   --   ALT 32  --   --   AST 35  --   --   GLUCOSE 342*  --  260*    Imaging/Diagnostic Tests: CT Head Wo Contrast  Result Date: 11/20/2020 CLINICAL DATA:  Mental status change, unknown cause EXAM: CT HEAD WITHOUT CONTRAST TECHNIQUE: Contiguous axial images were obtained from the base of the skull through the vertex without intravenous contrast. COMPARISON:  CT head 09/24/2020 FINDINGS: Brain: Cerebral ventricle sizes are concordant with the degree of cerebral volume loss. Patchy and confluent areas of decreased attenuation are noted throughout the deep and periventricular white matter of the cerebral hemispheres bilaterally, compatible with chronic microvascular ischemic disease. No evidence of large-territorial acute infarction. No parenchymal hemorrhage. No mass lesion. No extra-axial collection. No mass effect or midline shift.  No hydrocephalus. Basilar cisterns are patent. Vascular: No hyperdense vessel. Atherosclerotic calcifications are present within the cavernous internal carotid arteries. Skull: No acute fracture or focal lesion. Sinuses/Orbits: Almost complete opacification of the right maxillary sinus as well as marked mucosal thickening of left maxillary sinus. High density material within bilateral maxillary sinuses. Mucosal thickening of the right ethmoid sinus. Otherwise the remaining paranasal sinuses and mastoid air cells are clear. Bilateral lens replacement. Otherwise orbits are unremarkable. Other: None. IMPRESSION: 1. No acute intracranial abnormality. 2. Almost complete opacification of the right maxillary sinus and marked mucosal thickening of the left maxillary sinus with associated high density within bilateral maxillary sinuses. Differential diagnosis includes fungal sinus disease, inspissated secretions, and less likely acute hemorrhage into sinus. Electronically Signed   By: Iven Finn M.D.   On: 11/20/2020 20:28   DG Chest Port 1 View  Result Date: 11/20/2020 CLINICAL DATA:  Questionable sepsis - evaluate for abnormality Abnormal breathing and confusion.  Fever. EXAM: PORTABLE CHEST 1 VIEW COMPARISON:  Radiograph 09/23/2020 FINDINGS: Lung volumes are low. Stable heart size and mediastinal contours, accentuated by low lung volumes. There is bronchovascular crowding. No focal consolidation. No pneumothorax or significant pleural effusion. No  acute osseous abnormalities are seen. IMPRESSION: Low lung volumes with bronchovascular crowding. No evidence of focal consolidation. Electronically Signed   By: Keith Rake M.D.   On: 11/20/2020 17:22     Sharion Settler, DO 11/21/2020, 4:12 AM PGY-1, [place] Intern pager: [phone], text pages welcome

## 2020-11-21 NOTE — Progress Notes (Addendum)
[place] [facility] regarding urine culture results however no answer. Will try again later.  Lattie Haw MD  [place]

## 2020-11-21 NOTE — Telephone Encounter (Signed)
Patients daughter LVM on nurse line requesting to speak to Dr. Erin Hearing. Will forward.

## 2020-11-21 NOTE — Progress Notes (Signed)
Spoke to Dr Louis Meckel from Urology regarding pt's bilateral hydronephrosis from CT abdomen pelvis today. He recommended placing foley catheter, getting repeat renal US in 48 hrs. To call urology if persistent hydronephrosis. Recommended keeping catheter in until one week post discharge. Urology will follow up 1 week post discharge for voiding trial.  Lattie Haw MD  [place]

## 2020-11-21 NOTE — ED Notes (Signed)
Verbal report given to [employer]

## 2020-11-21 NOTE — ED Notes (Signed)
Pt continues to scream for a bottle of water at this time

## 2020-11-21 NOTE — Progress Notes (Signed)
PHARMACY - PHYSICIAN COMMUNICATION CRITICAL VALUE ALERT - BLOOD CULTURE IDENTIFICATION (BCID)  Jacqueline Goodman is an 70 y.o. female who presented to [facility] on 11/20/2020 with a chief complaint of abnormal breathing and confusion. Thought to have a urinary source of sepsis with bilateral hydroureteronephrosis and bladder wall thickening likely reflecting cystitis.  Assessment:  1/4 anaerobic bottle positive for staph species. Likely contaminant   Name of physician (or Provider) Contacted: Dr. Nita Sells   Current antibiotics: cefepime, vancomycin, flagyl  Changes to prescribed antibiotics recommended:  Recommended continue cefepime flagyl, stop vancomycin  Waiting response from provider  No results found for this or any previous visit.  [name] 11/21/2020  1:13 PM

## 2020-11-21 NOTE — ED Notes (Signed)
Pt given orange juice then crackers. Pt given crackers and peanut butter.

## 2020-11-21 NOTE — ED Notes (Signed)
Provider at bedside at this time

## 2020-11-21 NOTE — ED Notes (Signed)
Lab at bedside at this time.  

## 2020-11-22 ENCOUNTER — Encounter (HOSPITAL_COMMUNITY): Payer: Self-pay | Admitting: Family Medicine

## 2020-11-22 ENCOUNTER — Other Ambulatory Visit: Payer: Self-pay

## 2020-11-22 ENCOUNTER — Ambulatory Visit: Payer: Medicare Other | Admitting: Family Medicine

## 2020-11-22 ENCOUNTER — Inpatient Hospital Stay (HOSPITAL_COMMUNITY): Payer: Medicare Other

## 2020-11-22 DIAGNOSIS — R6520 Severe sepsis without septic shock: Secondary | ICD-10-CM | POA: Diagnosis not present

## 2020-11-22 DIAGNOSIS — A419 Sepsis, unspecified organism: Secondary | ICD-10-CM | POA: Diagnosis not present

## 2020-11-22 DIAGNOSIS — N289 Disorder of kidney and ureter, unspecified: Secondary | ICD-10-CM | POA: Diagnosis not present

## 2020-11-22 DIAGNOSIS — L8990 Pressure ulcer of unspecified site, unspecified stage: Secondary | ICD-10-CM | POA: Insufficient documentation

## 2020-11-22 DIAGNOSIS — R4182 Altered mental status, unspecified: Secondary | ICD-10-CM | POA: Diagnosis not present

## 2020-11-22 LAB — BASIC METABOLIC PANEL
Anion gap: 7 (ref 5–15)
BUN: 75 mg/dL — ABNORMAL HIGH (ref 8–23)
CO2: 21 mmol/L — ABNORMAL LOW (ref 22–32)
Calcium: 8.4 mg/dL — ABNORMAL LOW (ref 8.9–10.3)
Chloride: 110 mmol/L (ref 98–111)
Creatinine, Ser: 1.02 mg/dL — ABNORMAL HIGH (ref 0.44–1.00)
GFR, Estimated: 60 mL/min — ABNORMAL LOW (ref >60)
Glucose, Bld: 84 mg/dL (ref 70–99)
Potassium: 3.6 mmol/L (ref 3.5–5.1)
Sodium: 138 mmol/L (ref 135–145)

## 2020-11-22 LAB — CBC
HCT: 33.9 % — ABNORMAL LOW (ref 36.0–46.0)
Hemoglobin: 11.6 g/dL — ABNORMAL LOW (ref 12.0–15.0)
MCH: 29.8 pg (ref 26.0–34.0)
MCHC: 34.2 g/dL (ref 30.0–36.0)
MCV: 87.1 fL (ref 80.0–100.0)
Platelets: 311 10*3/uL (ref 150–400)
RBC: 3.89 MIL/uL (ref 3.87–5.11)
RDW: 13.6 % (ref 11.5–15.5)
WBC: 9.7 10*3/uL (ref 4.0–10.5)
nRBC: 0 % (ref 0.0–0.2)

## 2020-11-22 LAB — GLUCOSE, CAPILLARY
Glucose-Capillary: 72 mg/dL (ref 70–99)
Glucose-Capillary: 65 mg/dL — ABNORMAL LOW (ref 70–99)
Glucose-Capillary: 92 mg/dL (ref 70–99)
Glucose-Capillary: 109 mg/dL — ABNORMAL HIGH (ref 70–99)
Glucose-Capillary: 153 mg/dL — ABNORMAL HIGH (ref 70–99)
Glucose-Capillary: 92 mg/dL (ref 70–99)
Glucose-Capillary: 77 mg/dL (ref 70–99)

## 2020-11-22 MED ORDER — SENNA 8.6 MG PO TABS
1.0000 | ORAL_TABLET | Freq: Every day | ORAL | Status: DC
  Administered 2020-11-22 – 2020-11-28 (×6): 8.6 mg via ORAL
  Filled 2020-11-22 (×6): qty 1

## 2020-11-22 MED ORDER — SODIUM CHLORIDE 0.9 % IV SOLN
2.0000 g | Freq: Three times a day (TID) | INTRAVENOUS | Status: DC
  Administered 2020-11-22: 2 g via INTRAVENOUS
  Filled 2020-11-22 (×2): qty 2

## 2020-11-22 MED ORDER — SODIUM CHLORIDE 0.9 % IV SOLN
1.0000 g | INTRAVENOUS | Status: DC
  Administered 2020-11-22 – 2020-11-23 (×2): 1 g via INTRAVENOUS
  Filled 2020-11-22 (×2): qty 10
  Filled 2020-11-22: qty 1

## 2020-11-22 MED ORDER — POLYETHYLENE GLYCOL 3350 17 G PO PACK
17.0000 g | PACK | Freq: Two times a day (BID) | ORAL | Status: DC
  Administered 2020-11-22 – 2020-11-24 (×4): 17 g via ORAL
  Filled 2020-11-22 (×5): qty 1

## 2020-11-22 NOTE — Plan of Care (Signed)
  Problem: Education: Goal: Knowledge of General Education information will improve Description: Including pain rating scale, medication(s)/side effects and non-pharmacologic comfort measures Outcome: Not Progressing   Problem: Health Behavior/Discharge Planning: Goal: Ability to manage health-related needs will improve Outcome: Not Progressing   Problem: Clinical Measurements: Goal: Ability to maintain clinical measurements within normal limits will improve Outcome: Not Progressing Goal: Will remain free from infection Outcome: Not Progressing Goal: Diagnostic test results will improve Outcome: Not Progressing Goal: Respiratory complications will improve Outcome: Not Progressing Goal: Cardiovascular complication will be avoided Outcome: Not Progressing   Problem: Fluid Volume: Goal: Compliance with measures to maintain balanced fluid volume will improve Outcome: Not Progressing   Problem: Health Behavior/Discharge Planning: Goal: Ability to manage health-related needs will improve Outcome: Not Progressing

## 2020-11-22 NOTE — Progress Notes (Signed)
SLP Cancellation Note  Patient Details Name: SHERRYANN FRESE MRN: [id number] DOB: 01-27-1951   Cancelled treatment:       Reason Eval/Treat Not Completed: Other (comment). MD reordered swallow eval, pt had an SLP clinical eval yesterday and was WNL. reproted to MD given new order. Will sign off.    Aodhan Scheidt, Katherene Ponto 11/22/2020, 8:40 AM

## 2020-11-22 NOTE — Evaluation (Signed)
Clinical/Bedside Swallow Evaluation Patient Details  Name: Jacqueline Goodman MRN: [id number] Date of Birth: Nov 11, 1950  Today's Date: 11/22/2020 Time: SLP Start Time (ACUTE ONLY): 44 SLP Stop Time (ACUTE ONLY): 1310 SLP Time Calculation (min) (ACUTE ONLY): 12 min  Past Medical History:  Past Medical History:  Diagnosis Date  . Diabetes mellitus without complication ([place])   . Hemorrhoids   . Hypertension   . Pneumonia    as a child  . PONV (postoperative nausea and vomiting)   . Wound infection after surgery 08/2017   Past Surgical History:  Past Surgical History:  Procedure Laterality Date  . ABDOMINAL HYSTERECTOMY     PARTIAL  . ABDOMINOPLASTY  07/2017   tummy tuck  . CHOLECYSTECTOMY    . COLONOSCOPY    . DEBRIDEMENT AND CLOSURE WOUND N/A 09/02/2017   Procedure: WOUND DEBRIDEMENT;  Surgeon: Youlanda Roys, MD;  Location: [place];  Service: Plastics;  Laterality: N/A;  . INCISION AND DRAINAGE OF WOUND N/A 08/15/2017   Procedure: Ananias Pilgrim AND DEBRIDEMENT OF ABDOMINAL WOUND;  Surgeon: Youlanda Roys, MD;  Location: [place];  Service: Plastics;  Laterality: N/A;  SPINAL   HPI:  70 y.o. female  who presented with abnormal breathing and confusion, admitted for acute toxic metabolic encephalopathy. PMH is significant for dementia, T2DM, HTN, HLD, depression. Found to have UTI and sepsis. Reconsulted for swallowing as pt with overt coughing with POs per RN.   Assessment / Plan / Recommendation Clinical Impression  SLP reconsulted due to overt coughing per RN with POs this date. Pt alert, though with dementia and frequent yelling out. Xerostomia and lingual coating noted, RN to coordinate with MD for possible intervention for thrush. Pt agreeable to brief oral care but proceeded to refuse further attempts despite education. Trials of thins, puree, and solid PO were without overt difficulty. Due to episodic difficutly noted with staff, SLP to follow up for PO diet analysis.  Continue regular thin liquid diet.   SLP Visit Diagnosis: Dysphagia, unspecified (R13.10)    Aspiration Risk  Mild aspiration risk    Diet Recommendation   Regular, thin liquids   Medication Administration: Whole meds with puree    Other  Recommendations Oral Care Recommendations: Oral care BID   Follow up Recommendations Skilled Nursing facility      Frequency and Duration min 1 x/week  1 week       Prognosis Prognosis for Safe Diet Advancement: Good Barriers to Reach Goals: Cognitive deficits      Swallow Study   General Date of Onset: 11/20/20 HPI: 70 y.o. female  who presented with abnormal breathing and confusion, admitted for acute toxic metabolic encephalopathy. PMH is significant for dementia, T2DM, HTN, HLD, depression. Found to have UTI and sepsis. Reconsulted for swallowing as pt with overt coughing with POs per RN. Type of Study: Bedside Swallow Evaluation Previous Swallow Assessment: BSE unremarkable 11/21/20 Diet Prior to this Study: Regular;Thin liquids Temperature Spikes Noted: No Respiratory Status: Room air History of Recent Intubation: No Behavior/Cognition: Alert Oral Cavity Assessment: Dry Oral Care Completed by SLP: Yes Oral Cavity - Dentition: Adequate natural dentition Vision: Functional for self-feeding Self-Feeding Abilities: Needs assist Patient Positioning: Upright in bed Baseline Vocal Quality: Normal Volitional Cough: Cognitively unable to elicit Volitional Swallow: Able to elicit    Oral/Motor/Sensory Function Overall Oral Motor/Sensory Function: Within functional limits   Ice Chips Ice chips: Not tested (pt declined)   Thin Liquid Thin Liquid: Within functional limits Presentation: Cup;Straw  Nectar Thick Nectar Thick Liquid: Not tested   Honey Thick Honey Thick Liquid: Not tested   Puree Puree: Within functional limits Presentation: Spoon   Solid     Solid: Within functional limits Presentation: [place], CCC-SLP Acute Rehabilitation Services   11/22/2020,1:28 PM

## 2020-11-22 NOTE — Consult Note (Signed)
   [facility] Inpatient Consult   11/22/2020  Allysson Rinehimer Haran 1951/03/05 [id number]   [facility] [ACO] Patient:  Medicare DCE  Primary Care Provider: Lind Covert, MD, [facility], Embedded   Patient was screened for [employer] services. Patient will have the transition of care call conducted by the primary care provider. She recently transitioned from a SNF [[facility]] to  home with DME and has 8 hours/7days home aide per notes.  Plan: Will foillow for progress and disposition needs with inpatient [facility] team.  Please contact for further questions,  Natividad Brood, RN BSN [facility] Liaison  [phone] business mobile phone Toll free office [phone]  Fax number: [phone] [email or web address]

## 2020-11-22 NOTE — Care Management Important Message (Signed)
Important Message  Patient Details  Name: Jacqueline Goodman MRN: [id number] Date of Birth: 02/04/1951   Medicare Important Message Given:  Yes     Orbie Pyo 11/22/2020, 2:29 PM

## 2020-11-22 NOTE — Progress Notes (Signed)
Family Medicine Teaching Service Daily Progress Note Intern Pager: [phone]  Patient name: Jacqueline Goodman Medical record number: [id number] Date of birth: 1951-07-15 Age: 70 y.o. Gender: female  Primary Care Provider: Lind Covert, MD Consultants: Urology Code Status: FULL   Pt Overview and Major Events to Date:  3/27: Code sepsis activated in ED; admitted 3/28: B/l hydronephrosis; foley placed  Assessment and Plan: Jacqueline Goodman a 70 y.o.female who presented with abnormal breathing and confusion, admitted for acute toxic metabolic encephalopathy. PMH is significant fordementia, T2DM, HTN, HLD, depression.  Urosepsis  B/l Hydroureteronephrosis Seen on CT imaging yesterday. Foley placed for bladder decompression. UOP 2,185mL/hr. WBC now normal, 13.6>9.7. Vitals stable. Able to speak with [name] place regarding patients recent antibiotic treatment: stated she received macrobid x3 (2/25-3/4, 3/14-3/21 and 3/22-3/25). UCx grew pan-sensitive E. Coli followed by Proteus resistant to cipro, levquin and bactrim. Given urinary source of infection, flagyl and vanc were d/c. Will transition to CTX -D/c cefepime (3/27-3/29) -Switch to CTX 1 gram daily -Tylenol 650 mg PRN fever, mild pain -Repeat renal U/S tomorrow  Post-renal AKI: Resolved Creatinine 1.88>1.02, GFR 29>60. Back to baseline. Foley placed with over 2L of UOP since yesterday. On IVF at 150 mL/hr, given return to baseline can decrease fluids.  -Holding Losartan  -Decrease fluids to half, rate 75 mL/hr  Bilateral Heel Ulcers (L>R)  Sacral pressure ulcer Wound care consulted yesterday, recommendations left on wound care.  -Monitor wound daily -Wound care per consult note  Type 2 diabetes CBG's ranging 65-142. Did not receive any sliding scale coverage yesterday. 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 There was evidence of chronic atrophy and moderate white matter disease reflecting the sequela of chronic microvascular ischemia. Vascular dementia would explain this patients overall stepwise decline and prominent impairment in executive function.  -Delirium precautions  Hyperlipidemia: Chronic, stable -Continue home atorvastatin 40 mg daily  Hypertension: Chronic, stable BPs ranging 104-143/38-76. Most recent 112/62.. -Holding home losartan   GERD: Chronic, stable -Continue home famotidine as needed for reflux  Depression chronic, stable -Continue home citalopram  Moderate protein calorie malnutrition: Chronic. -Ensure shakes between meals if passes formal swallow study  History of COVID-19 illness (08/2020) Chronic residual fatigue.   FEN/GI: Regular diet PPx: Lovenox   Status is: Inpatient  Remains inpatient appropriate because:Inpatient level of care appropriate due to severity of illness   Dispo:  Patient From: Home  Planned Disposition: To be determined  Medically stable for discharge: No      Subjective:  Patient feels better this morning. States that her abdominal pain is improved. States that she is feeling cold.   Objective: Temp:  [98.2 F (36.8 C)-98.3 F (36.8 C)] 98.2 F (36.8 C) (03/28 2131) Pulse Rate:  [69-87] 80 (03/28 2131) Resp:  [15-22] 20 (03/28 2131) BP: (104-143)/(38-76) 112/62 (03/28 2131) SpO2:  [93 %-99 %] 97 % (03/28 2131) Weight:  [112.9 kg] 112.9 kg (03/28 1300) Physical Exam: General: Awake, alert and oriented to person but not place, tremulous  Cardiovascular: RRR without murmur Respiratory: CTAB  Abdomen: tenderness to mild palpation of all quadrants, mildly distended without rebound/guarding  Laboratory: Recent Labs  Lab 11/20/20 1700 11/20/20 2119 11/21/20 0210 11/22/20 0140  WBC 11.4*  --  13.6* 9.7  HGB 14.2 11.2* 12.0 11.6*  HCT 42.8 33.0* 36.0 33.9*  PLT 398  --  331 311   Recent Labs  Lab  11/20/20 1700 11/20/20 2119 11/21/20 0210 11/22/20 0140  NA 133* 136 135 138  K 5.3* 4.4 4.1 3.6  CL 104  --  108 110  CO2 12*  --  18* 21*  BUN 102*  --  98* 75*  CREATININE 2.44*  --  1.88* 1.02*  CALCIUM 8.7*  --  8.6* 8.4*  PROT 7.1  --   --   --   BILITOT 1.1  --   --   --   ALKPHOS 113  --   --   --   ALT 32  --   --   --   AST 35  --   --   --   GLUCOSE 342*  --  260* 84     Imaging/Diagnostic Tests: CT ABDOMEN PELVIS WO CONTRAST  Result Date: 11/21/2020 CLINICAL DATA:  Severe sepsis.  Recent UTI.  Abdominal pain EXAM: CT ABDOMEN AND PELVIS WITHOUT CONTRAST TECHNIQUE: Multidetector CT imaging of the abdomen and pelvis was performed following the standard protocol without IV contrast. COMPARISON:  09/05/2017 FINDINGS: Lower chest:  Mitral valve calcification.  No acute finding. Hepatobiliary: Limited liver assessment due to streak artifact from patient arms and noncontrast technique.Cholecystectomy. Pancreas: Unremarkable. Spleen: Unremarkable. Adrenals/Urinary Tract: Negative adrenals. Bilateral hydroureteronephrosis to the level of a dilated urinary bladder. The bladder is thick walled bilaterally which could be from inflammation or chronic outlet obstruction. Stomach/Bowel: No bowel obstruction or visible inflammation, including of the appendix. Stool distends the rectum to 8 cm diameter. Vascular/Lymphatic: Atheromatous calcification. No mass or adenopathy. Reproductive:Hysterectomy. Other: No ascites or pneumoperitoneum. Ventral abdominal wall scarring with muscular thinning. Musculoskeletal: No acute abnormalities. Ordinary spinal degeneration IMPRESSION: 1. Bilateral hydroureteronephrosis above a distended urinary bladder. Bladder wall thickening could reflect superimposed cystitis. 2. Rectal stool distention to 8 cm. Electronically Signed   By: Monte Fantasia M.D.   On: 11/21/2020 10:34   MR BRAIN WO CONTRAST  Result Date: 11/21/2020 CLINICAL DATA:  Abnormal breathing and  confusion. Acute toxic metabolic encephalopathy. EXAM: MRI HEAD WITHOUT CONTRAST TECHNIQUE: Multiplanar, multiecho pulse sequences of the brain and surrounding structures were obtained without intravenous contrast. The patient only tolerated axial and coronal diffusion weighted images and axial FLAIR and SWI images. COMPARISON:  CT head without contrast 11/20/2020 FINDINGS: Brain: The diffusion-weighted images demonstrate no acute or subacute infarction. Chronic atrophy and moderate white matter changes are present bilaterally. The ventricles are proportionate to the degree of atrophy. No significant extraaxial fluid collection is present. The internal auditory canals are within normal limits. The brainstem and cerebellum are within normal limits. No acute hemorrhage or mass lesion is present. Vascular: Flow is present in the major intracranial arteries. Skull and upper cervical spine: The craniocervical junction is normal. Upper cervical spine is within normal limits. Marrow signal is unremarkable. Sinuses/Orbits: Right greater than left maxillary sinus opacification appears chronic. Anterior right ethmoid air cells opacified. The paranasal sinuses and mastoid air cells are otherwise clear. Bilateral lens replacements are noted. Globes and orbits are otherwise unremarkable. IMPRESSION: 1. No acute intracranial abnormality. 2. Chronic atrophy and moderate white matter disease likely reflects the sequela of chronic microvascular ischemia. 3. Chronic right greater than left maxillary and ethmoid sinus disease. Electronically Signed   By: San Morelle M.D.   On: 11/21/2020 14:36     Sharion Settler, DO 11/22/2020, 5:37 AM PGY-1, [place] Intern pager: [phone], text pages welcome

## 2020-11-23 ENCOUNTER — Inpatient Hospital Stay (HOSPITAL_COMMUNITY): Payer: Medicare Other

## 2020-11-23 DIAGNOSIS — R6520 Severe sepsis without septic shock: Secondary | ICD-10-CM | POA: Diagnosis not present

## 2020-11-23 DIAGNOSIS — R4182 Altered mental status, unspecified: Secondary | ICD-10-CM | POA: Diagnosis not present

## 2020-11-23 DIAGNOSIS — A419 Sepsis, unspecified organism: Secondary | ICD-10-CM | POA: Diagnosis not present

## 2020-11-23 DIAGNOSIS — N179 Acute kidney failure, unspecified: Secondary | ICD-10-CM | POA: Diagnosis not present

## 2020-11-23 LAB — BASIC METABOLIC PANEL
Anion gap: 6 (ref 5–15)
BUN: 42 mg/dL — ABNORMAL HIGH (ref 8–23)
CO2: 21 mmol/L — ABNORMAL LOW (ref 22–32)
Calcium: 8.2 mg/dL — ABNORMAL LOW (ref 8.9–10.3)
Chloride: 113 mmol/L — ABNORMAL HIGH (ref 98–111)
Creatinine, Ser: 0.68 mg/dL (ref 0.44–1.00)
GFR, Estimated: >60 mL/min (ref >60)
Glucose, Bld: 126 mg/dL — ABNORMAL HIGH (ref 70–99)
Potassium: 3.9 mmol/L (ref 3.5–5.1)
Sodium: 140 mmol/L (ref 135–145)

## 2020-11-23 LAB — GLUCOSE, CAPILLARY
Glucose-Capillary: 115 mg/dL — ABNORMAL HIGH (ref 70–99)
Glucose-Capillary: 118 mg/dL — ABNORMAL HIGH (ref 70–99)
Glucose-Capillary: 121 mg/dL — ABNORMAL HIGH (ref 70–99)
Glucose-Capillary: 123 mg/dL — ABNORMAL HIGH (ref 70–99)
Glucose-Capillary: 125 mg/dL — ABNORMAL HIGH (ref 70–99)
Glucose-Capillary: 133 mg/dL — ABNORMAL HIGH (ref 70–99)
Glucose-Capillary: 135 mg/dL — ABNORMAL HIGH (ref 70–99)
Glucose-Capillary: 152 mg/dL — ABNORMAL HIGH (ref 70–99)
Glucose-Capillary: 156 mg/dL — ABNORMAL HIGH (ref 70–99)

## 2020-11-23 LAB — CULTURE, BLOOD (ROUTINE X 2)

## 2020-11-23 LAB — URINE CULTURE: Culture: 50000 — AB

## 2020-11-23 LAB — AMMONIA: Ammonia: 33 umol/L (ref 9–35)

## 2020-11-23 MED ORDER — LIP MEDEX EX OINT
TOPICAL_OINTMENT | Freq: Every day | CUTANEOUS | Status: DC
  Filled 2020-11-23: qty 7

## 2020-11-23 NOTE — Progress Notes (Signed)
Spoke with Dr Alinda Money from Urology regarding persistent hydronephrosis. He said that it is reassuring that her kidney function is improving along with her delirium. He will see the pt today and recommended we keep the foley catheter in. She will have follow up as an outpatient with urology to assess the cause of the urinary retention. He also explained her hydronephrosis is likely chronic.  Lattie Haw MD  PGY-2, [place]

## 2020-11-23 NOTE — Plan of Care (Signed)
  Problem: Education: Goal: Knowledge of General Education information will improve Description: Including pain rating scale, medication(s)/side effects and non-pharmacologic comfort measures Outcome: Progressing   Problem: Health Behavior/Discharge Planning: Goal: Ability to manage health-related needs will improve Outcome: Progressing   Problem: Clinical Measurements: Goal: Ability to maintain clinical measurements within normal limits will improve Outcome: Progressing Goal: Will remain free from infection Outcome: Progressing Goal: Diagnostic test results will improve Outcome: Progressing Goal: Respiratory complications will improve Outcome: Progressing Goal: Cardiovascular complication will be avoided Outcome: Progressing   Problem: Activity: Goal: Risk for activity intolerance will decrease Outcome: Progressing   Problem: Nutrition: Goal: Adequate nutrition will be maintained Outcome: Progressing   Problem: Coping: Goal: Level of anxiety will decrease Outcome: Progressing   Problem: Elimination: Goal: Will not experience complications related to bowel motility Outcome: Progressing Goal: Will not experience complications related to urinary retention Outcome: Progressing   Problem: Pain Managment: Goal: General experience of comfort will improve Outcome: Progressing   Problem: Safety: Goal: Ability to remain free from injury will improve Outcome: Progressing   Problem: Skin Integrity: Goal: Risk for impaired skin integrity will decrease Outcome: Progressing   Problem: Fluid Volume: Goal: Compliance with measures to maintain balanced fluid volume will improve Outcome: Progressing   Problem: Health Behavior/Discharge Planning: Goal: Ability to manage health-related needs will improve Outcome: Progressing   Problem: Nutritional: Goal: Ability to make healthy dietary choices will improve Outcome: Progressing   Problem: Clinical Measurements: Goal:  Complications related to the disease process, condition or treatment will be avoided or minimized Outcome: Progressing

## 2020-11-23 NOTE — Progress Notes (Signed)
EEG complete - results pending 

## 2020-11-23 NOTE — Progress Notes (Signed)
PCP Visit Note and Code Status Change  Saw Ms Stumpe today.  She recognized me but was not oriented. Has clinically worsened greatly in last 6 months   Discussed with her daughter Glenard Haring, her primary care giver  Ms Elenbaas has said in past would not want to live if developed dementia and Glenard Haring feels she would not want resuscitation in her current state  Will change status to DNR Continue full other care and workup  Greatly appreciate the great care of our inpt team.

## 2020-11-23 NOTE — Progress Notes (Addendum)
Family Medicine Teaching Service Daily Progress Note Intern Pager: [phone]  Patient name: Jacqueline Goodman Medical record number: [id number] Date of birth: 22-Jun-1951 Age: 70 y.o. Gender: female  Primary Care Provider: Lind Covert, MD Consultants: Urology Code Status: FULL  Pt Overview and Major Events to Date:  3/27: Code sepsis activated in ED;admitted 3/28: B/l hydronephrosis; foley placed 3/29: UCx Proteus; switched from Cefepime to CTX  Assessment and Plan: Uldine P Justiceis a 70 y.o.femalewho is admitted for severe urosepsis with Proteus. PMH is significant fordementia, T2DM, HTN, HLD, depression.  Urosepsis  B/l Hydroureteronephrosis Repeat renal U/S still with moderate hydroneprhosis, reassuring that creatinine is much improved. Foley still in place for bladder decompression. 1,150cc UOP yesterday. UCx with 50,000 colonies of proteus mirabilis, has been transitioned to CTX. Will plan for 7-day course. -S/p cefepime (3/27-3/29) -Continue CTX 1 g daily (3/29-) -Urology consulted -Tylenol 650 mg PRN fever, mild pain -Strict I/O  Post-renal AKI: Resolved Creatinine 1.02>0.68, GFR >60. -Holding Losartan  -Keep foley in -D/c fluids  Bilateral Heel Ulcers (L>R)  Sacral pressure ulcer -Monitor wounds daily -Wound care per consult note -Collagenase daily to sacral wound  Type 2 diabetes CBG's ranging 77-152. Only received 3U sliding scale. 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 Vascular dementia would explain this patients overall stepwise decline and prominent impairment in executive function.  -Delirium precautions -F/u outpatient in geriatric clinic  Hyperlipidemia:Chronic, stable -Continue home atorvastatin 40 mg daily  Hypertension: Chronic, stable BPs ranging  111-134/55-71.Most recent 134/71. -Holding home losartan   Stool Burden Appreciated on CT. -MiraLAX 17g BID -Senna 1 tablet daily  GERD: Chronic, stable -Continue home famotidine daily  Depression chronic, stable -Continue home citalopram  Moderate protein calorie malnutrition: Chronic. -Glucerna shakes TID between meals  History of COVID-19 illness(08/2020) Chronic residual fatigue.   FEN/GI: Regular diet PPx: Lovenox   Status is: Inpatient  Remains inpatient appropriate because:IV treatments appropriate due to intensity of illness or inability to take PO   Dispo:  Patient From: Home  Planned Disposition: To be determined  Medically stable for discharge: No      Subjective:  Patient feels well this morning when asked. She does continue to feel cold. She denies abdominal pain or concerns. She does not know where she is but when given options she is able to correctly identify the building as a hospital. She states it is 1952 still. Patient did not want to be bothered much this morning, states "I just want to go back to sleep".   Objective: Temp:  [98 F (36.7 C)-98.7 F (37.1 C)] 98.2 F (36.8 C) (03/30 0233) Pulse Rate:  [71-83] 78 (03/30 0233) Resp:  [14-19] 17 (03/30 0233) BP: (111-134)/(55-71) 134/71 (03/30 0233) SpO2:  [96 %-99 %] 98 % (03/30 0233) Physical Exam: General: Awake, alert, oriented to person, place but not time or situation HEENT: Dry mucous membranes, tongue is dry but without evidence of thrush Cardiovascular: RRR without murmur Respiratory: Breathing comfortable on room air without accessory muscle use or distress, clear in anterior fields Abdomen: non-tender to palpation, shouts "stop" during palpation but denies pain, abdomen soft, non-distended, without rebound/guarding Extremities: 2+ DP pulses   Laboratory: Recent Labs  Lab 11/20/20 1700 11/20/20 2119 11/21/20 0210 11/22/20 0140  WBC 11.4*  --  13.6* 9.7  HGB 14.2 11.2*  12.0 11.6*  HCT 42.8 33.0*  36.0 33.9*  PLT 398  --  331 311   Recent Labs  Lab 11/20/20 1700 11/20/20 2119 11/21/20 0210 11/22/20 0140  NA 133* 136 135 138  K 5.3* 4.4 4.1 3.6  CL 104  --  108 110  CO2 12*  --  18* 21*  BUN 102*  --  98* 75*  CREATININE 2.44*  --  1.88* 1.02*  CALCIUM 8.7*  --  8.6* 8.4*  PROT 7.1  --   --   --   BILITOT 1.1  --   --   --   ALKPHOS 113  --   --   --   ALT 32  --   --   --   AST 35  --   --   --   GLUCOSE 342*  --  260* 84    Imaging/Diagnostic Tests: 3/29 Renal U/S IMPRESSION: Moderate bilateral hydronephrosis persists after bladder decompression by Foley catheter.  Sharion Settler, DO 11/23/2020, 5:31 AM PGY-1, [place] Intern pager: [phone], text pages welcome

## 2020-11-23 NOTE — Procedures (Signed)
EEG Report Indication: possible seizure activity  This study was recorded in the waiting/sleep state.  The duration of the study was 27 minutes.  Electrodes were placed according to the International 10/20 system.  Video was reviewed/available for clinical correlation as needed.  In the waking state, discernible but diminished background organization is seen with a significantly attenuated anterior - posterior voltage and frequency gradient.  In the occipital leads there was a symmetric and reactive although indistinct and poorly sustained posterior dominant rhythm of approximately 6 - 7 hertz, which is slower than expected for age  Anteriorly, is the expected pattern of faster frequency, lower voltage waveforms, although in a reduced amount as compared to normal.  During sleep, there were poorly formed but symmetric sleep transients.   Positive occipital sharp transients of sleep (POSTS), a benign/normal variant were seen.  Hyperventilation: deferred Photic stimulation: deferred  There are no clear focal, paroxysmal or epileptiform abnormalities  or interhemispheric asymmetries.  Impression:  This is an abnormal waiting and sleep study due to diminished background organization as described above, most clinically compatible with a mild diffuse encephalopathy.  There are no clear focal or epileptiform abnormalities.

## 2020-11-23 NOTE — Consult Note (Signed)
Urology Consult   Physician requesting consult: Dr. Yehuda Savannah  Reason for consult: Urinary retention, hydronephrosis  History of Present Illness: Jacqueline Goodman is a 70 y.o. who was admitted to [facility] on 11/20/20 and was noted to have an acute kidney injury with a Cr of 2.4, UTI, mental status changes with evidence of early sepsis.  CT imaging revealed bilateral hydroureteronephrosis with a very distended bladder and severe constipation.  A urethral catheter was placed with return of over 2 L of urine.  She has since improved clinically with resolution of her kidney injury (Cr now 0.68) and improvement of her infectious parameters.  Urine culture is positive for Proteus. She is on ceftriaxone.  [name] is not able to provide a reliable medical history due to her current mental status and confusion/delirium but denies voiding difficulties, incontinence, etc previously.  I did speak with her daughter, Jacqueline Goodman, who describes a few month history of her mother deteriorating (although she told me another of her mother's acquaintances has noted a gradual decline over the past few years).  She was ambulatory and relatively independent 6 months ago but showed difficulties with memory and independent functioning in the late fall.  She has basically been non-ambulatory since January.  Her daughter denies any known urologic issues previously.  She denies her mother having recent incontinence except for very mild stress incontinence.  She denies a history of urinary tract infections until this winter when she has been in nursing facilities.  She had a pan-sensitive E coli UTI earlier in the winter.    Prior CT imaging from a few years ago indicated an unremarkable urinary tract.  She denies a history of voiding or storage urinary symptoms, hematuria, UTIs, STDs, urolithiasis, GU malignancy/trauma/surgery.  Past Medical History:  Diagnosis Date  . Diabetes mellitus without complication ([place])   .  Hemorrhoids   . Hypertension   . Pneumonia    as a child  . PONV (postoperative nausea and vomiting)   . Wound infection after surgery 08/2017    Past Surgical History:  Procedure Laterality Date  . ABDOMINAL HYSTERECTOMY     PARTIAL  . ABDOMINOPLASTY  07/2017   tummy tuck  . CHOLECYSTECTOMY    . COLONOSCOPY    . DEBRIDEMENT AND CLOSURE WOUND N/A 09/02/2017   Procedure: WOUND DEBRIDEMENT;  Surgeon: Youlanda Roys, MD;  Location: [place];  Service: Plastics;  Laterality: N/A;  . INCISION AND DRAINAGE OF WOUND N/A 08/15/2017   Procedure: Ananias Pilgrim AND DEBRIDEMENT OF ABDOMINAL WOUND;  Surgeon: Youlanda Roys, MD;  Location: [place];  Service: Plastics;  Laterality: N/A;  SPINAL     Current Hospital Medications:  Home meds:  No current facility-administered medications on file prior to encounter.   Current Outpatient Medications on File Prior to Encounter  Medication Sig Dispense Refill  . acetaminophen (TYLENOL) 325 MG tablet Take 2 tablets (650 mg total) by mouth every 6 (six) hours as needed for mild pain or headache (fever >/= 101). 30 tablet 0  . amoxicillin-clavulanate (AUGMENTIN) 875-125 MG tablet Take 1 tablet by mouth 2 (two) times daily.    Marland Kitchen atorvastatin (LIPITOR) 40 MG tablet TAKE 1 TABLET BY MOUTH EVERY DAY (Patient taking differently: Take 40 mg by mouth daily.) 90 tablet 3  . BESIVANCE 0.6 % SUSP Apply 1 drop to eye 3 (three) times daily.    . citalopram (CELEXA) 20 MG tablet Take 20 mg by mouth daily.    . diphenhydrAMINE (BENADRYL) 25  mg capsule Take 25 mg by mouth at bedtime as needed for sleep.    . famotidine (PEPCID) 20 MG tablet Take 1 tablet (20 mg total) by mouth at bedtime. (Patient taking differently: Take 20 mg by mouth daily as needed for heartburn or indigestion.) 30 tablet 1  . feeding supplement, GLUCERNA SHAKE, (GLUCERNA SHAKE) LIQD Take 237 mLs by mouth 3 (three) times daily between meals. Sugar Free    . glimepiride (AMARYL) 2 MG tablet  Take 2 mg by mouth daily with breakfast.    . haloperidol (HALDOL) 0.5 MG tablet Take 0.5 mg by mouth 2 (two) times daily.    . haloperidol (HALDOL) 1 MG tablet Take 1 mg by mouth at bedtime.    Marland Kitchen LEVEMIR FLEXTOUCH 100 UNIT/ML FlexPen Inject 25 Units into the skin daily.    Marland Kitchen losartan (COZAAR) 25 MG tablet TAKE 1 TABLET BY MOUTH EVERY DAY (Patient taking differently: Take 25 mg by mouth daily.) 90 tablet 0  . melatonin 3 MG TABS tablet Take 1 tablet (3 mg total) by mouth at bedtime. (Patient taking differently: Take 6 mg by mouth at bedtime.) 30 tablet 1  . mirtazapine (REMERON) 7.5 MG tablet Take 7.5 mg by mouth at bedtime.    . Multiple Vitamin (MULTIVITAMIN WITH MINERALS) TABS tablet Take 1 tablet by mouth daily. 30 tablet 3  . polyethylene glycol (MIRALAX / GLYCOLAX) 17 g packet Take 17 g by mouth daily as needed for mild constipation. 14 each 0  . vitamin C (ASCORBIC ACID) 500 MG tablet Take 500 mg by mouth 2 (two) times daily.    . Insulin Pen Needle (BD PEN NEEDLE NANO 2ND GEN) 32G X 4 MM MISC Use as directed once daily.  May substitute for needle that fits Basaglar Kwikpen 100 each 2  . Insulin Syringe-Needle U-100 (B-D INS SYR ULTRAFINE .3CC/31G) 31G X 5/16" 0.3 ML MISC Use as directed 100 each 1  . ONETOUCH ULTRA test strip USE TO TEST BLOOD SUGAR ONCE EVERY MORNING BEFORE EATING. ICD-10 CODE: E11.9 100 strip 3  . Semaglutide,0.25 or 0.5MG /DOS, (OZEMPIC, 0.25 OR 0.5 MG/DOSE,) 2 MG/1.5ML SOPN Inject 0.5 mg into the skin once a week. (Patient taking differently: Inject 0.25 mg into the skin once a week. Monday) 1.5 mL 3     Scheduled Meds: . atorvastatin  40 mg Oral QPC supper  . citalopram  20 mg Oral Daily  . collagenase   Topical Daily  . enoxaparin (LOVENOX) injection  40 mg Subcutaneous Q24H  . famotidine  20 mg Oral QPC supper  . feeding supplement (GLUCERNA SHAKE)  237 mL Oral TID BM  . insulin aspart  0-15 Units Subcutaneous TID WC  . lip balm   Topical Daily  . melatonin  3  mg Oral QHS  . polyethylene glycol  17 g Oral BID  . senna  1 tablet Oral Daily   Continuous Infusions: . cefTRIAXone (ROCEPHIN)  IV 1 g (11/23/20 1657)   PRN Meds:.acetaminophen **OR** acetaminophen  Allergies:  Allergies  Allergen Reactions  . Morphine And Related Other (See Comments)    "says doesn't work"  . Tape Rash    Breaks the skin down    Family History  Problem Relation Age of Onset  . Cancer Mother   . Breast cancer Mother   . Heart disease Father   . Alcohol abuse Father   . Diabetes Father   . Alcohol abuse Sister   . Heart disease Sister  Social History:  reports that she has never smoked. She has never used smokeless tobacco. She reports that she does not drink alcohol and does not use drugs.  ROS: A complete review of systems was performed.  All systems are negative except for pertinent findings as noted.  Physical Exam:  Vital signs in last 24 hours: Temp:  [98 F (36.7 C)-98.7 F (37.1 C)] 98 F (36.7 C) (03/30 1641) Pulse Rate:  [77-82] 80 (03/30 1641) Resp:  [17-21] 21 (03/30 1641) BP: (111-150)/(55-75) 150/68 (03/30 1641) SpO2:  [96 %-100 %] 100 % (03/30 1641) Constitutional:  Her affect is very blunted.  She is comfortable in bed but does not move voluntarily. Cardiovascular:  No JVD Respiratory: Normal respiratory effort GI: Abdomen is soft, nontender, nondistended, no abdominal masses GU: No CVA tenderness, Foley in place with grossly clear urine. Lymphatic: No lymphadenopathy Neurologic: Grossly intact, no focal deficits but difficult to assess as she will not follow commands and becomes quite agitated. Psychiatric: Agitated, not oriented to place or time  Laboratory Data:  Recent Labs    11/20/20 2119 11/21/20 0210 11/22/20 0140  WBC  --  13.6* 9.7  HGB 11.2* 12.0 11.6*  HCT 33.0* 36.0 33.9*  PLT  --  331 311    Recent Labs    11/20/20 2119 11/21/20 0210 11/22/20 0140 11/23/20 0647  NA 136 135 138 140  K 4.4 4.1 3.6  3.9  CL  --  108 110 113*  GLUCOSE  --  260* 84 126*  BUN  --  98* 75* 42*  CALCIUM  --  8.6* 8.4* 8.2*  CREATININE  --  1.88* 1.02* 0.68     Results for orders placed or performed during the hospital encounter of 11/20/20 (from the past 24 hour(s))  Glucose, capillary     Status: None   Collection Time: 11/22/20  7:36 PM  Result Value Ref Range   Glucose-Capillary 92 70 - 99 mg/dL  Glucose, capillary     Status: None   Collection Time: 11/22/20 10:16 PM  Result Value Ref Range   Glucose-Capillary 77 70 - 99 mg/dL  Glucose, capillary     Status: Abnormal   Collection Time: 11/23/20 12:38 AM  Result Value Ref Range   Glucose-Capillary 152 (H) 70 - 99 mg/dL  Glucose, capillary     Status: Abnormal   Collection Time: 11/23/20  2:30 AM  Result Value Ref Range   Glucose-Capillary 156 (H) 70 - 99 mg/dL  Glucose, capillary     Status: Abnormal   Collection Time: 11/23/20  4:51 AM  Result Value Ref Range   Glucose-Capillary 133 (H) 70 - 99 mg/dL  Basic metabolic panel     Status: Abnormal   Collection Time: 11/23/20  6:47 AM  Result Value Ref Range   Sodium 140 135 - 145 mmol/L   Potassium 3.9 3.5 - 5.1 mmol/L   Chloride 113 (H) 98 - 111 mmol/L   CO2 21 (L) 22 - 32 mmol/L   Glucose, Bld 126 (H) 70 - 99 mg/dL   BUN 42 (H) 8 - 23 mg/dL   Creatinine, Ser 0.68 0.44 - 1.00 mg/dL   Calcium 8.2 (L) 8.9 - 10.3 mg/dL   GFR, Estimated >60 >60 mL/min   Anion gap 6 5 - 15  Glucose, capillary     Status: Abnormal   Collection Time: 11/23/20  7:04 AM  Result Value Ref Range   Glucose-Capillary 121 (H) 70 - 99 mg/dL  Glucose, capillary  Status: Abnormal   Collection Time: 11/23/20  7:56 AM  Result Value Ref Range   Glucose-Capillary 125 (H) 70 - 99 mg/dL  Ammonia     Status: None   Collection Time: 11/23/20 11:21 AM  Result Value Ref Range   Ammonia 33 9 - 35 umol/L  Glucose, capillary     Status: Abnormal   Collection Time: 11/23/20 11:50 AM  Result Value Ref Range    Glucose-Capillary 135 (H) 70 - 99 mg/dL  Glucose, capillary     Status: Abnormal   Collection Time: 11/23/20  4:53 PM  Result Value Ref Range   Glucose-Capillary 118 (H) 70 - 99 mg/dL   Recent Results (from the past 240 hour(s))  Blood Culture (routine x 2)     Status: Abnormal   Collection Time: 11/20/20  5:00 PM   Specimen: BLOOD LEFT WRIST  Result Value Ref Range Status   Specimen Description BLOOD LEFT WRIST  Final   Special Requests   Final    BOTTLES DRAWN AEROBIC AND ANAEROBIC Blood Culture results may not be optimal due to an excessive volume of blood received in culture bottles   Culture  Setup Time   Final    GRAM POSITIVE COCCI IN CLUSTERS IN BOTH AEROBIC AND ANAEROBIC BOTTLES Organism ID to follow CRITICAL RESULT CALLED TO, READ BACK BY AND VERIFIED WITH: PHARMD T. BAUMEYER 11/21/2020 AT 7591 BY CM    Culture (A)  Final    STAPHYLOCOCCUS HOMINIS THE SIGNIFICANCE OF ISOLATING THIS ORGANISM FROM A SINGLE SET OF BLOOD CULTURES WHEN MULTIPLE SETS ARE DRAWN IS UNCERTAIN. PLEASE NOTIFY THE MICROBIOLOGY DEPARTMENT WITHIN ONE WEEK IF SPECIATION AND SENSITIVITIES ARE REQUIRED. Performed at [facility], [place] [address] [place], [place] [address]    Report Status 11/23/2020 FINAL  Final  Blood Culture ID Panel (Reflexed)     Status: Abnormal   Collection Time: 11/20/20  5:00 PM  Result Value Ref Range Status   Enterococcus faecalis NOT DETECTED NOT DETECTED Final   Enterococcus Faecium NOT DETECTED NOT DETECTED Final   Listeria monocytogenes NOT DETECTED NOT DETECTED Final   Staphylococcus species DETECTED (A) NOT DETECTED Final    Comment: CRITICAL RESULT CALLED TO, READ BACK BY AND VERIFIED WITH: PHARMD T. BAUMEYER 11/21/2020 AT 1314 BY CM    Staphylococcus aureus (BCID) NOT DETECTED NOT DETECTED Final   Staphylococcus epidermidis NOT DETECTED NOT DETECTED Final   Staphylococcus lugdunensis NOT DETECTED NOT DETECTED Final   Streptococcus species NOT DETECTED NOT  DETECTED Final   Streptococcus agalactiae NOT DETECTED NOT DETECTED Final   Streptococcus pneumoniae NOT DETECTED NOT DETECTED Final   Streptococcus pyogenes NOT DETECTED NOT DETECTED Final   A.calcoaceticus-baumannii NOT DETECTED NOT DETECTED Final   Bacteroides fragilis NOT DETECTED NOT DETECTED Final   Enterobacterales NOT DETECTED NOT DETECTED Final   Enterobacter cloacae complex NOT DETECTED NOT DETECTED Final   Escherichia coli NOT DETECTED NOT DETECTED Final   Klebsiella aerogenes NOT DETECTED NOT DETECTED Final   Klebsiella oxytoca NOT DETECTED NOT DETECTED Final   Klebsiella pneumoniae NOT DETECTED NOT DETECTED Final   Proteus species NOT DETECTED NOT DETECTED Final   Salmonella species NOT DETECTED NOT DETECTED Final   Serratia marcescens NOT DETECTED NOT DETECTED Final   Haemophilus influenzae NOT DETECTED NOT DETECTED Final   Neisseria meningitidis NOT DETECTED NOT DETECTED Final   Pseudomonas aeruginosa NOT DETECTED NOT DETECTED Final   Stenotrophomonas maltophilia NOT DETECTED NOT DETECTED Final   Candida albicans NOT DETECTED  NOT DETECTED Final   Candida auris NOT DETECTED NOT DETECTED Final   Candida glabrata NOT DETECTED NOT DETECTED Final   Candida krusei NOT DETECTED NOT DETECTED Final   Candida parapsilosis NOT DETECTED NOT DETECTED Final   Candida tropicalis NOT DETECTED NOT DETECTED Final   Cryptococcus neoformans/gattii NOT DETECTED NOT DETECTED Final    Comment: Performed at [facility], [place] [address], [place], [place] [address]  Blood Culture (routine x 2)     Status: None (Preliminary result)   Collection Time: 11/20/20  5:26 PM   Specimen: BLOOD LEFT WRIST  Result Value Ref Range Status   Specimen Description BLOOD LEFT WRIST  Final   Special Requests   Final    BOTTLES DRAWN AEROBIC AND ANAEROBIC Blood Culture results may not be optimal due to an inadequate volume of blood received in culture bottles   Culture   Final    NO GROWTH 3  DAYS Performed at [facility], [place] [address], [place], [place] [address]    Report Status PENDING  Incomplete  Urine culture     Status: Abnormal   Collection Time: 11/20/20 10:48 PM   Specimen: In/Out Cath Urine  Result Value Ref Range Status   Specimen Description IN/OUT CATH URINE  Final   Special Requests   Final    NONE Performed at [facility], [place] [address], [place], [place] [address]    Culture 50,000 COLONIES/mL PROTEUS MIRABILIS (A)  Final   Report Status 11/23/2020 FINAL  Final   Organism ID, Bacteria PROTEUS MIRABILIS (A)  Final      Susceptibility   Proteus mirabilis - MIC*    AMPICILLIN 8 SENSITIVE Sensitive     CEFAZOLIN 8 SENSITIVE Sensitive     CEFEPIME <=0.12 SENSITIVE Sensitive     CEFTRIAXONE <=0.25 SENSITIVE Sensitive     CIPROFLOXACIN >=4 RESISTANT Resistant     GENTAMICIN <=1 SENSITIVE Sensitive     IMIPENEM 2 SENSITIVE Sensitive     NITROFURANTOIN 128 RESISTANT Resistant     TRIMETH/SULFA >=320 RESISTANT Resistant     AMPICILLIN/SULBACTAM 4 SENSITIVE Sensitive     PIP/TAZO <=4 SENSITIVE Sensitive     * 50,000 COLONIES/mL PROTEUS MIRABILIS  Resp Panel by RT-PCR (Flu A&B, Covid) Nasopharyngeal Swab     Status: None   Collection Time: 11/20/20 11:59 PM   Specimen: Nasopharyngeal Swab; Nasopharyngeal(NP) swabs in vial transport medium  Result Value Ref Range Status   SARS Coronavirus 2 by RT PCR NEGATIVE NEGATIVE Final    Comment: (NOTE) SARS-CoV-2 target nucleic acids are NOT DETECTED.  The SARS-CoV-2 RNA is generally detectable in upper respiratory specimens during the acute phase of infection. The lowest concentration of SARS-CoV-2 viral copies this assay can detect is 138 copies/mL. A negative result does not preclude SARS-Cov-2 infection and should not be used as the sole basis for treatment or other patient management decisions. A negative result may occur with  improper specimen collection/handling, submission of specimen  other than nasopharyngeal swab, presence of viral mutation(s) within the areas targeted by this assay, and inadequate number of viral copies(<138 copies/mL). A negative result must be combined with clinical observations, patient history, and epidemiological information. The expected result is Negative.  Fact Sheet for Patients:  EntrepreneurPulse.com.au  Fact Sheet for Healthcare Providers:  IncredibleEmployment.be  This test is no t yet approved or cleared by the [place] FDA and  has been authorized for detection and/or diagnosis of SARS-CoV-2 by FDA under  an Emergency Use Authorization (EUA). This EUA will remain  in effect (meaning this test can be used) for the duration of the COVID-19 declaration under Section 564(b)(1) of the Act, 21 U.S.C.section 360bbb-3(b)(1), unless the authorization is terminated  or revoked sooner.       Influenza A by PCR NEGATIVE NEGATIVE Final   Influenza B by PCR NEGATIVE NEGATIVE Final    Comment: (NOTE) The Xpert Xpress SARS-CoV-2/FLU/RSV plus assay is intended as an aid in the diagnosis of influenza from Nasopharyngeal swab specimens and should not be used as a sole basis for treatment. Nasal washings and aspirates are unacceptable for Xpert Xpress SARS-CoV-2/FLU/RSV testing.  Fact Sheet for Patients: EntrepreneurPulse.com.au  Fact Sheet for Healthcare Providers: IncredibleEmployment.be  This test is not yet approved or cleared by the [place] FDA and has been authorized for detection and/or diagnosis of SARS-CoV-2 by FDA under an Emergency Use Authorization (EUA). This EUA will remain in effect (meaning this test can be used) for the duration of the COVID-19 declaration under Section 564(b)(1) of the Act, 21 U.S.C. section 360bbb-3(b)(1), unless the authorization is terminated or revoked.  Performed at [facility], [place] [address] [place],  [place] [address]     Renal Function: Recent Labs    11/20/20 1700 11/21/20 0210 11/22/20 0140 11/23/20 0647  CREATININE 2.44* 1.88* 1.02* 0.68   Estimated Creatinine Clearance: 88.2 mL/min (by C-G formula based on SCr of 0.68 mg/dL).  Radiologic Imaging: US RENAL  Result Date: 11/22/2020 CLINICAL DATA:  Hydronephrosis. EXAM: RENAL / URINARY TRACT ULTRASOUND COMPLETE COMPARISON:  Noncontrast CT yesterday FINDINGS: Right Kidney: Renal measurements: 13.7 x 5.3 x 3.7 cm = volume: 214 mL. Moderate hydronephrosis. Renal parenchymal echogenicity is normal. No evidence of stone or focal lesion. Left Kidney: Technically limited assessment due to positioning and habitus. Renal measurements: 10.3 x 5.3 x 3.8 cm = volume: 110 mL. Cystic structure in the mid kidney corresponds to dilated renal pelvis on CT yesterday, with moderate hydronephrosis. Bladder: Decompressed by Foley catheter. Other: None. IMPRESSION: Moderate bilateral hydronephrosis persists after bladder decompression by Foley catheter. Electronically Signed   By: Keith Rake M.D.   On: 11/22/2020 21:55    I independently reviewed the above imaging studies.  Impression/Recommendation: 1) UTI with urinary retention:  She is clinically improving with bladder drainage.  Continue appropriate culture sensitive antibiotics for 7-10 days.  She has no real risk factors for a neurogenic bladder (her diabetes is not uncontrolled).  However, she was severely constipated on her CT scan possibly related to dehydration/chronically being bed-bound.  This is the most likely explanation for her urinary retention.  If this is treated, we can consider a voiding trial possibly later during her hospitalization under a controlled setting with prompted voiding after catheter removal.  Her hydronephrosis is secondary to her urinary retention and likely chronic and will take time to resolve.  This is not a concern in the setting of normalized renal function and  clinical improvement of her infection.  I spoke with Dr. Posey Pronto and her daughter about the plan.  Dutch Gray 11/23/2020, 5:04 PM  Pryor Curia. MD   CC: Dr. Yehuda Savannah

## 2020-11-24 DIAGNOSIS — R6520 Severe sepsis without septic shock: Secondary | ICD-10-CM | POA: Diagnosis not present

## 2020-11-24 DIAGNOSIS — R4182 Altered mental status, unspecified: Secondary | ICD-10-CM

## 2020-11-24 DIAGNOSIS — A419 Sepsis, unspecified organism: Secondary | ICD-10-CM | POA: Diagnosis not present

## 2020-11-24 DIAGNOSIS — N179 Acute kidney failure, unspecified: Secondary | ICD-10-CM | POA: Diagnosis not present

## 2020-11-24 LAB — CBC
HCT: 35.4 % — ABNORMAL LOW (ref 36.0–46.0)
Hemoglobin: 11.9 g/dL — ABNORMAL LOW (ref 12.0–15.0)
MCH: 29.4 pg (ref 26.0–34.0)
MCHC: 33.6 g/dL (ref 30.0–36.0)
MCV: 87.4 fL (ref 80.0–100.0)
Platelets: 296 10*3/uL (ref 150–400)
RBC: 4.05 MIL/uL (ref 3.87–5.11)
RDW: 13.6 % (ref 11.5–15.5)
WBC: 8.6 10*3/uL (ref 4.0–10.5)
nRBC: 0 % (ref 0.0–0.2)

## 2020-11-24 LAB — GLUCOSE, CAPILLARY
Glucose-Capillary: 170 mg/dL — ABNORMAL HIGH (ref 70–99)
Glucose-Capillary: 177 mg/dL — ABNORMAL HIGH (ref 70–99)
Glucose-Capillary: 188 mg/dL — ABNORMAL HIGH (ref 70–99)
Glucose-Capillary: 189 mg/dL — ABNORMAL HIGH (ref 70–99)

## 2020-11-24 MED ORDER — POLYETHYLENE GLYCOL 3350 17 G PO PACK
17.0000 g | PACK | Freq: Three times a day (TID) | ORAL | Status: DC
  Administered 2020-11-24 – 2020-11-25 (×3): 17 g via ORAL
  Filled 2020-11-24 (×5): qty 1

## 2020-11-24 MED ORDER — CEPHALEXIN 500 MG PO CAPS
500.0000 mg | ORAL_CAPSULE | Freq: Two times a day (BID) | ORAL | Status: AC
  Administered 2020-11-24 – 2020-11-27 (×8): 500 mg via ORAL
  Filled 2020-11-24 (×8): qty 1

## 2020-11-24 NOTE — Progress Notes (Signed)
  Speech Language Pathology Treatment: Dysphagia  Patient Details Name: RAFEEF LAU MRN: [id number] DOB: 1950-12-20 Today's Date: 11/24/2020 Time: 5456-2563 SLP Time Calculation (min) (ACUTE ONLY): 8.7 min  Assessment / Plan / Recommendation Clinical Impression  Pt was seen for dysphagia treatment. Pt's nurse, Caryl Pina, reported that the pt has been refusing meals today, that she has been screaming, and that her participation in therapy has been limited. Pt accepted thin liquids via straw without symptoms of oropharyngeal dysphagia. She refused all solids and began shouting "No! Noooooo!" following encouragement. It is recommended that her current diet be continued. SLP will continue to follow pt to assess tolerance of solids, but discharge from SLP services is imminent.    HPI HPI: 70 y.o. female  who presented with abnormal breathing and confusion, admitted for acute toxic metabolic encephalopathy. PMH is significant for dementia, T2DM, HTN, HLD, depression. Found to have UTI and sepsis. Reconsulted for swallowing as pt with overt coughing with POs per RN.      SLP Plan  Continue with current plan of care       Recommendations  Diet recommendations: Regular;Thin liquid Liquids provided via: Cup;Straw Medication Administration: Whole meds with puree                Oral Care Recommendations: Oral care BID Follow up Recommendations: Skilled Nursing facility SLP Visit Diagnosis: Dysphagia, unspecified (R13.10) Plan: Continue with current plan of care       Blia Totman I. Hardin Negus, [place], [place] Office number [phone] Pager [place] 11/24/2020, 4:15 PM

## 2020-11-24 NOTE — Subjective & Objective (Addendum)
Feeling better today  O Heart - Regular rate and rhythm.  No murmurs, gallops or rubs.    Lungs:  Normal respiratory effort, chest expands symmetrically. Lungs are clear to auscultation, no crackles or wheezes.

## 2020-11-24 NOTE — Assessment & Plan Note (Addendum)
L 

## 2020-11-24 NOTE — Evaluation (Signed)
Physical Therapy Evaluation and Discharge Patient Details Name: Jacqueline Goodman MRN: [id number] DOB: May 26, 1951 Today's Date: 11/24/2020   History of Present Illness  70 year old with history of progressive dementia, type 2 diabetes hypertension dyslipidemia presenting with altered mental status and severe sepsis, likely of urinary origin. Toxic-metabolic encephalopathy, bil heel and sacral pressure ulcers. discharged from SNF to home 3/24 (due to not participating at SNF--yelling and refusing care) with aide 8 hrs/day, 7 days/week  Clinical Impression   Patient evaluated by Physical Therapy with no further PT needs identified. Patient was not participating in therapies at SNF and was recently discharged home. She is fearful of mobility and begins to cry out/yell with discussion of mobility (no actual attempt to help her move). Her dementia and fear of falling makes her a poor candidate for rehab. See below for equipment needs (if not already obtained when pt discharged home from SNF). PT is signing off. Thank you for this referral.     Follow Up Recommendations Supervision/Assistance - 24 hour (home as PTA vs long-term care)    Equipment Recommendations  Hospital bed;Other (comment);Wheelchair (measurements PT);Wheelchair cushion (measurements PT) (hoyer lift (if family plans to get pt OOB))    Recommendations for Other Services       Precautions / Restrictions Precautions Precautions: Fall Precaution Comments: pt very fearful of falling      Mobility  Bed Mobility Overal bed mobility: Needs Assistance Bed Mobility: Rolling Rolling: Mod assist;+2 for physical assistance         General bed mobility comments: RN in to change sacral dressing and assisted to roll pt to her left; pt refused/too afraid to attempt sit EOB    Transfers                    Ambulation/Gait                Stairs            Wheelchair Mobility    Modified Rankin (Stroke  Patients Only)       Balance                                             Pertinent Vitals/Pain Pain Assessment: Faces Faces Pain Scale: Hurts little more Pain Location: bil knees with PROM Pain Descriptors / Indicators: Discomfort Pain Intervention(s): Monitored during session    Home Living Family/patient expects to be discharged to:: Unsure Living Arrangements: Children                    Prior Function Level of Independence: Needs assistance   Gait / Transfers Assistance Needed: reports she stays in the bed; is afraid to get up OOB     Comments: ?reliable historian, however bil heel and sacral ulcers indicative of someone that is bedbound     Hand Dominance   Dominant Hand: Right    Extremity/Trunk Assessment   Upper Extremity Assessment Upper Extremity Assessment: Defer to OT evaluation    Lower Extremity Assessment Lower Extremity Assessment: RLE deficits/detail;LLE deficits/detail RLE Deficits / Details: AAROM: hip flexion to 50 (primarily limited by tight hamstrings), knee flexion 30, ankle DF to neutral LLE Deficits / Details: AAROM: hip flexion to 60 (primarily limited by tight hamstrings), knee flexion 45, ankle DF to neutral    Cervical / Trunk Assessment Cervical / Trunk Assessment:  Other exceptions Cervical / Trunk Exceptions: overweight  Communication   Communication: Expressive difficulties;Receptive difficulties  Cognition Arousal/Alertness: Awake/alert Behavior During Therapy: Anxious Overall Cognitive Status: No family/caregiver present to determine baseline cognitive functioning                                 General Comments: majority of session pt appeared lucid and cooperative; when began talking about attempting to mobilize/sit EOB she became anxious and fearful and began crying out. Easily calmed and redirected when reminded she didn't need to yell because I was right there. Attempted multiple  times to convince her to try to sit EOB with pt always resistant and afraid she would fall.      General Comments      Exercises     Assessment/Plan    PT Assessment Patent does not need any further PT services  PT Problem List         PT Treatment Interventions      PT Goals (Current goals can be found in the Care Plan section)  Acute Rehab PT Goals Patient Stated Goal: states she wants to walk, yet refuses to attempt mobility beyond rolling PT Goal Formulation: All assessment and education complete, DC therapy    Frequency     Barriers to discharge        Co-evaluation               AM-PAC PT "6 Clicks" Mobility  Outcome Measure Help needed turning from your back to your side while in a flat bed without using bedrails?: Total Help needed moving from lying on your back to sitting on the side of a flat bed without using bedrails?: Total Help needed moving to and from a bed to a chair (including a wheelchair)?: Total Help needed standing up from a chair using your arms (e.g., wheelchair or bedside chair)?: Total Help needed to walk in hospital room?: Total Help needed climbing 3-5 steps with a railing? : Total 6 Click Score: 6    End of Session   Activity Tolerance: Other (comment) (pt fearful of mobility) Patient left: in bed;with call bell/phone within reach;with bed alarm set Nurse Communication: Mobility status PT Visit Diagnosis: Muscle weakness (generalized) (M62.81)    Time: 5809-9833 PT Time Calculation (min) (ACUTE ONLY): 35 min   Charges:   PT Evaluation $PT Eval Low Complexity: 1 Low PT Treatments $Self Care/Home Management: 8-22         Arby Barrette, PT Pager [phone]   Rexanne Mano 11/24/2020, 3:17 PM

## 2020-11-24 NOTE — Progress Notes (Signed)
OT Cancellation Note  Patient Details Name: Jacqueline Goodman MRN: [id number] DOB: 04-12-1951   Cancelled Treatment:    Reason Eval/Treat Not Completed: Fatigue/lethargy limiting ability to participate.  Pt now off bedrest.  Attempted to see, however, pt sleeping soundly and RN asked therapists to hold off on waking pt at this time.  Will reattempt.  [name]., OTR/L Acute Rehabilitation Services Pager [phone] Office [phone]   [name] 11/24/2020, 9:21 AM

## 2020-11-24 NOTE — Progress Notes (Signed)
Called pt's daughter and provided her with an update for today. I also discussed the option of palliative medicine with her given that she is does have significant dementia and multiple co-morbidities. Her daughter would like her mom to have the best quality of life Vs quantity. She is happy for [place] to place a palliative consult. She also said she would like to have her at home with her and her dad. She has organized for an Therapist, sports to be at home 8 hours a day and for the remainder of the time she will always have a family member at home during the other hours. We will continue to update her daily. She was appreciative of the care her mom has received at [facility].  Lattie Haw MD  [place]

## 2020-11-24 NOTE — Progress Notes (Addendum)
Family Medicine Teaching Service Daily Progress Note Intern Pager: [phone]  Patient name: Jacqueline Goodman Medical record number: [id number] Date of birth: 03-06-1951 Age: 70 y.o. Gender: female  Primary Care Provider: Lind Covert, MD Consultants: Urology Code Status: FULL  Pt Overview and Major Events to Date:  3/27: Code sepsis activated in ED;admitted 3/28: B/l hydronephrosis; foley placed 3/29: UCx Proteus; switched from Cefepime to CTX  Assessment and Plan: Ahsha P Justiceis a 70 y.o.femalewho is admitted for severe urosepsis with Proteus. PMH is significant fordementia, T2DM, HTN, HLD, depression.  Urosepsis  B/l Hydroureteronephrosis Severe constipation could be contributing to urinary retention. Foley still in place, patient with adequate UOP, 1,000cc yesterday. Can do prompted voiding trial once stool burden is improved. RN reported last BM on 3/29. UCx with 50,000 colonies of proteus mirabilis, resistant to cipro, nitro and bactrim. Will transition to oral ampicillin today, 7-day total course given hx recurrent infections.  -S/p cefepime (3/27-3/29) -CTX (3/29-3/30) -Transition to Keflex (3/31-4/3) -Tylenol 650 mg PRN fever, mild pain -Strict I/O  Post-renal AKI: Resolved Likely related to retention from stool burden. Creatinine 0.68, GFR >60. -Holding Losartan (normotensive) -Keep foley in until stool burden improves  Bilateral Heel Ulcers (L>R)  Sacral pressure ulcer -Monitor wounds daily -Wound care per consult note -Collagenase daily to sacral wound  Type 2 diabetes CBG's ranging117-177. Only received 4U sliding scale. 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 Vascular dementia would explain this patients overall stepwise decline and prominent impairment  in executive function.  -Delirium precautions -Will discuss with daughter about possible palliative care consult -F/u outpatient in geriatric clinic  Hyperlipidemia:Chronic, stable -Continue home atorvastatin 40 mg daily  Hypertension: Chronic, stable BPs ranging122-152/68-77.Most recent 152/77. -Holding home losartan as BP's stable  Stool Burden Appreciated on CT. -Increase MiraLAX to 17g TID -Senna 1 tablet daily  GERD: Chronic, stable -Continue home famotidine daily  Depression chronic, stable -Continue home citalopram  Moderate protein calorie malnutrition: Chronic. -Glucerna shakes TID between meals  FEN/GI: Regular diet [id number]   Status is: Inpatient  Remains inpatient appropriate because:Inpatient level of care appropriate due to severity of illness   Dispo:  Patient From: Home  Planned Disposition: To be determined  Medically stable for discharge: No     Subjective:  Patient states "I must be feeling bad" this morning upon my arrival but when asked if anything is bothering or hurting her she denies. She states that she ate yesterday but doesn't recall what she had. She tells me that she had a bowel movement yesterday.   Objective: Temp:  [98 F (36.7 C)-98.6 F (37 C)] 98.4 F (36.9 C) (03/31 0416) Pulse Rate:  [75-82] 75 (03/31 0416) Resp:  [16-21] 18 (03/31 0416) BP: (122-150)/(68-76) 137/76 (03/31 0416) SpO2:  [98 %-100 %] 100 % (03/31 0416) Physical Exam: General: Awake, alert, in no distress, oriented to person and place Cardiovascular: RRR without murmur Respiratory: Clear in anterior fields Abdomen: soft, nondistended, nontender in all quadrants, no rebound or guarding Extremities: 2+ radial and DP pulses, without edema  Laboratory: Recent Labs  Lab 11/21/20 0210 11/22/20 0140 11/24/20 0219  WBC 13.6* 9.7 8.6  HGB 12.0 11.6* 11.9*  HCT 36.0 33.9* 35.4*  PLT 331 311 296   Recent Labs  Lab 11/20/20 1700  11/20/20 2119 11/21/20 0210 11/22/20 0140 11/23/20 1950  NA 133*   < > 135 138 140  K 5.3*   < > 4.1 3.6 3.9  CL 104  --  108 110 113*  CO2 12*  --  18* 21* 21*  BUN 102*  --  98* 75* 42*  CREATININE 2.44*  --  1.88* 1.02* 0.68  CALCIUM 8.7*  --  8.6* 8.4* 8.2*  PROT 7.1  --   --   --   --   BILITOT 1.1  --   --   --   --   ALKPHOS 113  --   --   --   --   ALT 32  --   --   --   --   AST 35  --   --   --   --   GLUCOSE 342*  --  260* 84 126*   < > = values in this interval not displayed.   Imaging/Diagnostic Tests: None new.   Sharion Settler, DO 11/24/2020, 6:20 AM PGY-1, [place] Intern pager: [phone], text pages welcome

## 2020-11-24 NOTE — Evaluation (Signed)
Occupational Therapy Evaluation Patient Details Name: Jacqueline Goodman MRN: [id number] DOB: 1951/03/05 Today's Date: 11/24/2020    History of Present Illness 70 year old with history of progressive dementia, type 2 diabetes hypertension dyslipidemia presenting with altered mental status and severe sepsis, likely of urinary origin. Toxic-metabolic encephalopathy, bil heel and sacral pressure ulcers. discharged from SNF to home 3/24 (due to not participating at SNF--yelling and refusing care) with aide 8 hrs/day, 7 days/week   Clinical Impression   Pt admitted with the above diagnosis, and demonstrates the below listed deficits.  She currently requires max - total A for ADLs and functional mobility at bed level.  She only engaged with OT minimally.  She does enjoy easy listening music.  No family available to provide info re: PLOF, but it appears via chart review, that she has had a progressive decline in function and requires assist with ADLs.  Recommend she have 24 hour assist at discharge vs. SNF.  IF she discharges home, she may benefit from [facility] to ensure family is able to safely assist her, and for a trial to see if she can progress with simple ADL tasks once back in a familiar environment. Acute OT will sign off at this time.     Follow Up Recommendations  LTC, vs home with 24 hour caregiver assist and trial of HHOT if she goes back to a familiar environment.    Equipment Recommendations  Hospital bed;Wheelchair; Education officer, environmental for [employer]       Precautions / Restrictions Precautions Precautions: Fall Precaution Comments: pt very fearful of falling      Mobility Bed Mobility Overal bed mobility: Needs Assistance Bed Mobility: Rolling Rolling: Total assist         General bed mobility comments: pt adamantly refused rolling with me this session    Transfers                 General transfer comment: unable    Balance                                            ADL either performed or assessed with clinical judgement   ADL Overall ADL's : Needs assistance/impaired Eating/Feeding: Maximal assistance;Bed level Eating/Feeding Details (indicate cue type and reason): with max prompting, pt ate one bite of cookie and drank 2 sips of H20 with supervision/set up.  She refused further attempts at eating Grooming: Wash/dry face;Minimal assistance;Bed level Grooming Details (indicate cue type and reason): pt washed face with assist for thoroughness Upper Body Bathing: Total assistance;Bed level   Lower Body Bathing: Total assistance;Bed level   Upper Body Dressing : Total assistance;Bed level   Lower Body Dressing: Total assistance;Bed level   Toilet Transfer: Total assistance   Toileting- Clothing Manipulation and Hygiene: Total assistance;Bed level       Functional mobility during ADLs: Total assistance General ADL Comments: pt refused to engage in further ADLs than that listed above     Vision         Perception     Praxis      Pertinent Vitals/Pain Pain Assessment: Faces Faces Pain Scale: No hurt Pain Location: bil knees with PROM Pain Descriptors / Indicators: Discomfort Pain Intervention(s): Monitored during session     Hand Dominance Right   Extremity/Trunk Assessment Upper Extremity Assessment Upper Extremity Assessment: Generalized weakness;RUE deficits/detail;LUE  deficits/detail RUE Deficits / Details: shoulder appear to have limited PROM - difficult to determine if this due to contracture/tightness or pt resistance.  She demonstrates ~3/5-3+/5 strength bil. hands and elbows grossly assessed as she is very limited on how much she would engage RUE Coordination: decreased gross motor;decreased fine motor LUE Coordination: decreased gross motor;decreased fine motor   Lower Extremity Assessment Lower Extremity Assessment: Defer to PT evaluation RLE Deficits / Details: AAROM: hip flexion to  50 (primarily limited by tight hamstrings), knee flexion 30, ankle DF to neutral LLE Deficits / Details: AAROM: hip flexion to 60 (primarily limited by tight hamstrings), knee flexion 45, ankle DF to neutral   Cervical / Trunk Assessment Cervical / Trunk Assessment: Other exceptions Cervical / Trunk Exceptions: overweight   Communication Communication Communication: Expressive difficulties;Receptive difficulties   Cognition Arousal/Alertness: Awake/alert Behavior During Therapy: Flat affect Overall Cognitive Status: No family/caregiver present to determine baseline cognitive functioning                                 General Comments: Pt able to tell me her name and DOB, but is otherwise not oriented.  She can follow intermittent one step commands ~30% of the time with prompting.  She often refuses to engage   General Comments  pt enjoys easy listening music and once it was played, she asked therapist to leave her alone so she could enjoy the music    Exercises     Shoulder Instructions      Home Living Family/patient expects to be discharged to:: Other (Comment) Living Arrangements: Children                               Additional Comments: LTC vs home wiht 24 hour assist      Prior Functioning/Environment Level of Independence: Needs assistance  Gait / Transfers Assistance Needed: reports she stays in the bed; is afraid to get up OOB ADL's / Homemaking Assistance Needed: unreliable historian   Comments: ?reliable historian, however bil heel and sacral ulcers indicative of someone that is bedbound        OT Problem List: Decreased strength;Decreased range of motion;Decreased activity tolerance;Impaired balance (sitting and/or standing);Decreased coordination;Decreased cognition;Decreased safety awareness;Decreased knowledge of use of DME or AE;Impaired UE functional use;Obesity      OT Treatment/Interventions:      OT Goals(Current goals  can be found in the care plan section) Acute Rehab OT Goals Patient Stated Goal: to listen to music OT Goal Formulation: All assessment and education complete, DC therapy  OT Frequency:     Barriers to D/C:            Co-evaluation              AM-PAC OT "6 Clicks" Daily Activity     Outcome Measure Help from another person eating meals?: A Lot Help from another person taking care of personal grooming?: Total Help from another person toileting, which includes using toliet, bedpan, or urinal?: Total Help from another person bathing (including washing, rinsing, drying)?: Total Help from another person to put on and taking off regular upper body clothing?: Total Help from another person to put on and taking off regular lower body clothing?: Total 6 Click Score: 7   End of Session Nurse Communication: Mobility status  Activity Tolerance: Patient limited by lethargy;Other (comment) (cognition) Patient left: in  bed;with call bell/phone within reach  OT Visit Diagnosis: Cognitive communication deficit (V76.160)                Time: 1741-1801 OT Time Calculation (min): 20 min Charges:  OT General Charges $OT Visit: 1 Visit OT Evaluation $OT Eval Moderate Complexity: 1 Mod  [name]., OTR/L Acute Rehabilitation Services Pager [phone] Office [phone]   [name] 11/24/2020, 6:20 PM

## 2020-11-24 NOTE — Progress Notes (Signed)
OT Cancellation Note  Patient Details Name: Jacqueline Goodman MRN: [id number] DOB: [birth year]   Cancelled Treatment:    Reason Eval/Treat Not Completed: Active bedrest order.  Will check back.  [name]., OTR/L Acute Rehabilitation Services Pager [phone] Office [phone]   [name] 11/24/2020, 8:37 AM

## 2020-11-24 NOTE — Plan of Care (Signed)
  Problem: Education: Goal: Knowledge of General Education information will improve Description: Including pain rating scale, medication(s)/side effects and non-pharmacologic comfort measures Outcome: Progressing   Problem: Health Behavior/Discharge Planning: Goal: Ability to manage health-related needs will improve Outcome: Progressing   Problem: Clinical Measurements: Goal: Ability to maintain clinical measurements within normal limits will improve Outcome: Progressing Goal: Will remain free from infection Outcome: Progressing Goal: Diagnostic test results will improve Outcome: Progressing Goal: Respiratory complications will improve Outcome: Progressing Goal: Cardiovascular complication will be avoided Outcome: Progressing   Problem: Activity: Goal: Risk for activity intolerance will decrease Outcome: Progressing   Problem: Nutrition: Goal: Adequate nutrition will be maintained Outcome: Progressing   Problem: Coping: Goal: Level of anxiety will decrease Outcome: Progressing   Problem: Elimination: Goal: Will not experience complications related to bowel motility Outcome: Progressing Goal: Will not experience complications related to urinary retention Outcome: Progressing   Problem: Pain Managment: Goal: General experience of comfort will improve Outcome: Progressing   Problem: Safety: Goal: Ability to remain free from injury will improve Outcome: Progressing   Problem: Skin Integrity: Goal: Risk for impaired skin integrity will decrease Outcome: Progressing   Problem: Fluid Volume: Goal: Compliance with measures to maintain balanced fluid volume will improve Outcome: Progressing   Problem: Health Behavior/Discharge Planning: Goal: Ability to manage health-related needs will improve Outcome: Progressing   Problem: Nutritional: Goal: Ability to make healthy dietary choices will improve Outcome: Progressing   Problem: Clinical Measurements: Goal:  Complications related to the disease process, condition or treatment will be avoided or minimized Outcome: Progressing

## 2020-11-24 NOTE — Assessment & Plan Note (Addendum)
l °

## 2020-11-25 DIAGNOSIS — A419 Sepsis, unspecified organism: Secondary | ICD-10-CM | POA: Diagnosis not present

## 2020-11-25 DIAGNOSIS — Z515 Encounter for palliative care: Secondary | ICD-10-CM

## 2020-11-25 DIAGNOSIS — F0391 Unspecified dementia with behavioral disturbance: Secondary | ICD-10-CM

## 2020-11-25 DIAGNOSIS — Z7189 Other specified counseling: Secondary | ICD-10-CM

## 2020-11-25 DIAGNOSIS — G9341 Metabolic encephalopathy: Secondary | ICD-10-CM

## 2020-11-25 DIAGNOSIS — R4182 Altered mental status, unspecified: Secondary | ICD-10-CM | POA: Diagnosis not present

## 2020-11-25 LAB — GLUCOSE, CAPILLARY
Glucose-Capillary: 186 mg/dL — ABNORMAL HIGH (ref 70–99)
Glucose-Capillary: 197 mg/dL — ABNORMAL HIGH (ref 70–99)
Glucose-Capillary: 208 mg/dL — ABNORMAL HIGH (ref 70–99)
Glucose-Capillary: 251 mg/dL — ABNORMAL HIGH (ref 70–99)

## 2020-11-25 LAB — CULTURE, BLOOD (ROUTINE X 2): Culture: NO GROWTH

## 2020-11-25 MED ORDER — OLANZAPINE 2.5 MG PO TABS
2.5000 mg | ORAL_TABLET | Freq: Every day | ORAL | Status: DC
  Administered 2020-11-25 – 2020-11-27 (×3): 2.5 mg via ORAL
  Filled 2020-11-25 (×4): qty 1

## 2020-11-25 MED ORDER — SORBITOL 70 % SOLN
960.0000 mL | TOPICAL_OIL | Freq: Once | ORAL | Status: AC
  Administered 2020-11-25: 960 mL via RECTAL
  Filled 2020-11-25: qty 473

## 2020-11-25 NOTE — Consult Note (Addendum)
Consultation Note Date: 11/25/2020   Patient Name: Jacqueline Goodman  DOB: 17-May-1951  MRN: [id number]  Age / Sex: 70 y.o., female  PCP: Lind Covert, MD Referring Physician: Lenoria Chime, MD  Reason for Consultation: Establishing goals of care "dementia, poor PO intake, malnutrition"  HPI/Patient Profile: 70 y.o. female  with past medical history of advanced dementia, type 2 diabetes, hyperlipidemia, and HTN presenting to the emergency department on 11/20/2020 with complaint of abnormal breathing and confusion. She was recently discharged from SNF on 3/24 with Augmentin for a UTI. Daughter noted increased confusion at home over the past few days. ED Course: patient was febrile, tachycardic, and tachypneic. Urinalysis was suspicious for infection with large leukocytes and many bacteria. Creatinine elevated to 2.44. Head CT was negative for acute intracranial abnormality.  She was admitted to [facility] with severe sepsis likely from urinary source, acute metabolic encephalopathy, and AKI.   3/28--Found to have bilateral hydronephrosis  3/29--urine culture positive for Proteus Mirabilis  Of note, patient was hospitalized 1/28--2/2 with failure to thrive, protein calorie malnutrition, and COVID-19.    Clinical Assessment and Goals of Care: I have reviewed medical records including EPIC notes, labs and imaging, examined the patient. She is loudly and intermittently yelling out "help", and has been doing this all day per nursing.   I spoke with daughter/Angela to discuss diagnosis, prognosis, GOC, EOL wishes, disposition, and options. Patient is known to PMT - she was followed by our service during her hospitalization in January. I reviewed Palliative Medicine as specialized medical care for people living with serious illness. It focuses on providing relief from the symptoms and stress of a serious illness.    We discussed a brief life review of the patient. [name] shares that her mother is from [place]. She has been married to husband Jeneen Rinks for 19 years. They have 2 children - daughter/Angela and Quarry manager. Patient also has an adopted 54 year old daughter named [place]. 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 She used to enjoy reading, but her vision is now impaired from diabetic neuropathy. [name] has been living at her parents' home to help care for her mother.   As far as functional status, [name] reports that prior to the hospitalization in January, her mother was ambulatory with assistance. Her mobility has significantly declined since then. [name] shares that rehab was "a horrible situation". She feels the family was "kept in the dark" about the patient's condition. She feels that her mother continued to decline while she was in rehab.   I expressed concern that due to her progressive dementia and poor nutritional status, it will be very unlikely for her to clinically improve or to return to her previous functional status.  We discussed her current illness and what it means in the larger context of her ongoing co-morbidities. Detailed discussion was had regarding the diagnosis of dementia and its natural trajectory, emphasizing that it is a progressive illness that worsens  over time. This includes decreased ability to communicate, ambulate, swallow, and maintain continence. Discussed that dementia is ultimately a terminal disease process, and that patient appears to be approaching the end-stage of her trajectory.   I attempted to elicit values and goals of care important to the patient and family. [name] is clear that the goal is for her mother to return home. The difference between aggressive medical intervention and comfort care was considered in light of the patient's goals of care.  I introduced the concept of a comfort path to  [name] and encouraged her to think about at what point she would want to stop full scope medical interventions and focus on comfort and quality of life prolonging life. Introduced hospice philosophy and provided information on home vs residential hospice services - answered all questions.    [name] indicates she is open to home with hospice and requests time to discuss with her father and brother.   Questions and concerns were addressed.  The family was encouraged to call with questions or concerns.    Primary decision maker: Family as a unit - husband, son, and daughter make decisions together. Daughter [name] is the point of contact for the family.     SUMMARY OF RECOMMENDATIONS    DNR/DNI as previously documented  Continue current medical care  Family is clear that goal is for patient to be at home  Family is considering home hospice versus home health with PT  I will follow-up with family tomorrow  Code Status/Advance Care Planning:  DNR  Symptom Management:   Start Olanzapine (ZYPREXA) 2.5 mg at bedtime for agitation/delirium  Palliative Prophylaxis:   Delirium Protocol and Turn Reposition  Additional Recommendations (Limitations, Scope, Preferences):  No Artificial Feeding  Psycho-social/Spiritual:   Created space and opportunity for family to express thoughts and feelings regarding patient's current medical situation.   Emotional support provided   Prognosis:   < 6 months  Discharge Planning: To Be Determined      Primary Diagnoses: Present on Admission: . Sepsis ([place]) . Diabetes mellitus type 2 in obese ([place]) . Dementia with behavioral disturbance ([place]) . Essential hypertension   I have reviewed the medical record, interviewed the patient and family, and examined the patient. The following aspects are pertinent.  Past Medical History:  Diagnosis Date  . Diabetes mellitus without complication ([place])   . Hemorrhoids   . Hypertension   .  Pneumonia    as a child  . PONV (postoperative nausea and vomiting)   . Wound infection after surgery 08/2017    Family History  Problem Relation Age of Onset  . Cancer Mother   . Breast cancer Mother   . Heart disease Father   . Alcohol abuse Father   . Diabetes Father   . Alcohol abuse Sister   . Heart disease Sister    Scheduled Meds: . atorvastatin  40 mg Oral QPC supper  . cephALEXin  500 mg Oral Q12H  . citalopram  20 mg Oral Daily  . collagenase   Topical Daily  . enoxaparin (LOVENOX) injection  40 mg Subcutaneous Q24H  . famotidine  20 mg Oral QPC supper  . feeding supplement (GLUCERNA SHAKE)  237 mL Oral TID BM  . insulin aspart  0-15 Units Subcutaneous TID WC  . lip balm   Topical Daily  . melatonin  3 mg Oral QHS  . polyethylene glycol  17 g Oral TID  . senna  1 tablet Oral Daily  . sorbitol,  milk of mag, mineral oil, glycerin (SMOG) enema  960 mL Rectal Once   Continuous Infusions: PRN Meds:.acetaminophen **OR** acetaminophen Medications Prior to Admission:  Prior to Admission medications   Medication Sig Start Date End Date Taking? Authorizing Provider  acetaminophen (TYLENOL) 325 MG tablet Take 2 tablets (650 mg total) by mouth every 6 (six) hours as needed for mild pain or headache (fever >/= 101). 09/28/20  Yes Ezequiel Essex, MD  amoxicillin-clavulanate (AUGMENTIN) 875-125 MG tablet Take 1 tablet by mouth 2 (two) times daily. 11/18/20  Yes [provider]  atorvastatin (LIPITOR) 40 MG tablet TAKE 1 TABLET BY MOUTH EVERY DAY Patient taking differently: Take 40 mg by mouth daily. 03/07/20  Yes Chambliss, Jeb Levering, MD  BESIVANCE 0.6 % SUSP Apply 1 drop to eye 3 (three) times daily. 04/15/20  Yes [provider]  citalopram (CELEXA) 20 MG tablet Take 20 mg by mouth daily. 11/18/20  Yes [provider]  diphenhydrAMINE (BENADRYL) 25 mg capsule Take 25 mg by mouth at bedtime as needed for sleep.   Yes [provider]  famotidine  (PEPCID) 20 MG tablet Take 1 tablet (20 mg total) by mouth at bedtime. Patient taking differently: Take 20 mg by mouth daily as needed for heartburn or indigestion. 09/28/20  Yes Ezequiel Essex, MD  feeding supplement, GLUCERNA SHAKE, (GLUCERNA SHAKE) LIQD Take 237 mLs by mouth 3 (three) times daily between meals. Sugar Free   Yes [provider]  glimepiride (AMARYL) 2 MG tablet Take 2 mg by mouth daily with breakfast.   Yes [provider]  haloperidol (HALDOL) 0.5 MG tablet Take 0.5 mg by mouth 2 (two) times daily. 11/11/20  Yes [provider]  haloperidol (HALDOL) 1 MG tablet Take 1 mg by mouth at bedtime. 11/18/20  Yes [provider]  LEVEMIR FLEXTOUCH 100 UNIT/ML FlexPen Inject 25 Units into the skin daily. 11/18/20  Yes [provider]  losartan (COZAAR) 25 MG tablet TAKE 1 TABLET BY MOUTH EVERY DAY Patient taking differently: Take 25 mg by mouth daily. 08/02/20  Yes Chambliss, Jeb Levering, MD  melatonin 3 MG TABS tablet Take 1 tablet (3 mg total) by mouth at bedtime. Patient taking differently: Take 6 mg by mouth at bedtime. 09/28/20  Yes Ezequiel Essex, MD  mirtazapine (REMERON) 7.5 MG tablet Take 7.5 mg by mouth at bedtime. 11/18/20  Yes [provider]  Multiple Vitamin (MULTIVITAMIN WITH MINERALS) TABS tablet Take 1 tablet by mouth daily. 09/28/20  Yes Ezequiel Essex, MD  polyethylene glycol (MIRALAX / GLYCOLAX) 17 g packet Take 17 g by mouth daily as needed for mild constipation. 09/28/20  Yes Ezequiel Essex, MD  vitamin C (ASCORBIC ACID) 500 MG tablet Take 500 mg by mouth 2 (two) times daily.   Yes [provider]  Insulin Pen Needle (BD PEN NEEDLE NANO 2ND GEN) 32G X 4 MM MISC Use as directed once daily.  May substitute for needle that fits Kara Mead 03/09/20   Lind Covert, MD  Insulin Syringe-Needle U-100 (B-D INS SYR ULTRAFINE .3CC/31G) 31G X 5/16" 0.3 ML MISC Use as directed 03/07/20   Chambliss, Jeb Levering, MD   ONETOUCH ULTRA test strip USE TO TEST BLOOD SUGAR ONCE EVERY MORNING BEFORE EATING. ICD-10 CODE: E11.9 03/11/20   Lind Covert, MD  Semaglutide,0.25 or 0.5MG /DOS, (OZEMPIC, 0.25 OR 0.5 MG/DOSE,) 2 MG/1.5ML SOPN Inject 0.5 mg into the skin once a week. Patient taking differently: Inject 0.25 mg into the skin once a week. Monday  08/17/20   Lind Covert, MD   Allergies  Allergen Reactions  . Morphine And Related Other (See Comments)    "says doesn't work"  . Tape Rash    Breaks the skin down   Review of Systems  Unable to perform ROS: Dementia    Physical Exam Cardiovascular:     Rate and Rhythm: Tachycardia present.  Pulmonary:     Effort: Pulmonary effort is normal.  Neurological:     Mental Status: She is alert.     Motor: Weakness and tremor present.  Psychiatric:        Behavior: Behavior is agitated.        Cognition and Memory: Cognition is impaired.     Vital Signs: BP 126/79 (BP Location: Right Arm)   Pulse 70   Temp 98.2 F (36.8 C) (Oral)   Resp 13   Ht 5' 8.5" (1.74 m) Comment: from 09/2020  Wt 112.9 kg Comment: from 09/2020  SpO2 98%   BMI 37.31 kg/m  Pain Scale: PAINAD   Pain Score: 0-No pain   SpO2: SpO2: 98 % O2 Device:SpO2: 98 % O2 Flow Rate: .O2 Flow Rate (L/min): 0 L/min  IO: Intake/output summary:   Intake/Output Summary (Last 24 hours) at 11/25/2020 1419 Last data filed at 11/25/2020 0954 Gross per 24 hour  Intake 50 ml  Output --  Net 50 ml    LBM: Last BM Date: 11/21/20 Baseline Weight: Weight: 112.9 kg (from 09/2020) Most recent weight: Weight: 112.9 kg (from 09/2020)      Palliative Assessment/Data: PPS 20-30%     Time In: 1500 Time Out: 1614 Time Total: 74 minutes Greater than 50%  of this time was spent counseling and coordinating care related to the above assessment and plan.  Signed by: Lavena Bullion, NP   Please contact Palliative Medicine Team phone at [phone] for questions and concerns.  For  individual provider: See Shea Evans

## 2020-11-25 NOTE — Progress Notes (Signed)
  Speech Language Pathology Treatment: Dysphagia  Patient Details Name: Jacqueline Goodman MRN: [id number] DOB: 25-Aug-1951 Today's Date: 11/25/2020 Time: 9379-0240 SLP Time Calculation (min) (ACUTE ONLY): 11.3 min  Assessment / Plan / Recommendation Clinical Impression  Pt was seen for dysphagia treatment. Her meal tray was at bedside and pt consumed a single bite of mac and cheese. Pt immediately stated, "It's not good!" and stated that she did not want it. She did swallow the mac and cheese and subsequently consumed consecutive (up to seven) boluses of thin liquids via straw. However, she then refused all additional p.o. intake. No s/sx of aspiration were noted with solids or liquids though trials have been limited due to pt's refusal. Pt stated that she was cold. SLP adjusted to the temperature and pt indicated that she felt better by the end of the session. A regular texture diet with thin liquids is still recommended at this time. Further skilled SLP services are not clinically indicated.    HPI HPI: 70 y.o. female  who presented with abnormal breathing and confusion, admitted for acute toxic metabolic encephalopathy. PMH is significant for dementia, T2DM, HTN, HLD, depression. Found to have UTI and sepsis. Reconsulted for swallowing as pt with overt coughing with POs per RN. GOC: Pt's daughter reported as of 4/1 that the pt would not want a feeding tube; daughter would like pt to be able to eat what she wants when she likes.      SLP Plan  Discharge SLP treatment due to (comment);All goals met       Recommendations  Diet recommendations: Regular;Thin liquid Liquids provided via: Cup;Straw Medication Administration: Whole meds with puree Supervision: Full supervision/cueing for compensatory strategies                Oral Care Recommendations: Oral care BID Follow up Recommendations: Skilled Nursing facility SLP Visit Diagnosis: Dysphagia, unspecified (R13.10) Plan: Discharge SLP  treatment due to (comment);All goals met       Ziyan Schoon I. Hardin Negus, [place], [place] Office number [phone] Pager [place] 11/25/2020, 4:38 PM

## 2020-11-25 NOTE — Progress Notes (Addendum)
Family Medicine Teaching Service Daily Progress Note Intern Pager: [phone]  Patient name: Jacqueline Goodman Medical record number: [id number] Date of birth: 01-16-1951 Age: 70 y.o. Gender: female  Primary Care Provider: Lind Covert, MD Consultants: Urology, palliative care   Code Status: DNR  Pt Overview and Major Events to Date:  3/27 code sepsis upon arrival to ED, admission 3/28 bilateral hydronephrosis 3/29 urine culture demonstrated Proteus, antibiotics changed from cefepime to ceftriaxone  Assessment and Plan: Jacqueline Goodman is a 70 y.o. female admitted for severe urosepsis with Proteus species.  PMH significant for dementia, T2DM, HTN, HLD, depression.  Urosepsis With bilateral hydronephrosis, positive for Proteus mirabilis currently being treated with antibiotics. Constipation likely contributing, had a BM overnight and this AM however will continue Foley for now given AKI. - s/p cefepime (3/27-3/29) and CTX (3/29-3/30) - cephalexin (3/31-), until 4/3 for 7-day total course of antibiotics - strict I/O - continue Foley - urology following, appreciate recommendations  Constipation Improving, received SMOG enema yesterday afternoon and had a BM overnight and this AM. - Miralax 17g TID - senna daily  AKI Cr 1.50 this AM up from Cr 0.68 from most recent labs. Suspect pre-renal in the setting of poor PO intake. - LR 500 cc bolus  HTN On losartan at home which has been held as she was normotensive. Mildly elevated yesterday afternoon but soft BP this AM. Continue to monitor.  T2DM Glucose 344. Continue current plan for now. - moderate SSI - monitor CBG  Pressure ulcers Noted to bilateral heels (left worse than right) and sacrum.  Wound care consulted with the following recommendation: "Cleanse bilateral heels with soap and water. Paint with betadine daily. Open to air. Prevalon boots to bilateral heels to offload pressure. Cleanse sacral wound with NS and pat  dry. Apply Santyl to wound bed. Cover with NS moist gauze. Secure with silicone foam dressing. Change Santyl and gauze daily. Change foam every three days and PRN soilage. Will need bariatric low air loss bed."  Vascular dementia Oriented to self only. - delirium precautions - f/u outpatient geriatric clinic  Goals of care Discussion ongoing between primary team, palliative care, patient and daughter Jacqueline Goodman).  Per daughter, patient had always wanted quality of life over quantity, and that a feeding tube would not be within her goals of care. - DNR - palliative care consulted, appreciate involvement  Chronic protein calorie malnutrition, moderate - Glucerna shakes TID between meals  HLD Chronic, stable. - atorvastatin 40 mg daily  GERD Chronic, stable. - famotidine 20 mg daily  Depression Chronic, stable - citalopram 40 mg daily  FEN/GI: regular diet PPx: LMWH  Disposition: med-tele, dispo pending GOC discussions  Subjective:  NAOE.  Patient states she has not had a BM; however, RN confirmed that she did have a large BM last night.  She had a BM this morning as well which was being cleaned up during encounter.  No concerns at this time, asking to go back to sleep.  Objective: Temp:  [97.6 F (36.4 C)-98.2 F (36.8 C)] 97.6 F (36.4 C) (04/02 0629) Pulse Rate:  [70-96] 95 (04/02 0629) Resp:  [13-23] 14 (04/02 0629) BP: (95-140)/(55-80) 95/55 (04/02 0629) SpO2:  [98 %-100 %] 100 % (04/02 0629) Physical Exam: General: Obese elderly female resting in bed comfortably, NAD Cardiovascular: RRR, no murmurs Respiratory: CTAB Abdomen: soft, non-tender Extremities: WWP GU: Foley catheter collecting amber colored urine  Laboratory: Recent Labs  Lab 11/21/20 0210 11/22/20 0140 11/24/20 3500  WBC 13.6* 9.7 8.6  HGB 12.0 11.6* 11.9*  HCT 36.0 33.9* 35.4*  PLT 331 311 296   Recent Labs  Lab 11/20/20 1700 11/20/20 2119 11/22/20 0140 11/23/20 0647  11/26/20 0550  NA 133*   < > 138 140 134*  K 5.3*   < > 3.6 3.9 4.5  CL 104   < > 110 113* 105  CO2 12*   < > 21* 21* 20*  BUN 102*   < > 75* 42* 28*  CREATININE 2.44*   < > 1.02* 0.68 1.50*  CALCIUM 8.7*   < > 8.4* 8.2* 8.3*  PROT 7.1  --   --   --   --   BILITOT 1.1  --   --   --   --   ALKPHOS 113  --   --   --   --   ALT 32  --   --   --   --   AST 35  --   --   --   --   GLUCOSE 342*   < > 84 126* 344*   < > = values in this interval not displayed.     Imaging/Diagnostic Tests: No new imaging.  Zola Button, MD 11/26/2020, 7:27 AM PGY-1, [place] Intern pager: [phone], text pages welcome

## 2020-11-25 NOTE — Progress Notes (Signed)
FMTS Attending Daily Note: Yehuda Savannah, MD  Team Pager [phone] Pager [phone] I have seen and examined this patient, reviewed their chart. I have discussed this patient with the resident.   This morning, Ms. Simonis is sleeping but awakens to voice.  She denies any pain and asked if she can go back to sleep.  She is oriented only to herself.  Per chart review and her reports she did not have a bowel movement yesterday.  Per her nurse she is mostly only drinking her Glucerna shakes and not eating well.  On exam abdomen is soft with positive bowel sounds.  Foley is in place.  Called and discussed updates with her daughter Aiva Miskell.  Glenard Haring notes that palliative care had reached out to her and is going to call her back.  She notes that she and her mom had had discussions about goals of care and that her mom always wanted quality over quantity.  She notes her recent discussions with Dr. Erin Hearing and remains in agreement to continue with DNR code status.  I discussed her poor p.o. intake and my concern that this would lead to a progressive decline, discussed options and she notes that her mother would not want a feeding tube and that she would like for her mom to be able to eat what she wants when she wants it for the time she has left.  I updated her that we are going to try an enema today to relieve her constipation, and if we can get her bowels moving we will try to remove her Foley and do a voiding trial.  I discussed the options of palliative care and hospice, introducing them with the goal of treating symptoms including constipation urinary retention etc.  I discussed that we are continuing the Keflex for her urinary infection and that she has not had any side effects to this.  I answered all of Angela's questions and concerns and discussed that we will continue to update her daily.  Yehuda Savannah MD

## 2020-11-25 NOTE — Progress Notes (Signed)
Family Medicine Teaching Service Daily Progress Note Intern Pager: [phone]  Patient name: Jacqueline Goodman Medical record number: [id number] Date of birth: June 09, 1951 Age: 70 y.o. Gender: female  Primary Care Provider: Lind Covert, MD Consultants: Urology Code Status: DNR  Pt Overview and Major Events to Date:  3/27 code sepsis upon arrival to ED, admission 3/28 bilateral hydronephrosis 3/29 urine culture demonstrated Proteus, antibiotics changed from cefepime to ceftriaxone  Assessment and Plan: [name] is a 70 yo female admitted for severe urosepsis with Proteus species.  PMH significant for dementia, T2DM, HTN, HLD, depression.  Urosepsis  B/l Hydroureteronephrosis Currently undergoing voiding trial.  S/p cefepime (3/27-29) and ceftriaxone (3/29-30).  Started Keflex 3/31. -Continue Keflex until 4/3 (for 7-day total course antibiotics) -Tylenol 650 as needed -Strict I's and O's  Bilateral Heel Ulcers (L>R)  Sacral pressure ulcer Wound care consulted.  Recommends the following: "Cleanse bilateral heels with soap and water.  Paint with betadine daily. Open to air. Prevalon boots to bilateral heels to offload pressure. Cleanse sacral wound with NS and pat dry. Apply Santyl to wound bed. Cover with NS moist gauze.  Secure with silicone foam dressing. Change Santyl and gauze daily. Change foam every three days and PRN soilage. Will need bariatric low air loss bed." -Orders in place -We will continue to monitor  Hypertension: Chronic, stable BP last 24 hours 140-152/69-77.  On losartan at home.  Losartan previously held during this admission for normotension. -Given BP creeping back up, will consider restarting home losartan in context of palliative care consult  Stool Burden -MiraLAX 17 g 3 times daily -Senna tablet daily   Moderate protein calorie malnutrition: Chronic -Glucerna shakes 3 times daily between meals  Type 2 diabetes Yesterday glucose ranged  177-189.  Received 3 units insulin x3. -Moderate sliding scale insulin -Glucose checks before every meal/nightly  Vascular Dementia without Recent Stroke  Poor PO intake -Delirium precautions -Daughter open to palliative care consult, ordered placed -Follow-up outpatient in geriatric clinic  Hyperlipidemia:Chronic, stable -Continue home atorvastatin 40 mg daily  GERD: Chronic, stable -Continue home Pepcid 20 mg daily  Depression chronic, stable -Continue home citalopram 40 mg daily  FEN/GI: Regular diet PPx: Lovenox 40 mg subcu every 24   Status is: Inpatient  Remains inpatient appropriate because:Inpatient level of care appropriate due to severity of illness   Dispo:  Patient From: Home  Planned Disposition: To be determined  Medically stable for discharge: No      Subjective:  Patient found laying awake in bed staring ahead at wall.  Introduced myself my role, asked how she was doing.  She reported she was doing fine.  Had no complaints.  She started visibly shaking in her hands and head, started to yell no, no intermittently.  Attempted to talk to her about medicine to encourage bowel movement. I asked if I could do anything for her, she said no she were to talk later. As I left the room, she shouted "no, nooooooo" several times.  Objective: Temp:  [97.7 F (36.5 C)-98.2 F (36.8 C)] 98.1 F (36.7 C) (04/01 1634) Pulse Rate:  [68-96] 96 (04/01 1634) Resp:  [13-23] 19 (04/01 1634) BP: (126-145)/(69-80) 140/80 (04/01 1634) SpO2:  [98 %-100 %] 99 % (04/01 1634) Physical Exam: General: awake, oriented to self, distressed, shaking, yelling "no" intermittently without context Cardiovascular: patient appeared pale but perfused Respiratory: no respiratory distress Abdomen: unable to assess Extremities: unable to assess  Laboratory: None last 24 hours.  Imaging/Diagnostic Tests: None last  24 hours.   Ezequiel Essex, MD 11/25/2020, 8:17 PM PGY-1, [place] Intern pager: [phone], text pages welcome

## 2020-11-25 NOTE — Progress Notes (Signed)
Patient ID: Jacqueline Goodman, female   DOB: 1951-06-03, 70 y.o.   MRN: [id number]  Notes reviewed over the last few days.  Once constipation appears adequately treated, it would be reasonable to give her a voiding trial over the weekend with close monitoring of her PVRs or periodic bladder ultrasounds (if she does not communicate need to void) to ensure she does not develop urinary retention recurrently.

## 2020-11-25 NOTE — Plan of Care (Signed)
  Problem: Education: Goal: Knowledge of General Education information will improve Description: Including pain rating scale, medication(s)/side effects and non-pharmacologic comfort measures Outcome: Progressing   Problem: Health Behavior/Discharge Planning: Goal: Ability to manage health-related needs will improve Outcome: Progressing   Problem: Clinical Measurements: Goal: Ability to maintain clinical measurements within normal limits will improve Outcome: Progressing Goal: Will remain free from infection Outcome: Progressing Goal: Diagnostic test results will improve Outcome: Progressing Goal: Respiratory complications will improve Outcome: Progressing Goal: Cardiovascular complication will be avoided Outcome: Progressing   Problem: Activity: Goal: Risk for activity intolerance will decrease Outcome: Progressing   Problem: Nutrition: Goal: Adequate nutrition will be maintained Outcome: Progressing   Problem: Coping: Goal: Level of anxiety will decrease Outcome: Progressing   Problem: Elimination: Goal: Will not experience complications related to bowel motility Outcome: Progressing Goal: Will not experience complications related to urinary retention Outcome: Progressing   Problem: Pain Managment: Goal: General experience of comfort will improve Outcome: Progressing   Problem: Safety: Goal: Ability to remain free from injury will improve Outcome: Progressing   Problem: Skin Integrity: Goal: Risk for impaired skin integrity will decrease Outcome: Progressing   Problem: Fluid Volume: Goal: Compliance with measures to maintain balanced fluid volume will improve Outcome: Progressing   Problem: Health Behavior/Discharge Planning: Goal: Ability to manage health-related needs will improve Outcome: Progressing   Problem: Nutritional: Goal: Ability to make healthy dietary choices will improve Outcome: Progressing   Problem: Clinical Measurements: Goal:  Complications related to the disease process, condition or treatment will be avoided or minimized Outcome: Progressing

## 2020-11-26 DIAGNOSIS — A419 Sepsis, unspecified organism: Secondary | ICD-10-CM | POA: Diagnosis not present

## 2020-11-26 LAB — BASIC METABOLIC PANEL
Anion gap: 9 (ref 5–15)
BUN: 28 mg/dL — ABNORMAL HIGH (ref 8–23)
CO2: 20 mmol/L — ABNORMAL LOW (ref 22–32)
Calcium: 8.3 mg/dL — ABNORMAL LOW (ref 8.9–10.3)
Chloride: 105 mmol/L (ref 98–111)
Creatinine, Ser: 1.5 mg/dL — ABNORMAL HIGH (ref 0.44–1.00)
GFR, Estimated: 37 mL/min — ABNORMAL LOW (ref >60)
Glucose, Bld: 344 mg/dL — ABNORMAL HIGH (ref 70–99)
Potassium: 4.5 mmol/L (ref 3.5–5.1)
Sodium: 134 mmol/L — ABNORMAL LOW (ref 135–145)

## 2020-11-26 LAB — GLUCOSE, CAPILLARY
Glucose-Capillary: 389 mg/dL — ABNORMAL HIGH (ref 70–99)
Glucose-Capillary: 247 mg/dL — ABNORMAL HIGH (ref 70–99)
Glucose-Capillary: 112 mg/dL — ABNORMAL HIGH (ref 70–99)
Glucose-Capillary: 96 mg/dL (ref 70–99)

## 2020-11-26 LAB — CBC
HCT: 38.1 % (ref 36.0–46.0)
Hemoglobin: 12.6 g/dL (ref 12.0–15.0)
MCH: 29.6 pg (ref 26.0–34.0)
MCHC: 33.1 g/dL (ref 30.0–36.0)
MCV: 89.6 fL (ref 80.0–100.0)
Platelets: 324 10*3/uL (ref 150–400)
RBC: 4.25 MIL/uL (ref 3.87–5.11)
RDW: 14 % (ref 11.5–15.5)
WBC: 26.5 10*3/uL — ABNORMAL HIGH (ref 4.0–10.5)
nRBC: 0 % (ref 0.0–0.2)

## 2020-11-26 MED ORDER — POLYETHYLENE GLYCOL 3350 17 G PO PACK: 17.0000 g | PACK | Freq: Every day | ORAL | Status: DC | PRN

## 2020-11-26 MED ORDER — LACTATED RINGERS IV BOLUS
1000.0000 mL | Freq: Once | INTRAVENOUS | Status: AC
  Administered 2020-11-26: 1000 mL via INTRAVENOUS

## 2020-11-26 MED ORDER — LACTATED RINGERS IV BOLUS
500.0000 mL | Freq: Once | INTRAVENOUS | Status: AC
  Administered 2020-11-26: 500 mL via INTRAVENOUS

## 2020-11-26 MED ORDER — LACTATED RINGERS IV SOLN: INTRAVENOUS | Status: DC

## 2020-11-26 MED ORDER — FAMOTIDINE 20 MG PO TABS
10.0000 mg | ORAL_TABLET | Freq: Every day | ORAL | Status: DC
  Administered 2020-11-27 – 2020-11-28 (×2): 10 mg via ORAL
  Filled 2020-11-26 (×2): qty 1

## 2020-11-26 NOTE — TOC Initial Note (Signed)
Transition of Care [facility]) - Initial/Assessment Note    Patient Details  Name: Jacqueline Goodman MRN: [id number] Date of Birth: 1950/09/15  Transition of Care [facility]) CM/SW Contact:    Carles Collet, RN Phone Number: 11/26/2020, 3:39 PM  Clinical Narrative:            Damaris Schooner w patient's daughter Jacqueline Goodman over the phone to discuss recommendations for home hospice care. Discussed her familiarity w home hospice. She has had other family members who received home hospice care, however she could not identify name of provider.  Discussed Medicare ratings and Jacqueline Goodman chose [facility]. Referral placed to Bevely Palmer w [facility] who will follow up with Jacqueline Goodman.         Expected Discharge Plan: Home w Hospice Care Barriers to Discharge: Continued Medical Work up   Patient Goals and CMS Choice Patient states their goals for this hospitalization and ongoing recovery are:: return to home for hospice care CMS Medicare.gov Compare Post Acute Care list provided to:: Other (Comment Required) Choice offered to / list presented to : Adult Children (daughter Jacqueline Goodman)  Expected Discharge Plan and Services Expected Discharge Plan: [place]   Discharge Planning Services: CM Consult   Living arrangements for the past 2 months: [place]: Hospice and [place] Date [place]: 11/26/20 Time [place]: 56 Representative spoke with at [place]: Latanya Presser  Prior Living Arrangements/Services Living arrangements for the past 2 months: [place] with:: Adult Children                   Activities of Daily Living Home Assistive Devices/Equipment: Other (Comment) (UTA) ADL Screening (condition at time of admission) Patient's cognitive ability adequate to safely complete daily activities?: No Is the patient deaf or have difficulty hearing?: Yes Does the patient have difficulty seeing, even when wearing  glasses/contacts?: No Does the patient have difficulty concentrating, remembering, or making decisions?: Yes Patient able to express need for assistance with ADLs?: No Does the patient have difficulty dressing or bathing?: Yes Independently performs ADLs?: No Communication: Independent Dressing (OT): Needs assistance Is this a change from baseline?: Pre-admission baseline Grooming: Needs assistance Is this a change from baseline?: Pre-admission baseline Feeding: Needs assistance Is this a change from baseline?: Pre-admission baseline Bathing: Needs assistance Is this a change from baseline?: Pre-admission baseline Toileting: Needs assistance Is this a change from baseline?: Pre-admission baseline In/Out Bed: Needs assistance Is this a change from baseline?: Pre-admission baseline Walks in Home: Dependent ([place]) Is this a change from baseline?: Pre-admission baseline Does the patient have difficulty walking or climbing stairs?: Yes (UTA) Weakness of Legs: Both Weakness of Arms/Hands: Both  Permission Sought/Granted                  Emotional Assessment              Admission diagnosis:  Hyperglycemia [R73.9] Sepsis ([place]) [A41.9] Chronic heel ulcer, left, with unspecified severity ([place]) [L97.429] Chronic heel ulcer, right, with unspecified severity ([place]) [L97.419] Altered mental status, unspecified altered mental status type [R41.82] Pressure injury of right buttock, stage 2 ([place]) [L89.312] Sepsis with acute renal failure without septic shock, due to unspecified organism, unspecified acute renal failure type ([place]) [A41.9, R65.20, N17.9] Patient Active Problem List   Diagnosis Date Noted  .  Altered mental status   . Pressure injury of skin 11/22/2020  . Acute kidney insufficiency 11/21/2020  . Sepsis ([place]) 11/20/2020  . Dementia with behavioral disturbance ([place]) 09/26/2020  . Delirium 09/26/2020  . Depression, recurrent ([place]) 09/26/2020  . AKI (acute kidney injury)  ([place])   . FTT (failure to thrive) in adult   . Metabolic encephalopathy   . COVID-19 09/23/2020  . Elevated liver enzymes 08/17/2020  . Weight loss 03/15/2020  . Vertigo 05/12/2019  . Obesity 12/25/2017  . Pain in joint, ankle and foot 11/13/2017  . 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 Situational anxiety 03/13/2017  . Hyperlipidemia 07/05/2016  . Essential hypertension 06/28/2016  . Diabetes mellitus type 2 in obese ([place]) 06/07/2016   PCP:  Lind Covert, MD Pharmacy:   [id number] [place], [address] [place] [address] Phone: [phone] Fax: [phone]     Social Determinants of Health (SDOH) Interventions    Readmission Risk Interventions No flowsheet data found.

## 2020-11-26 NOTE — Progress Notes (Signed)
Daily Progress Note   Patient Name: Jacqueline Goodman       Date: 11/26/2020 DOB: 11-Oct-1950  Age: 70 y.o. MRN#: [id number] Attending Physician: Lenoria Chime, MD Primary Care Physician: Lind Covert, MD Admit Date: 11/20/2020  Reason for Consultation/Follow-up: Establishing goals of care  Subjective: Received report from bedside RN. No acute concerns. He reports patient has not eaten today - in fact, she became very agitated when staff tried to encourage her to eat.  I spoke with daughter/Angela by phone. She reports her father (patient's husband) has been very upset because he did not realize her dementia was end-stage. She requests that I provide education on advanced dementia so her father can better understand patient's current condition. [name] is able to bring her father and brother into the room and place the call on speaker phone.   Discussed the diagnosis of dementia and its natural trajectory, emphasizing that it is a progressive illness that worsens over time. This includes decreased ability to communicate, ambulate, swallow, and maintain continence. Discussed that dementia is ultimately a terminal disease process, and that patient appears to be approaching the end-stage of her trajectory.   Reviewed the concept of a comfort path, emphasizing the focus is on comfort and quality of life rather than prolonging life. [name] is clear that the goal of care is to have patient at home and keep her comfortable. Reviewed hospice phiosophy and discussed that they can provide ongoing symptom management for patient's agitation.   Discussed concepts of comfort care in line with hospice philosophy - keeping her clean and dry, no labs, no artificial hydration or feeding, and minimizing of  medications. Discussed comfort feeds - letting patient have any food she wants if/when requested but not pushing her to eat as it will increase her agitation. Discussed that decreased oral intake is a natural part of end-stage dementia.   Family is ultimately agreeable to home hospice. [name] indicates their preference would be [facility].    Length of Stay: 6  Current Medications: Scheduled Meds:  . cephALEXin  500 mg Oral Q12H  . citalopram  20 mg Oral Daily  . collagenase   Topical Daily  . enoxaparin (LOVENOX) injection  40 mg Subcutaneous Q24H  . famotidine  10 mg Oral QPC supper  . feeding supplement ([place])  237 mL Oral TID BM  . lip balm   Topical Daily  . melatonin  3 mg Oral QHS  . OLANZapine  2.5 mg Oral QHS  . senna  1 tablet Oral Daily       PRN Meds: acetaminophen **OR** acetaminophen, polyethylene glycol  Physical Exam Vitals reviewed.  Constitutional:      General: She is sleeping. She is not in acute distress.    Appearance: She is ill-appearing.  Cardiovascular:     Rate and Rhythm: Normal rate.  Pulmonary:     Effort: Pulmonary effort is normal.             Vital Signs: BP (!) 90/56 (BP Location: Right Arm)   Pulse 82   Temp 98 F (36.7 C) (Oral)   Resp 18   Ht 5' 8.5" (1.74 m) Comment: from 09/2020  Wt 112.9 kg Comment: from 09/2020  SpO2 100%   BMI 37.31 kg/m  SpO2: SpO2: 100 % O2 Device: O2 Device: Room Air O2 Flow Rate: O2 Flow Rate (L/min): 0 L/min  Intake/output summary:   Intake/Output Summary (Last 24 hours) at 11/26/2020 1759 Last data filed at 11/26/2020 0600 Gross per 24 hour  Intake --  Output 301 ml  Net -301 ml   LBM: Last BM Date: 11/25/20 Baseline Weight: Weight: 112.9 kg (from 09/2020) Most recent weight: Weight: 112.9 kg (from 09/2020)       Palliative Assessment/Data: PPS 20-30%       Palliative Care Assessment & Plan   HPI/Patient Profile: 70 y.o. female  with past medical history of advanced  dementia, type 2 diabetes, hyperlipidemia, and HTN presenting to the emergency department on 11/20/2020 with complaint of abnormal breathing and confusion. She was recently discharged from SNF on 3/24 with Augmentin for a UTI. Daughter noted increased confusion at home over the past few days. ED Course: patient was febrile, tachycardic, and tachypneic. Urinalysis was suspicious for infection with large leukocytes and many bacteria. Creatinine elevated to 2.44. Head CT was negative for acute intracranial abnormality.  She was admitted to [facility] with severe sepsis likely from urinary source, acute metabolic encephalopathy, and AKI.   3/28--Found to have bilateral hydronephrosis  3/29--urine culture positive for Proteus Mirabilis  Of note, patient was hospitalized 1/28--2/2 with failure to thrive, protein calorie malnutrition, and COVID-19.   Assessment: - severe sepsis secondary to UTI - bilateral hydronephrosis - constipation - AKI - vascular dementia, advanced - poor oral intake, failure to thrive - agitation  Recommendations/Plan:  DNR/DNI as previously documented  Minimize medications to reduce pill burden (discontinued atorvastatin)  D/C IV fluids  Plan for discharge home with hospice care - family has stated preference for [facility]  Family will need a hospital bed   Patient will need to continue wound care at discharge  Continue Olanzapine (ZYPREXA) 2.5 mg at bedtime   PMT will continue to follow  Goals of Care and Additional Recommendations:  Limitations on Scope of Treatment: No Artificial Feeding and No IV Fluids  Code Status:  DNR/DNI  Prognosis:   < 6 months  Discharge Planning:  Home with Hospice  Care plan was discussed with FPTS, TOC/case management, and hospice liaison  Thank you for allowing the Palliative Medicine Team to assist in the care of this patient.   Total Time 66 minutes Prolonged Time Billed  yes      Greater than 50%  of this  time was spent counseling and coordinating care related to the above assessment and  plan.  Lavena Bullion, NP  Please contact Palliative Medicine Team phone at [phone] for questions and concerns.

## 2020-11-26 NOTE — Progress Notes (Signed)
FPTS Interim Progress Note  S: Patient noted to have leukocytosis of 26.5 from CBC drawn prior to comfort care decision. Went to bedside to evaluate patient, she allows me to conduct a physical exam but periodically states "leave me alone let me sleep, I feel fine. Discussed with RN, states that she has had little oral intake and hydration throughout the day as she has been asleep for the majority of the day. He reports that patient has had about 50 ml of urinary output.   O: BP (!) 90/56 (BP Location: Right Arm)   Pulse 82   Temp 98 F (36.7 C) (Oral)   Resp 18   Ht 5' 8.5" (1.74 m) Comment: from 09/2020  Wt 112.9 kg Comment: from 09/2020  SpO2 100%   BMI 37.31 kg/m   General: Patient laying in bed comfortably. CV: RRR, no gallops or murmurs Resp: CTAB good air movement throughout all lung fields, no rales or rhonchi noted Abdomen: soft, nontender, BS+ Derm: no new rashes or lesions noted, skin warm and dry to touch Neuro: AOx2, easily arousable to voice Psych: mildly agitated   A/P: -Patient maintains afebrile status, no sign of acute abdomen or other clinical sign of infection noted. -Updated daughter [name]) and discussed new result along with how this would not change patient's prognosis. She shares that she agrees and wants to focus on maintaining the best quality of life for Ms. Zeisler. Daughter denies any further questions or concerns. -Palliative care following, appreciate continued involvement. Will proceed with plan of comfort care.  -No further work up indicated at this time, discussed with attending, Dr. Ovidio Hanger, Lovette Cliche, DO 11/26/2020, 7:23 PM PGY-1, [place] Medicine Service pager [phone]

## 2020-11-26 NOTE — Progress Notes (Signed)
Manufacturing engineer [facility]) Hospital Liaison: RN note    Notified by Transition of Care Manger, Carles Collet, RN of patient/family request for [facility] services at home after discharge.   Attempted to contact family and left VM. Hospital liaison will reach out again tomorrow to coordinate services after discharge.  A Please do not hesitate to call with questions.   Thank you,   Farrel Gordon, RN, [place] (listed on [facility] under [place])    [phone]

## 2020-11-27 DIAGNOSIS — A419 Sepsis, unspecified organism: Secondary | ICD-10-CM | POA: Diagnosis not present

## 2020-11-27 DIAGNOSIS — R451 Restlessness and agitation: Secondary | ICD-10-CM

## 2020-11-27 LAB — GLUCOSE, CAPILLARY
Glucose-Capillary: 133 mg/dL — ABNORMAL HIGH (ref 70–99)
Glucose-Capillary: 179 mg/dL — ABNORMAL HIGH (ref 70–99)
Glucose-Capillary: 211 mg/dL — ABNORMAL HIGH (ref 70–99)
Glucose-Capillary: 165 mg/dL — ABNORMAL HIGH (ref 70–99)

## 2020-11-27 NOTE — Progress Notes (Signed)
Family Medicine Teaching Service Daily Progress Note Intern Pager: [phone]  Patient name: Jacqueline Goodman Medical record number: [id number] Date of birth: 10/26/1950 Age: 70 y.o. Gender: female  Primary Care Provider: Lind Covert, MD Consultants: Urology, palliative care Code Status: DNR  Pt Overview and Major Events to Date:  3/27 code sepsis upon arrival to ED, admission 3/28 bilateral hydronephrosis 3/29 urine culture demonstrated Proteus, antibiotics changed from cefepime to ceftriaxone 4/1 patient made palliative care 4/3 medically stable for discharge home with hospice  Assessment and Plan: Ms. Alonzo is a 70 year old admitted for severe urosepsis due to isolated Proteus species, now on palliative care.  PMH significant for dementia, T2DM, HTN, HLD, depression.  Urosepsis A.m. vitals stable, patient afebrile.  Patient s/p cefepime (3/27-29) and ceftriaxone (3/29-30).  Now on cephalexin, finally today. -Last day cephalexin today -Continue Foley -Urology following continue recommendations  Constipation Appears to be improving.  S/p smog enema over the weekend with subsequent bowel movement. Bowel movement in bed this morning.  -Continue MiraLAX daily as needed -Continue senna daily  Goals of care Discussion ongoing between primary team, palliative care, patient and daughter Glenard Haring [place]).  Per daughter, patient had always wanted quality of life over quantity, and that a feeding tube would not be within her goals of care. - DNR - palliative care consulted, appreciate involvement - anticipate discharge home with hospice as soon as everything is coordianted  Chronic protein calorie malnutrition, moderate - Glucerna shakes TID between meals  Vascular dementia Oriented to self only, improved mentation today while visitng. Not shouting "no, no". Able to respond with multi-word answers.  -Dementia and delirium precautions  AKI Last labs 4/2 demonstrate  creatinine 1.50, patient was given LR bolus.  No labs this morning.  HTN Patient normotensive this morning, continue holding BP meds.  T2DM A.m. glucose 133.  Patient is on palliative care, sliding scale insulin has been DC'd.  Pressure ulcers Continuing to follow wound care instructions.  HLD Atorvastatin DC'd now that patient is on palliative care.  GERD Chronic, stable. - famotidine 20 mg daily  Depression Chronic, stable - citalopram 40 mg daily  FEN/GI: Regular  PPx: None, SCDs   Status is: Inpatient  Remains inpatient appropriate because:awaiting discharge home with hospice   Dispo:  Patient From: Home  Planned Disposition: Home with hospice services and family care  Medically stable for discharge: Yes      Subjective:  Oriented to self only, improved mentation today while visitng. Not shouting "no, no". Able to respond with multi-word answers. Nurse tech at bedside to change linens as patient has moved her bowels in bed. I told her we were trying to get her home as soon as all her equipment and care is coordinated, patient expresses that she would like this. Nods yes in response to some questions.   Objective: Temp:  [98 F (36.7 C)-98.2 F (36.8 C)] 98.2 F (36.8 C) (04/03 0400) Pulse Rate:  [61-82] 61 (04/03 0400) Resp:  [15-18] 15 (04/03 0400) BP: (90-126)/(54-65) 126/65 (04/03 0400) SpO2:  [95 %-99 %] 95 % (04/03 0400) Physical Exam: General: Awake, alert to self, no distress at this time Respiratory: Unlabored respirations, normal work of breathing, no respiratory distress  Laboratory: Recent Labs  Lab 11/22/20 0140 11/24/20 0219 11/26/20 0550  WBC 9.7 8.6 26.5*  HGB 11.6* 11.9* 12.6  HCT 33.9* 35.4* 38.1  PLT 311 296 324   Recent Labs  Lab 11/20/20 1700 11/20/20 2119 11/22/20 0140 11/23/20 8676  11/26/20 0550  NA 133*   < > 138 140 134*  K 5.3*   < > 3.6 3.9 4.5  CL 104   < > 110 113* 105  CO2 12*   < > 21* 21* 20*  BUN  102*   < > 75* 42* 28*  CREATININE 2.44*   < > 1.02* 0.68 1.50*  CALCIUM 8.7*   < > 8.4* 8.2* 8.3*  PROT 7.1  --   --   --   --   BILITOT 1.1  --   --   --   --   ALKPHOS 113  --   --   --   --   ALT 32  --   --   --   --   AST 35  --   --   --   --   GLUCOSE 342*   < > 84 126* 344*   < > = values in this interval not displayed.    Imaging/Diagnostic Tests: None last 24 hours.   Ezequiel Essex, MD 11/27/2020, 10:01 AM PGY-1, [place] Intern pager: [phone], text pages welcome

## 2020-11-27 NOTE — Progress Notes (Addendum)
Attempted to contact [facility] and hospice hospital liaison regarding timeline for home hospice coordination and expected discharge.  Called Ms. Farrel Gordon [facility] Liaison) at [phone]. No answer, left VM.   Called Ms. Carles Collet, TOC case manager at [phone]. No answer, left VM.   No additional or alternate phone numbers available on AMION. 2W floor secretary does not have additional numbers, only those available on AMION. Please call back work room phone [phone] or pager [phone] so we may have an update and coordinate discharge.   UPDATE Received call back from Ms. Alford Highland, who relayed that she was under the impression from previous notes this patient was still undergoing symptomatic optimization and would discharge home with hospice Monday.  Document has not been ordered.  Ms. Alford Highland will begin ordering equipment, although cannot guarantee delivery home today because it is Sunday.  Likely DC home with hospice and family care tomorrow, Monday 4/4.  Received a call back from Ms. Swist, who relayed that same information. All up to date.   Ezequiel Essex, MD

## 2020-11-27 NOTE — Progress Notes (Addendum)
[facility])  Referral received for hospice services yesterday afternoon. [facility] was not able to make contact with Jacqueline Goodman dtr Jacqueline Goodman.  This writer called Jacqueline Goodman this morning as well, no answer but was able to leave VM requesting return call.  DME was noted to be needed prior to d/c.   [facility] will order this once we can speak with Jacqueline Goodman to confirm d/c plan and needs.  Thank you, Venia Carbon RN, BSN, [place] [facility] Liaison   **addendum, hospice has still not been able to get in touch with Jacqueline Goodman about discharge planning, 11/27/20 @ 1545

## 2020-11-27 NOTE — Progress Notes (Signed)
Daily Progress Note   Patient Name: Jacqueline Goodman       Date: 11/27/2020 DOB: Jan 31, 1951  Age: 70 y.o. MRN#: [id number] Attending Physician: Lenoria Chime, MD Primary Care Physician: Lind Covert, MD Admit Date: 11/20/2020  Reason for Consultation/Follow-up: goals of care  Subjective: Spoke with bedside RN - no acute concerns. Patient has not been yelling out, but does become intermittently agitated from interaction with staff. Oral intake remains minimal.  At bedside, patient appears calm and comfortable at this time.   Attempted to call daughter/Angela to check in - no answer, left a message.  Length of Stay: 7  Current Medications: Scheduled Meds:  . cephALEXin  500 mg Oral Q12H  . citalopram  20 mg Oral Daily  . collagenase   Topical Daily  . enoxaparin (LOVENOX) injection  40 mg Subcutaneous Q24H  . famotidine  10 mg Oral QPC supper  . feeding supplement (GLUCERNA SHAKE)  237 mL Oral TID BM  . lip balm   Topical Daily  . melatonin  3 mg Oral QHS  . OLANZapine  2.5 mg Oral QHS  . senna  1 tablet Oral Daily     PRN Meds: acetaminophen **OR** acetaminophen, polyethylene glycol  Physical Exam Vitals reviewed.  Constitutional:      General: She is not in acute distress.    Appearance: She is ill-appearing.  Cardiovascular:     Rate and Rhythm: Normal rate.  Pulmonary:     Effort: Pulmonary effort is normal.             Vital Signs: BP 129/65 (BP Location: Right Arm)   Pulse 82   Temp 97.6 F (36.4 C) (Oral)   Resp 20   Ht 5' 8.5" (1.74 m) Comment: from 09/2020  Wt 112.9 kg Comment: from 09/2020  SpO2 97%   BMI 37.31 kg/m  SpO2: SpO2: 97 % O2 Device: O2 Device: Room Air O2 Flow Rate: O2 Flow Rate (L/min): 0 L/min  Intake/output summary:    Intake/Output Summary (Last 24 hours) at 11/27/2020 1625 Last data filed at 11/27/2020 1300 Gross per 24 hour  Intake 239 ml  Output 500 ml  Net -261 ml   LBM: Last BM Date: 11/25/20 Baseline Weight: Weight: 112.9 kg (from 09/2020) Most recent weight: Weight: 112.9 kg (from 09/2020)  Palliative Assessment/Data: 20-30%      Palliative Care Assessment & Plan   HPI/Patient Profile:69 y.o.femalewith past medical history of advanced dementia, type 2 diabetes, hyperlipidemia, and HTN presenting to the emergency departmenton 3/27/2022with complaint of abnormal breathing and confusion.She was recently discharged from SNF on 3/24 with Augmentin for a UTI. Daughter noted increased confusion at home over the past few days. ED Course: patient was febrile, tachycardic, and tachypneic. Urinalysis was suspicious for infection with large leukocytes and many bacteria. Creatinine elevated to 2.44. Head CT was negative for acute intracranial abnormality.  She was admitted to [facility] with severe sepsis likely from urinary source, acute metabolic encephalopathy, and AKI.   3/28--Found to have bilateral hydronephrosis  3/29--urine culture positive for Proteus Mirabilis  Of note, patient was hospitalized 1/28--2/2 with failure to thrive, protein calorie malnutrition, and COVID-19.  Assessment: - severe sepsis secondary to UTI - bilateral hydronephrosis - constipation - AKI - vascular dementia, advanced - poor oral intake, failure to thrive - agitation  Recommendations/Plan:  DNR/DNI as previously documented  Continue gentle supportive medical treatment  Pending discharge home with hospice  Continue Olanzapine (ZYPREXA) 2.5 mg at bedtime   PMT will continue to follow  Goals of Care and Additional Recommendations:  Limitations on Scope of Treatment: no artificial feeding and no IV fluids  Code Status: DNR/DNI  Prognosis:   less than 6 months  Discharge Planning:  Home  with hospice   Thank you for allowing the Palliative Medicine Team to assist in the care of this patient.   Total Time 15 minutes Prolonged Time Billed  no       Greater than 50%  of this time was spent counseling and coordinating care related to the above assessment and plan.  Lavena Bullion, NP  Please contact Palliative Medicine Team phone at [phone] for questions and concerns.

## 2020-11-27 NOTE — Plan of Care (Signed)
  Problem: Clinical Measurements: Goal: Respiratory complications will improve Outcome: Progressing   Problem: Activity: Goal: Risk for activity intolerance will decrease Outcome: Progressing   Problem: Coping: Goal: Level of anxiety will decrease Outcome: Progressing   Problem: Pain Managment: Goal: General experience of comfort will improve Outcome: Progressing   

## 2020-11-28 ENCOUNTER — Other Ambulatory Visit: Payer: Self-pay | Admitting: Family Medicine

## 2020-11-28 LAB — GLUCOSE, CAPILLARY: Glucose-Capillary: 182 mg/dL — ABNORMAL HIGH (ref 70–99)

## 2020-11-28 MED ORDER — SENNA 8.6 MG PO TABS: 1.0000 | ORAL_TABLET | Freq: Every day | ORAL | 0 refills | Status: AC

## 2020-11-28 MED ORDER — POLYETHYLENE GLYCOL 3350 17 G PO PACK: 17.0000 g | PACK | Freq: Every day | ORAL | 0 refills | Status: AC | PRN

## 2020-11-28 MED ORDER — OLANZAPINE 2.5 MG PO TABS: 2.5000 mg | ORAL_TABLET | Freq: Every day | ORAL | 0 refills | Status: AC

## 2020-11-28 MED ORDER — GLUCERNA SHAKE PO LIQD: 237.0000 mL | Freq: Three times a day (TID) | ORAL | 0 refills | Status: AC

## 2020-11-28 MED ORDER — FAMOTIDINE 10 MG PO TABS: 10.0000 mg | ORAL_TABLET | Freq: Every day | ORAL | 0 refills | Status: AC

## 2020-11-28 MED ORDER — CITALOPRAM HYDROBROMIDE 20 MG PO TABS: 20.0000 mg | ORAL_TABLET | Freq: Every day | ORAL | 0 refills | Status: AC

## 2020-11-28 NOTE — Discharge Summary (Addendum)
Jacqueline Goodman  Patient name: Jacqueline Goodman Needle Medical record number: [id number] Date of birth: Sep 03, 1950 Age: 70 y.o. Gender: female Date of Admission: 11/20/2020  Date of Discharge: 11/28/2020 Admitting Physician: Martyn Malay, MD  Primary Care Provider: Lind Covert, MD Consultants: Urology, palliative care  Indication for Hospitalization: Severe urosepsis due to Proteus species  Discharge Diagnoses/Problem List:  Severe urosepsis; resolved Constipation Chronic protein calorie malnutrition Vascular dementia Pressure ulcers Hypertension GERD Depression  Disposition: Home with hospice and family care  Discharge Condition: Stable  Discharge Exam:  Temp:  [97.6 F (36.4 C)-98.4 F (36.9 C)] 97.9 F (36.6 C) (04/04 0341) Pulse Rate:  [75-82] 75 (04/04 0341) Resp:  [18-20] 18 (04/04 0341) BP: (129-156)/(65-77) 153/77 (04/04 0341) SpO2:  [97 %-99 %] 99 % (04/04 0341) Physical Exam: General: awake, no distress Cardiovascular: RRR Respiratory: CTAB Extremities: no BLE edema  Brief Hospital Course:  JENNA ROUTZAHN is a 70 y.o. female who presented with AMS, thought to be due to urosepsis and worsening dementia. PMH is significant for dementia, T2DM, HTN, HLD, depression. Below is her hospital course listed by problem.   Severe Urosepsis  In ED patient was tachycardic, tachypnic and febrile. Code sepsis called. Patient received 3L LR, blood cultures collected and she was started on vancomycin, cefepime and flagyl. Urine culture unfortunately collected after initiation of antibiotics. Vitals normalized after fluid administration. Lactic acid was normal, and WBC only mildly elevated to 11.4. Head CT negative for acute intracranial abnormality. UA with large leukocytes, >50 RBC, >50 WBC and many bacteria. Vanc and flagyl d/c on HOD #2 given primary concern for urinary source. Urine culture grew proteus mirabilis and abx  transitioned to CTX on HOD#3 (3/29-3/30), eventually switched to Keflex 3/31 to continue 7 day course (end date 4/3). Repeat renal U/S showed moderate hydronephrosis despite bladder decompression. Urology consulted and felt this was likely chronic but also affected by large stool burden. Efforts were made to decrease stool burden and were successful. After discussion of risks and benefits with family, the decision was made to leave the foley catheter in place at discharge. Discharging home with hospice and family care.   Vascular Dementia  Poor intake  CT negative for acute abnormality. Given her step-wise and rapid decline, MRI brain was obtained and showed chronic microvascular changes consistent with vascular dementia. Patient was intermittently irritable and shouting but did not require any PRN medication for this. Delirium precautions maintained during hospitalization. Given decreased PO intake and cognitive decline, palliative care consult was made. After extensive family discussion, the patient was moved to DNR status and palliative care. Discharged home on hospice with family care.   Pressure Ulcers Noted on b/l heels L>R and sacrum on admission. Wound consult placed. Care was taken to these areas and moon boots applied to heels to offload pressure.   AKI Creatinine elevated 1.1>2.44 from HOD #1 to HOD #2. Likely due to dehydration (initially NPO until formal swallow eval performed) and vancomycin. Vancomycin was d/c on HOD #2. Fluids were maintained at maintenance rate. Creatinine improved to baseline by HOD #3 and fluids able to be discontinued by day #4 with stable creatinine. Creatinine 1.5 on d/c.   Stool Burden Appreciated on abdominal CT. Patient started on MiraLAX 17 g BID which was eventually increased to TID. Also given senna tablets. It was thought that stool burden was contributing to patients urinary obstruction and hydronephrosis.   Type 2 DM Hgb A1c 8.9. Initially there was  concern for possible DKA given CBG elevated to 342 in ED but VBG with normal pH. AG was increased to 17, BHB 2.37 but thought to be due to sepsis and poor oral intake. Follow up VBG normalized and anion gap closed. Glimepride held on admission. Patient was started on Levemir 10U nightly but this eventually was held given CBG's <200 and several <100.   Other chronic conditions stable.    Issues for Follow Up:  1. None  Significant Procedures: None  Significant Labs and Imaging:  Recent Labs  Lab 11/22/20 0140 11/24/20 0219 11/26/20 0550  WBC 9.7 8.6 26.5*  HGB 11.6* 11.9* 12.6  HCT 33.9* 35.4* 38.1  PLT 311 296 324   Recent Labs  Lab 11/22/20 0140 11/23/20 0647 11/26/20 0550  NA 138 140 134*  K 3.6 3.9 4.5  CL 110 113* 105  CO2 21* 21* 20*  GLUCOSE 84 126* 344*  BUN 75* 42* 28*  CREATININE 1.02* 0.68 1.50*  CALCIUM 8.4* 8.2* 8.3*    PORTABLE CHEST 1 VIEW 11/20/2020 COMPARISON:  Radiograph 09/23/2020 FINDINGS: Lung volumes are low. Stable heart size and mediastinal contours, accentuated by low lung volumes. There is bronchovascular crowding. No focal consolidation. No pneumothorax or significant pleural effusion. No acute osseous abnormalities are seen. IMPRESSION: Low lung volumes with bronchovascular crowding. No evidence of focal Consolidation.  CT HEAD WITHOUT CONTRAST 11/20/2020 1. No acute intracranial abnormality. 2. Almost complete opacification of the right maxillary sinus and marked mucosal thickening of the left maxillary sinus with associated high density within bilateral maxillary sinuses. Differential diagnosis includes fungal sinus disease, inspissated secretions, and less likely acute hemorrhage into sinus.  CT ABDOMEN AND PELVIS WITHOUT CONTRAST 11/21/2020 1. Bilateral hydroureteronephrosis above a distended urinary bladder. Bladder wall thickening could reflect superimposed cystitis. 2. Rectal stool distention to 8 cm.  MRI HEAD WITHOUT  CONTRAST 11/21/2020  1. No acute intracranial abnormality. 2. Chronic atrophy and moderate white matter disease likely reflects the sequela of chronic microvascular ischemia. 3. Chronic right greater than left maxillary and ethmoid sinus disease.  RENAL / URINARY TRACT ULTRASOUND COMPLETE 11/22/2020 Moderate bilateral hydronephrosis persists after bladder decompression by Foley catheter.  Results/Tests Pending at Time of Discharge: None  Discharge Medications:  Allergies as of 11/28/2020      Reactions   Morphine And Related Other (See Comments)   "says doesn't work"   Tape Rash   Breaks the skin down      Medication List    STOP taking these medications   amoxicillin-clavulanate 875-125 MG tablet Commonly known as: AUGMENTIN   atorvastatin 40 MG tablet Commonly known as: LIPITOR   BD Pen Needle Nano 2nd Gen 32G X 4 MM Misc Generic drug: Insulin Pen Needle   Besivance 0.6 % Susp Generic drug: Besifloxacin HCl   diphenhydrAMINE 25 mg capsule Commonly known as: BENADRYL   glimepiride 2 MG tablet Commonly known as: AMARYL   haloperidol 0.5 MG tablet Commonly known as: HALDOL   haloperidol 1 MG tablet Commonly known as: HALDOL   Insulin Syringe-Needle U-100 31G X 5/16" 0.3 ML Misc Commonly known as: B-D INS SYR ULTRAFINE .3CC/31G   Levemir FlexTouch 100 UNIT/ML FlexPen Generic drug: insulin detemir   losartan 25 MG tablet Commonly known as: COZAAR   mirtazapine 7.5 MG tablet Commonly known as: REMERON   multivitamin with minerals Tabs tablet   OneTouch Ultra test strip Generic drug: glucose blood   Ozempic (0.25 or 0.5 MG/DOSE) 2 MG/1.5ML Sopn Generic drug: Semaglutide(0.25 or 0.5MG /DOS)  vitamin C 500 MG tablet Commonly known as: ASCORBIC ACID     TAKE these medications   acetaminophen 325 MG tablet Commonly known as: TYLENOL Take 2 tablets (650 mg total) by mouth every 6 (six) hours as needed for mild pain or headache (fever >/= 101).    citalopram 20 MG tablet Commonly known as: CELEXA Take 1 tablet (20 mg total) by mouth daily. What changed:   medication strength  how much to take  Another medication with the same name was removed. Continue taking this medication, and follow the directions you see here.   famotidine 10 MG tablet Commonly known as: PEPCID Take 1 tablet (10 mg total) by mouth daily after supper. What changed:   medication strength  how much to take  when to take this   feeding supplement (GLUCERNA SHAKE) Liqd Take 237 mLs by mouth 3 (three) times daily between meals. What changed: additional instructions   melatonin 3 MG Tabs tablet Take 1 tablet (3 mg total) by mouth at bedtime. What changed: how much to take   OLANZapine 2.5 MG tablet Commonly known as: ZYPREXA Take 1 tablet (2.5 mg total) by mouth at bedtime.   polyethylene glycol 17 g packet Commonly known as: MIRALAX / GLYCOLAX Take 17 g by mouth daily as needed for mild constipation.   senna 8.6 MG Tabs tablet Commonly known as: SENOKOT Take 1 tablet (8.6 mg total) by mouth daily.       Discharge Instructions: Please refer to Patient Instructions section of EMR for full details.  Patient was counseled important signs and symptoms that should prompt return to medical care, changes in medications, dietary instructions, activity restrictions, and follow up appointments.   Follow-Up Appointments:  Follow-up Information    [facility] Follow up.   Specialty: Hospice and Palliative Medicine Contact information: [place] [phone]              Ezequiel Essex, MD 11/28/2020, 3:12 PM PGY-1, [place]

## 2020-11-28 NOTE — Progress Notes (Signed)
Just reached out to [employer], the [facility]. She confirmed that this hospice agency regularly does foley catheter care and can absolutely manage Jacqueline Goodman's foley if her family elects to keep it in place when going home.   Will call daughter to discuss options and offer to keep foley in place when discharging home.   Jacqueline Goodman also relayed that there is a bit of a delay in the bed situation at home. The family has to remove her regular bed in order to fit the electric hospital bed in the home. This is currently in process but may take several hours to set up completely.   Ezequiel Essex, MD

## 2020-11-28 NOTE — TOC Transition Note (Signed)
Transition of Care [facility]) - CM/SW Discharge Note   Patient Details  Name: Jacqueline Goodman MRN: [id number] Date of Birth: 12/19/1950  Transition of Care [facility]) CM/SW Contact:  Joanne Chars, LCSW Phone Number: 11/28/2020, 3:16 PM   Clinical Narrative: Pt discharging home with home hospice, [facility].  Per [place], all set with equipment.  CSW spoke with daughter Jacqueline Goodman earlier and LM to confirm at time transport was called.          Final next level of care: Home w Hospice Care Barriers to Discharge: Barriers Resolved   Patient Goals and CMS Choice Patient states their goals for this hospitalization and ongoing recovery are:: comfort CMS Medicare.gov Compare Post Acute Care list provided to:: Other (Comment Required) Choice offered to / list presented to : Adult Children (daughter Jacqueline Goodman)  Discharge Placement                Patient to be transferred to facility by: [place] Name of family member notified: Daughter Jacqueline Goodman Patient and family notified of of transfer: 11/28/20  Discharge Plan and Services   Discharge Planning Services: CM Consult                        [facility] Agency: Hospice and [place] Date [place]: 11/26/20 Time [place]: 1539 Representative spoke with at [place]: [place] Determinants of Health ([place]) Interventions     Readmission Risk Interventions No flowsheet data found.

## 2020-11-28 NOTE — Progress Notes (Signed)
Reached out to the patient's daughter, Maia Handa, at [phone]. Discussed risks and benefits of leaving the foley catheter in place at time of discharge. Daughter elects to leave foley in place for discharge.   Ezequiel Essex, MD

## 2020-11-28 NOTE — Progress Notes (Signed)
Family Medicine Teaching Service Daily Progress Note Intern Pager: [phone]  Patient name: Jacqueline Goodman Medical record number: [id number] Date of birth: 1951-08-05 Age: 70 y.o. Gender: female  Primary Care Provider: Lind Covert, MD Consultants: Urology, palliative care Code Status: DNR  Pt Overview and Major Events to Date:  3/27 code sepsis upon arrival to ED, admission 3/28 bilateral hydronephrosis 3/29 urine culture demonstrated Proteus, antibiotics changed from cefepime to ceftriaxone 4/1 patient made palliative care 4/3 medically stable for discharge home with hospice  Assessment and Plan: Ms. Kalka is a 70 year old female initially admitted for severe urosepsis due to Proteus species, which has since improved; she is subsequently on palliative care after discussion with family.  PMH difficult for dementia, T2DM, HTN, HLD, depression.  Goals of care  Palliative care Discussion ongoing between primary team, palliative care, patient and daughter Glenard Haring Knoke). Per daughter, patient had always wanted quality of life over quantity, and that a feeding tube would not be within her goals of care. -DNR -Palliative care on board, appreciate involvement and coordination -Working with home hospice agency to coordinate DME placement and home -Hopeful for discharge home with hospice and family care today 4/4  Urosepsis Morning vitals stable, patient afebrile.  Somewhat hypertensive, BP is morning 153/77.  Patient finished 7-day course of antibiotics yesterday 4/3.  S/p cefepime (3/27-29), ceftriaxone (3/29-30), cephalexin (4/1-3). -Continue Foley -Urology following, appreciate recommendations  Constipation -Continue MiraLAX daily as needed -Continue senna daily  Chronic protein calorie malnutrition, moderate -Continue glycerin is just shakes 3 times daily between meals as tolerated  Vascular dementia -Continue to mention delirium precautions.  Pressure  ulcers -Continue to follow established wound care instructions  HTN BP elevated this morning at 153/77, pulse 75.  With palliative care discussion, family electing to hold hypertension medications.   GERD -Continue famotidine 20 mg daily  Depression -Continue citalopram 40 mg daily  FEN/GI: Regular PPx: Lovenox   Status is: Inpatient  Remains inpatient appropriate because:awaiting DC to home with hospice   Dispo:  Patient From: Home  Planned Disposition: Home Hospice  Medically stable for discharge: No     Subjective:  Agitated, says she just wants to sleep. No complaints. Looking forward to going home.   Objective: Temp:  [97.6 F (36.4 C)-98.4 F (36.9 C)] 97.9 F (36.6 C) (04/04 0341) Pulse Rate:  [75-82] 75 (04/04 0341) Resp:  [18-20] 18 (04/04 0341) BP: (129-156)/(65-77) 153/77 (04/04 0341) SpO2:  [97 %-99 %] 99 % (04/04 0341) Physical Exam: General: awake, no distress Cardiovascular: RRR Respiratory: CTAB Extremities: no BLE edema  Laboratory: Recent Labs  Lab 11/22/20 0140 11/24/20 0219 11/26/20 0550  WBC 9.7 8.6 26.5*  HGB 11.6* 11.9* 12.6  HCT 33.9* 35.4* 38.1  PLT 311 296 324   Recent Labs  Lab 11/22/20 0140 11/23/20 0647 11/26/20 0550  NA 138 140 134*  K 3.6 3.9 4.5  CL 110 113* 105  CO2 21* 21* 20*  BUN 75* 42* 28*  CREATININE 1.02* 0.68 1.50*  CALCIUM 8.4* 8.2* 8.3*  GLUCOSE 84 126* 344*    Imaging/Diagnostic Tests: None last 24 hours.  Ezequiel Essex, MD 11/28/2020, 7:27 AM PGY-1, [place] Intern pager: [phone], text pages welcome

## 2020-11-28 NOTE — Progress Notes (Signed)
Manufacturing engineer (ACC)  Received request over the weekend from [facility] for hospice services at home after discharge.  Chart and pt information under review by [facility] physician.  Hospice eligibility pending at this time.  Hospital liaison spoke with pt's daughter Anglea to initiate education related to hospice philosophy and services and to answer any questions at this time.  [name]    verbalized understanding of information given.  Per discussion the plan is to discharge home today by [facility].    [place] send signed and completed DNR home with pt/family.  Please provide prescriptions at discharge as needed to ensure ongoing symptom management until pt can be admitted onto hospice services.    DME needs discussed.  Fully electric hospital bed and overbed will be ordered for delivery today.  Pt has a semi-electric bed that will need to be picked up before the new bed can be delivered, due to space constraints. [name] has been advised to call for pick up of that bed ASAP.  Address has been verified and is correct in the chart.  ACC information and contact numbers given to [facility].  Above information shared with Aggie Moats Manager.  Please call with any questions or concerns.  Thank you for the opportunity to participate in this pt's care.  Domenic Moras, BSN, RN [employer] [phone] (24h on call)

## 2020-11-28 NOTE — Progress Notes (Signed)
[facility])  Chart and pt information have been reviewed by [facility] physician.  Hospice eligibility confirmed.  Hospital liaison spoke with pt's daughter Anglea to confirm that DME has been delivered.  TOC Greg made aware in voicemail.   Thank you for the opportunity to participate in this pt's care.  Domenic Moras, BSN, RN [employer] [phone] (24h on call)

## 2020-11-29 ENCOUNTER — Telehealth: Payer: Self-pay | Admitting: *Deleted

## 2020-11-29 DIAGNOSIS — Z794 Long term (current) use of insulin: Secondary | ICD-10-CM | POA: Diagnosis not present

## 2020-11-29 DIAGNOSIS — E43 Unspecified severe protein-calorie malnutrition: Secondary | ICD-10-CM | POA: Diagnosis not present

## 2020-11-29 DIAGNOSIS — K649 Unspecified hemorrhoids: Secondary | ICD-10-CM | POA: Diagnosis not present

## 2020-11-29 DIAGNOSIS — F32A Depression, unspecified: Secondary | ICD-10-CM | POA: Diagnosis not present

## 2020-11-29 DIAGNOSIS — A419 Sepsis, unspecified organism: Secondary | ICD-10-CM | POA: Diagnosis not present

## 2020-11-29 DIAGNOSIS — N1330 Unspecified hydronephrosis: Secondary | ICD-10-CM | POA: Diagnosis not present

## 2020-11-29 DIAGNOSIS — G4700 Insomnia, unspecified: Secondary | ICD-10-CM | POA: Diagnosis not present

## 2020-11-29 DIAGNOSIS — L89620 Pressure ulcer of left heel, unstageable: Secondary | ICD-10-CM | POA: Diagnosis not present

## 2020-11-29 DIAGNOSIS — L304 Erythema intertrigo: Secondary | ICD-10-CM | POA: Diagnosis not present

## 2020-11-29 DIAGNOSIS — Z8669 Personal history of other diseases of the nervous system and sense organs: Secondary | ICD-10-CM | POA: Diagnosis not present

## 2020-11-29 DIAGNOSIS — F0151 Vascular dementia with behavioral disturbance: Secondary | ICD-10-CM | POA: Diagnosis not present

## 2020-11-29 DIAGNOSIS — F419 Anxiety disorder, unspecified: Secondary | ICD-10-CM | POA: Diagnosis not present

## 2020-11-29 DIAGNOSIS — L89159 Pressure ulcer of sacral region, unspecified stage: Secondary | ICD-10-CM | POA: Diagnosis not present

## 2020-11-29 DIAGNOSIS — Z8616 Personal history of COVID-19: Secondary | ICD-10-CM | POA: Diagnosis not present

## 2020-11-29 DIAGNOSIS — K219 Gastro-esophageal reflux disease without esophagitis: Secondary | ICD-10-CM | POA: Diagnosis not present

## 2020-11-29 DIAGNOSIS — Z466 Encounter for fitting and adjustment of urinary device: Secondary | ICD-10-CM | POA: Diagnosis not present

## 2020-11-29 DIAGNOSIS — I10 Essential (primary) hypertension: Secondary | ICD-10-CM | POA: Diagnosis not present

## 2020-11-29 DIAGNOSIS — E119 Type 2 diabetes mellitus without complications: Secondary | ICD-10-CM | POA: Diagnosis not present

## 2020-11-29 DIAGNOSIS — B964 Proteus (mirabilis) (morganii) as the cause of diseases classified elsewhere: Secondary | ICD-10-CM | POA: Diagnosis not present

## 2020-11-29 DIAGNOSIS — L89612 Pressure ulcer of right heel, stage 2: Secondary | ICD-10-CM | POA: Diagnosis not present

## 2020-11-29 DIAGNOSIS — E8809 Other disorders of plasma-protein metabolism, not elsewhere classified: Secondary | ICD-10-CM | POA: Diagnosis not present

## 2020-11-29 DIAGNOSIS — N390 Urinary tract infection, site not specified: Secondary | ICD-10-CM | POA: Diagnosis not present

## 2020-11-29 DIAGNOSIS — E785 Hyperlipidemia, unspecified: Secondary | ICD-10-CM | POA: Diagnosis not present

## 2020-11-29 DIAGNOSIS — M1990 Unspecified osteoarthritis, unspecified site: Secondary | ICD-10-CM | POA: Diagnosis not present

## 2020-11-29 DIAGNOSIS — E669 Obesity, unspecified: Secondary | ICD-10-CM | POA: Diagnosis not present

## 2020-11-29 NOTE — Telephone Encounter (Signed)
Lisa from Hospice is calling to ask for some pain medication to be called into [facility] for pt.  She is having a "considerable amount of pain with dressing changes etc".  Please call her with decision or questions. Christen Bame, CMA

## 2020-11-30 ENCOUNTER — Telehealth: Payer: Self-pay

## 2020-11-30 ENCOUNTER — Telehealth: Payer: Self-pay | Admitting: Family Medicine

## 2020-11-30 DIAGNOSIS — E119 Type 2 diabetes mellitus without complications: Secondary | ICD-10-CM | POA: Diagnosis not present

## 2020-11-30 DIAGNOSIS — E8809 Other disorders of plasma-protein metabolism, not elsewhere classified: Secondary | ICD-10-CM | POA: Diagnosis not present

## 2020-11-30 DIAGNOSIS — E43 Unspecified severe protein-calorie malnutrition: Secondary | ICD-10-CM | POA: Diagnosis not present

## 2020-11-30 DIAGNOSIS — E785 Hyperlipidemia, unspecified: Secondary | ICD-10-CM | POA: Diagnosis not present

## 2020-11-30 DIAGNOSIS — F0151 Vascular dementia with behavioral disturbance: Secondary | ICD-10-CM | POA: Diagnosis not present

## 2020-11-30 DIAGNOSIS — I10 Essential (primary) hypertension: Secondary | ICD-10-CM | POA: Diagnosis not present

## 2020-11-30 MED ORDER — LORAZEPAM 0.5 MG PO TABS: 0.5000 mg | ORAL_TABLET | ORAL | 1 refills | Status: AC | PRN

## 2020-11-30 NOTE — Telephone Encounter (Signed)
Spoke w Mitzy Called in lorazepam 0.5 mg q 4 as needed agitation Will also sign hospice comfort orders

## 2020-11-30 NOTE — Telephone Encounter (Signed)
Mitzy nurse with [facility] calls nurse line requesting to speak with PCP. Mitzy needs to discuss pain medications with him and needs a verbal to dispense lorazepam PRN for agitation and sleep. Will forward to PCP.   Mitzy- [phone]

## 2020-11-30 NOTE — Telephone Encounter (Signed)
Called and left vm to call back about medications they would wish

## 2020-11-30 NOTE — Telephone Encounter (Signed)
Spoke with daughter Tresa Res medications  Asked them to contact me as needed Will speak with hospice about pain medications

## 2020-11-30 NOTE — Chronic Care Management (AMB) (Signed)
  Care Management   Note  11/30/2020 Name: [name] MRN: [id number] DOB: 04/08/1951  Jacqueline Goodman is a 70 y.o. year old female who is a primary care patient of Chambliss, Jeb Levering, MD. I reached out to [employer] by phone today in response to a referral sent by Ms. Jacqueline Goodman's PCP Dr. Erin Hearing.   Jacqueline Goodman was given information about care management services today including:  1. Care management services include personalized support from designated clinical staff supervised by her physician, including individualized plan of care and coordination with other care providers 2. 24/7 contact phone numbers for assistance for urgent and routine care needs. 3. The patient may stop care management services at any time by phone call to the office staff.  [name] did not agree to enrollment in care management services and does not wish to consider at this time.  Follow up plan: Patient declines further follow up and engagement by the care management team. Appropriate care team members and provider have been notified via electronic communication. The care management team is available to follow up with the patient after provider conversation with the patient regarding recommendation for care management engagement and subsequent re-referral to the care management team.   [facility]

## 2020-12-01 ENCOUNTER — Other Ambulatory Visit: Payer: Self-pay | Admitting: Family Medicine

## 2020-12-01 DIAGNOSIS — E119 Type 2 diabetes mellitus without complications: Secondary | ICD-10-CM | POA: Diagnosis not present

## 2020-12-01 DIAGNOSIS — E43 Unspecified severe protein-calorie malnutrition: Secondary | ICD-10-CM | POA: Diagnosis not present

## 2020-12-01 DIAGNOSIS — E785 Hyperlipidemia, unspecified: Secondary | ICD-10-CM | POA: Diagnosis not present

## 2020-12-01 DIAGNOSIS — E8809 Other disorders of plasma-protein metabolism, not elsewhere classified: Secondary | ICD-10-CM | POA: Diagnosis not present

## 2020-12-01 DIAGNOSIS — F0151 Vascular dementia with behavioral disturbance: Secondary | ICD-10-CM | POA: Diagnosis not present

## 2020-12-01 DIAGNOSIS — I10 Essential (primary) hypertension: Secondary | ICD-10-CM | POA: Diagnosis not present

## 2020-12-01 MED ORDER — NYSTATIN 100000 UNIT/GM EX POWD: CUTANEOUS | 3 refills | Status: AC

## 2020-12-01 MED ORDER — TRAMADOL HCL 50 MG PO TABS: 50.0000 mg | ORAL_TABLET | Freq: Four times a day (QID) | ORAL | 1 refills | Status: AC | PRN

## 2020-12-01 MED ORDER — GABAPENTIN 100 MG PO CAPS: 100.0000 mg | ORAL_CAPSULE | Freq: Three times a day (TID) | ORAL | 3 refills | Status: AC

## 2020-12-01 NOTE — Progress Notes (Signed)
Spoke w April from hospice Will prescribe Nystatin for intertrigo Gabapentin for leg discomfort Tramadol for pain not relieved by tylenol

## 2020-12-05 ENCOUNTER — Encounter: Payer: Self-pay | Admitting: Family Medicine

## 2020-12-05 DIAGNOSIS — E785 Hyperlipidemia, unspecified: Secondary | ICD-10-CM | POA: Diagnosis not present

## 2020-12-05 DIAGNOSIS — F0151 Vascular dementia with behavioral disturbance: Secondary | ICD-10-CM | POA: Diagnosis not present

## 2020-12-05 DIAGNOSIS — I10 Essential (primary) hypertension: Secondary | ICD-10-CM | POA: Diagnosis not present

## 2020-12-05 DIAGNOSIS — E43 Unspecified severe protein-calorie malnutrition: Secondary | ICD-10-CM | POA: Diagnosis not present

## 2020-12-05 DIAGNOSIS — E119 Type 2 diabetes mellitus without complications: Secondary | ICD-10-CM | POA: Diagnosis not present

## 2020-12-05 DIAGNOSIS — E8809 Other disorders of plasma-protein metabolism, not elsewhere classified: Secondary | ICD-10-CM | POA: Diagnosis not present

## 2020-12-06 DIAGNOSIS — E8809 Other disorders of plasma-protein metabolism, not elsewhere classified: Secondary | ICD-10-CM | POA: Diagnosis not present

## 2020-12-06 DIAGNOSIS — E43 Unspecified severe protein-calorie malnutrition: Secondary | ICD-10-CM | POA: Diagnosis not present

## 2020-12-06 DIAGNOSIS — F0151 Vascular dementia with behavioral disturbance: Secondary | ICD-10-CM | POA: Diagnosis not present

## 2020-12-06 DIAGNOSIS — I10 Essential (primary) hypertension: Secondary | ICD-10-CM | POA: Diagnosis not present

## 2020-12-06 DIAGNOSIS — E785 Hyperlipidemia, unspecified: Secondary | ICD-10-CM | POA: Diagnosis not present

## 2020-12-06 DIAGNOSIS — E119 Type 2 diabetes mellitus without complications: Secondary | ICD-10-CM | POA: Diagnosis not present

## 2020-12-07 DIAGNOSIS — E43 Unspecified severe protein-calorie malnutrition: Secondary | ICD-10-CM | POA: Diagnosis not present

## 2020-12-07 DIAGNOSIS — E119 Type 2 diabetes mellitus without complications: Secondary | ICD-10-CM | POA: Diagnosis not present

## 2020-12-07 DIAGNOSIS — I10 Essential (primary) hypertension: Secondary | ICD-10-CM | POA: Diagnosis not present

## 2020-12-07 DIAGNOSIS — E785 Hyperlipidemia, unspecified: Secondary | ICD-10-CM | POA: Diagnosis not present

## 2020-12-07 DIAGNOSIS — E8809 Other disorders of plasma-protein metabolism, not elsewhere classified: Secondary | ICD-10-CM | POA: Diagnosis not present

## 2020-12-07 DIAGNOSIS — F0151 Vascular dementia with behavioral disturbance: Secondary | ICD-10-CM | POA: Diagnosis not present

## 2020-12-08 DIAGNOSIS — F0151 Vascular dementia with behavioral disturbance: Secondary | ICD-10-CM | POA: Diagnosis not present

## 2020-12-08 DIAGNOSIS — E119 Type 2 diabetes mellitus without complications: Secondary | ICD-10-CM | POA: Diagnosis not present

## 2020-12-08 DIAGNOSIS — E785 Hyperlipidemia, unspecified: Secondary | ICD-10-CM | POA: Diagnosis not present

## 2020-12-08 DIAGNOSIS — E8809 Other disorders of plasma-protein metabolism, not elsewhere classified: Secondary | ICD-10-CM | POA: Diagnosis not present

## 2020-12-08 DIAGNOSIS — E43 Unspecified severe protein-calorie malnutrition: Secondary | ICD-10-CM | POA: Diagnosis not present

## 2020-12-08 DIAGNOSIS — I10 Essential (primary) hypertension: Secondary | ICD-10-CM | POA: Diagnosis not present

## 2020-12-09 DIAGNOSIS — E785 Hyperlipidemia, unspecified: Secondary | ICD-10-CM | POA: Diagnosis not present

## 2020-12-09 DIAGNOSIS — I10 Essential (primary) hypertension: Secondary | ICD-10-CM | POA: Diagnosis not present

## 2020-12-09 DIAGNOSIS — E43 Unspecified severe protein-calorie malnutrition: Secondary | ICD-10-CM | POA: Diagnosis not present

## 2020-12-09 DIAGNOSIS — E8809 Other disorders of plasma-protein metabolism, not elsewhere classified: Secondary | ICD-10-CM | POA: Diagnosis not present

## 2020-12-09 DIAGNOSIS — E119 Type 2 diabetes mellitus without complications: Secondary | ICD-10-CM | POA: Diagnosis not present

## 2020-12-09 DIAGNOSIS — F0151 Vascular dementia with behavioral disturbance: Secondary | ICD-10-CM | POA: Diagnosis not present

## 2020-12-10 DIAGNOSIS — E785 Hyperlipidemia, unspecified: Secondary | ICD-10-CM | POA: Diagnosis not present

## 2020-12-10 DIAGNOSIS — E8809 Other disorders of plasma-protein metabolism, not elsewhere classified: Secondary | ICD-10-CM | POA: Diagnosis not present

## 2020-12-10 DIAGNOSIS — F0151 Vascular dementia with behavioral disturbance: Secondary | ICD-10-CM | POA: Diagnosis not present

## 2020-12-10 DIAGNOSIS — I10 Essential (primary) hypertension: Secondary | ICD-10-CM | POA: Diagnosis not present

## 2020-12-10 DIAGNOSIS — E43 Unspecified severe protein-calorie malnutrition: Secondary | ICD-10-CM | POA: Diagnosis not present

## 2020-12-10 DIAGNOSIS — E119 Type 2 diabetes mellitus without complications: Secondary | ICD-10-CM | POA: Diagnosis not present

## 2020-12-11 DIAGNOSIS — E43 Unspecified severe protein-calorie malnutrition: Secondary | ICD-10-CM | POA: Diagnosis not present

## 2020-12-11 DIAGNOSIS — E785 Hyperlipidemia, unspecified: Secondary | ICD-10-CM | POA: Diagnosis not present

## 2020-12-11 DIAGNOSIS — E8809 Other disorders of plasma-protein metabolism, not elsewhere classified: Secondary | ICD-10-CM | POA: Diagnosis not present

## 2020-12-11 DIAGNOSIS — F0151 Vascular dementia with behavioral disturbance: Secondary | ICD-10-CM | POA: Diagnosis not present

## 2020-12-11 DIAGNOSIS — E119 Type 2 diabetes mellitus without complications: Secondary | ICD-10-CM | POA: Diagnosis not present

## 2020-12-11 DIAGNOSIS — I10 Essential (primary) hypertension: Secondary | ICD-10-CM | POA: Diagnosis not present

## 2020-12-12 ENCOUNTER — Telehealth: Payer: Self-pay

## 2020-12-12 DIAGNOSIS — E8809 Other disorders of plasma-protein metabolism, not elsewhere classified: Secondary | ICD-10-CM | POA: Diagnosis not present

## 2020-12-12 DIAGNOSIS — E43 Unspecified severe protein-calorie malnutrition: Secondary | ICD-10-CM | POA: Diagnosis not present

## 2020-12-12 DIAGNOSIS — E119 Type 2 diabetes mellitus without complications: Secondary | ICD-10-CM | POA: Diagnosis not present

## 2020-12-12 DIAGNOSIS — I10 Essential (primary) hypertension: Secondary | ICD-10-CM | POA: Diagnosis not present

## 2020-12-12 DIAGNOSIS — F0151 Vascular dementia with behavioral disturbance: Secondary | ICD-10-CM | POA: Diagnosis not present

## 2020-12-12 DIAGNOSIS — E785 Hyperlipidemia, unspecified: Secondary | ICD-10-CM | POA: Diagnosis not present

## 2020-12-12 NOTE — Telephone Encounter (Signed)
Patient's daughter calls nurse line requesting to speak to Dr. Erin Hearing regarding Hospice care. Daughter is concerned as last week mother was requesting food and water and this week she is so sedated she is unable to make these requests. Advised daughter that I would send message to Dr. Erin Hearing, however, I also recommended that she reach out to [facility], as they should be able to discuss care plan and answer her questions in better detail.   Daughter verbalizes understanding.   Routing to PCP.   Talbot Grumbling, RN

## 2020-12-13 DIAGNOSIS — E119 Type 2 diabetes mellitus without complications: Secondary | ICD-10-CM | POA: Diagnosis not present

## 2020-12-13 DIAGNOSIS — E8809 Other disorders of plasma-protein metabolism, not elsewhere classified: Secondary | ICD-10-CM | POA: Diagnosis not present

## 2020-12-13 DIAGNOSIS — E785 Hyperlipidemia, unspecified: Secondary | ICD-10-CM | POA: Diagnosis not present

## 2020-12-13 DIAGNOSIS — F0151 Vascular dementia with behavioral disturbance: Secondary | ICD-10-CM | POA: Diagnosis not present

## 2020-12-13 DIAGNOSIS — E43 Unspecified severe protein-calorie malnutrition: Secondary | ICD-10-CM | POA: Diagnosis not present

## 2020-12-13 DIAGNOSIS — I10 Essential (primary) hypertension: Secondary | ICD-10-CM | POA: Diagnosis not present

## 2020-12-13 NOTE — Telephone Encounter (Signed)
Discussed at EOL patients often do not have an appetite and that IV feedings will not reverse this  Jacqueline Goodman was appreciative of explanation

## 2020-12-14 DIAGNOSIS — E8809 Other disorders of plasma-protein metabolism, not elsewhere classified: Secondary | ICD-10-CM | POA: Diagnosis not present

## 2020-12-14 DIAGNOSIS — E43 Unspecified severe protein-calorie malnutrition: Secondary | ICD-10-CM | POA: Diagnosis not present

## 2020-12-14 DIAGNOSIS — E119 Type 2 diabetes mellitus without complications: Secondary | ICD-10-CM | POA: Diagnosis not present

## 2020-12-14 DIAGNOSIS — E785 Hyperlipidemia, unspecified: Secondary | ICD-10-CM | POA: Diagnosis not present

## 2020-12-14 DIAGNOSIS — F0151 Vascular dementia with behavioral disturbance: Secondary | ICD-10-CM | POA: Diagnosis not present

## 2020-12-14 DIAGNOSIS — I10 Essential (primary) hypertension: Secondary | ICD-10-CM | POA: Diagnosis not present

## 2020-12-25 DEATH — deceased

## 2021-04-28 IMAGING — CT CT ABD-PELV W/O CM
2 of 5 series · 17 of 46 positions shown, 19 images · non-contrast
Comparison: 09/05/2017

CLINICAL DATA: Severe sepsis.  Recent UTI.  Abdominal pain

EXAM:
CT ABDOMEN AND PELVIS WITHOUT CONTRAST
TECHNIQUE: Multidetector CT imaging of the abdomen and pelvis was performed
following the standard protocol without IV contrast.

[Series 3: a/p w/o 5mm · axial · non-contrast · 0.84mm/px · z∈[+827,+1252]mm · 14 of 97 slices shown, 16 images]
[im 6/97  soft-tissue]
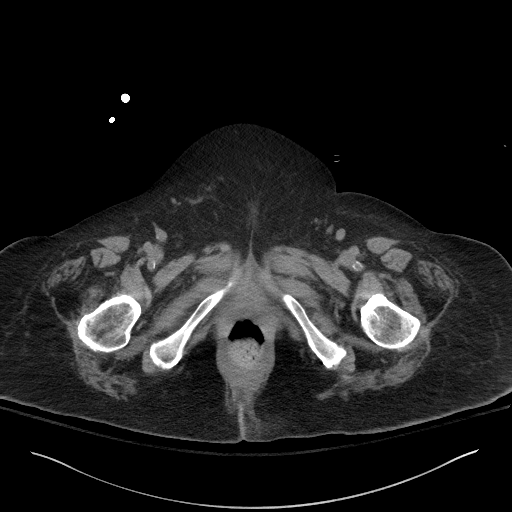
[im 6/97  bone]
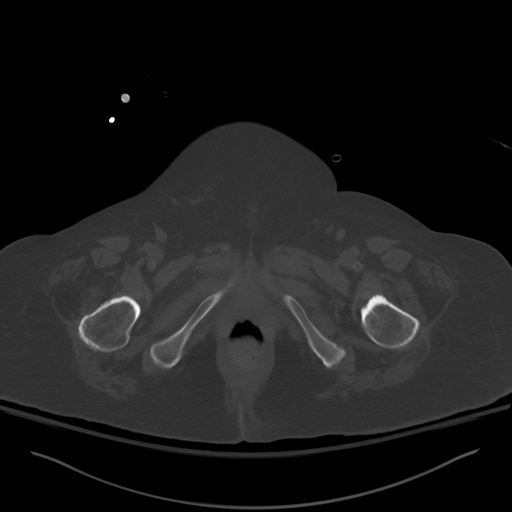
[im 11/97  soft-tissue]
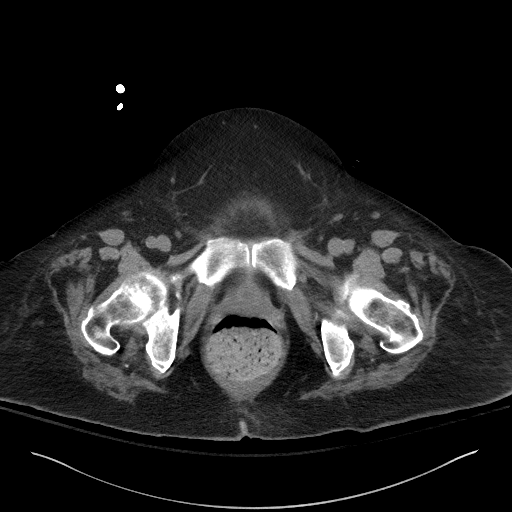
[im 22/97  soft-tissue]
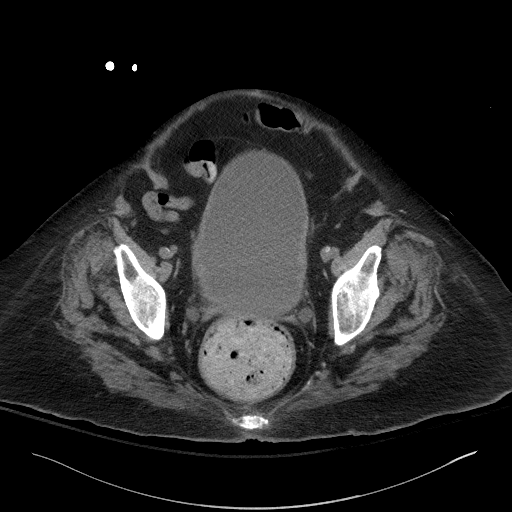
[im 27/97  soft-tissue]
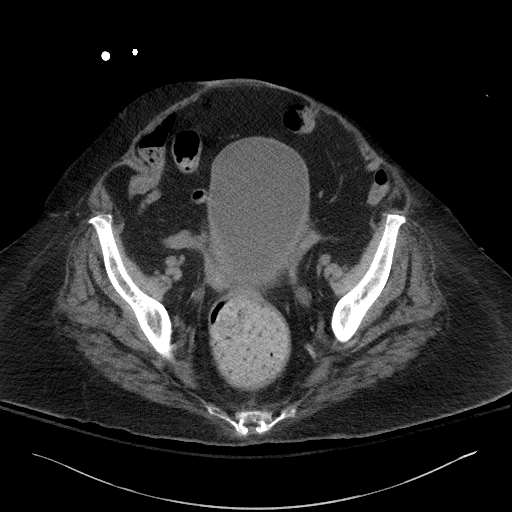
[im 33/97  soft-tissue]
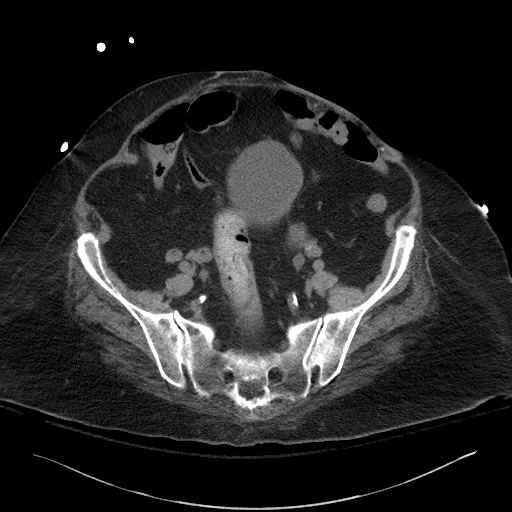
[im 38/97  soft-tissue]
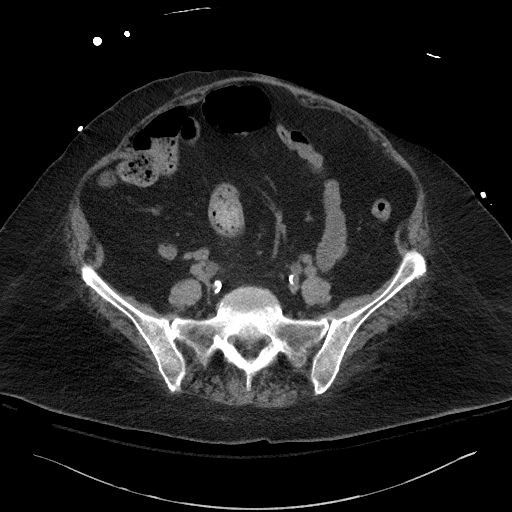
[im 43/97  soft-tissue]
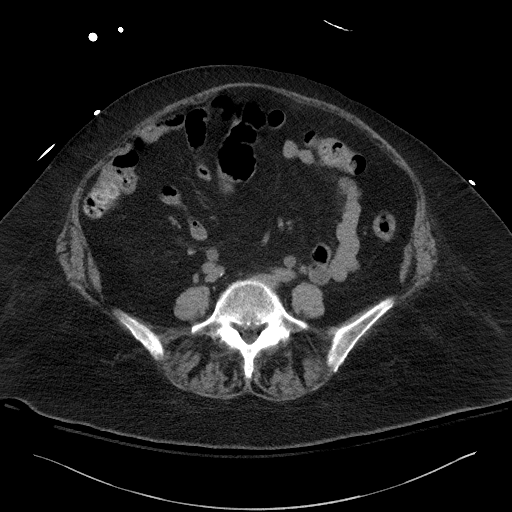
[im 54/97  soft-tissue]
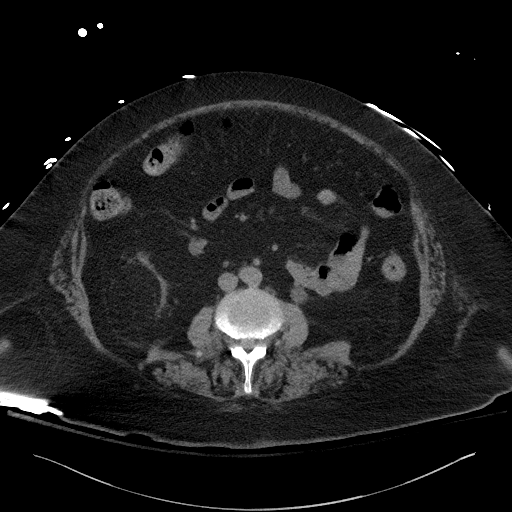
[im 59/97  soft-tissue]
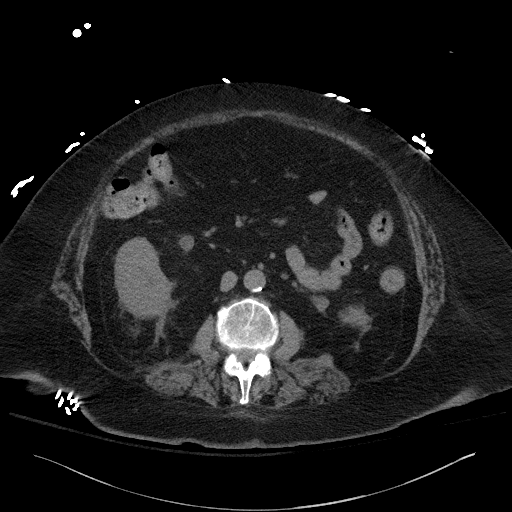
[im 59/97  bone]
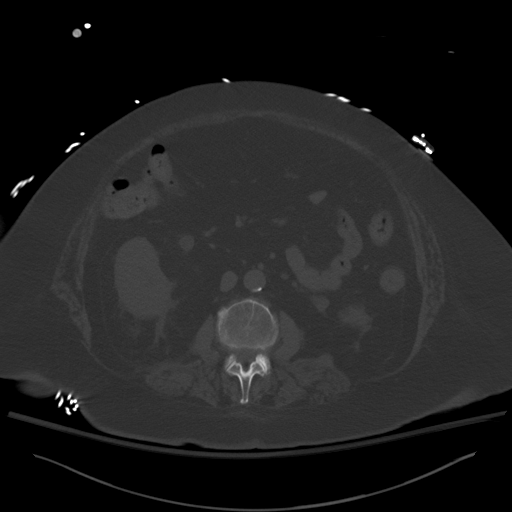
[im 65/97  soft-tissue]
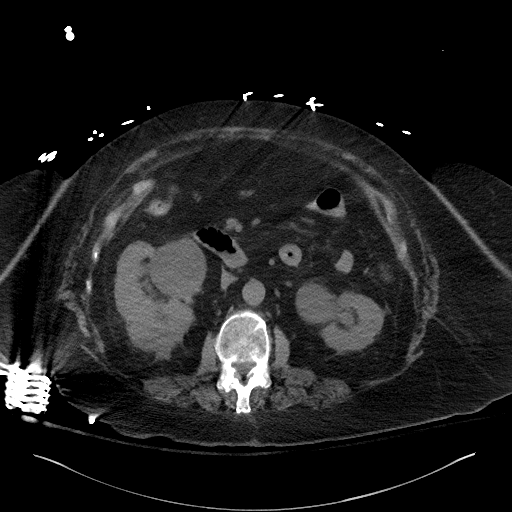
[im 70/97  soft-tissue]
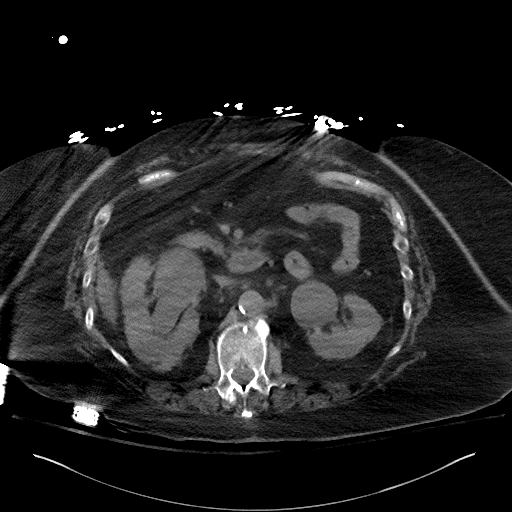
[im 75/97  soft-tissue]
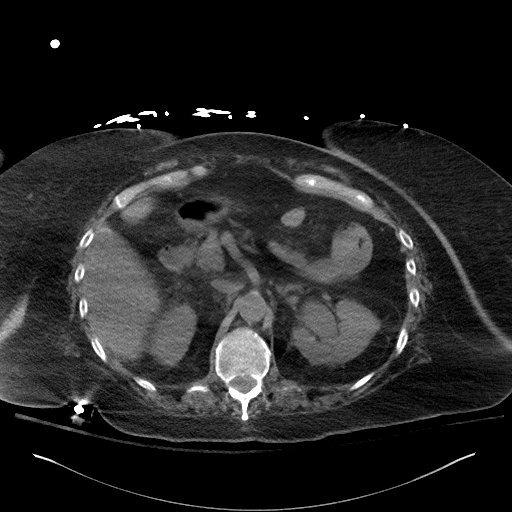
[im 86/97  soft-tissue]
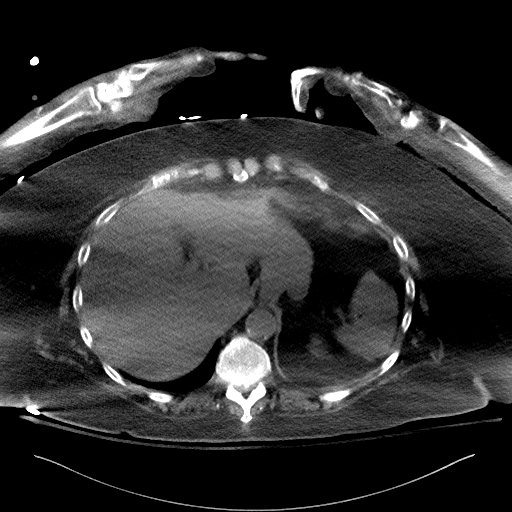
[im 91/97  soft-tissue]
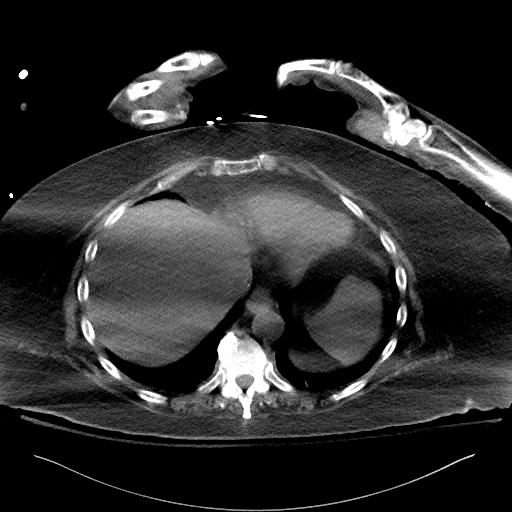

[Series 7: a/p w/o cor · coronal · non-contrast · 0.91mm/px · 3 of 160 slices shown]
[im 54/160  soft-tissue]
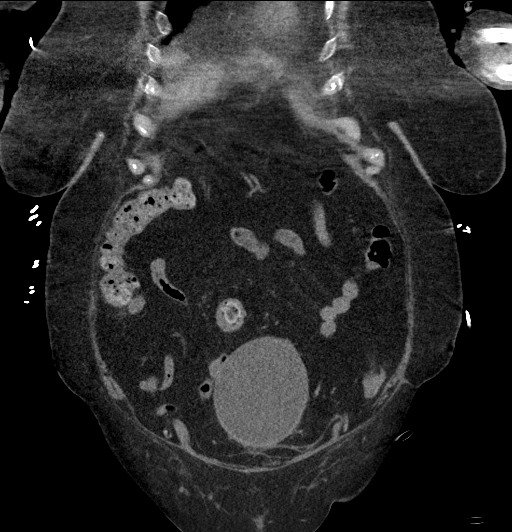
[im 71/160  soft-tissue]
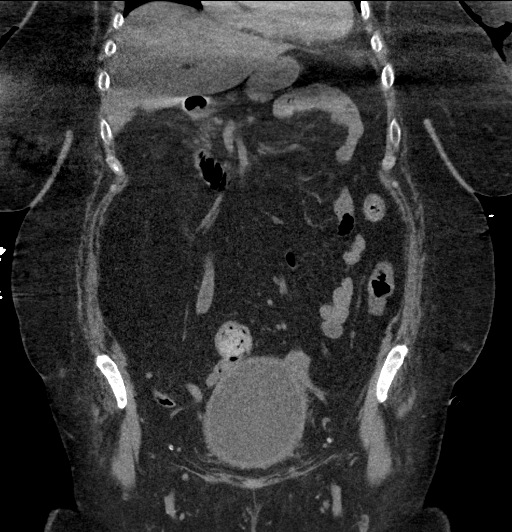
[im 89/160  soft-tissue]
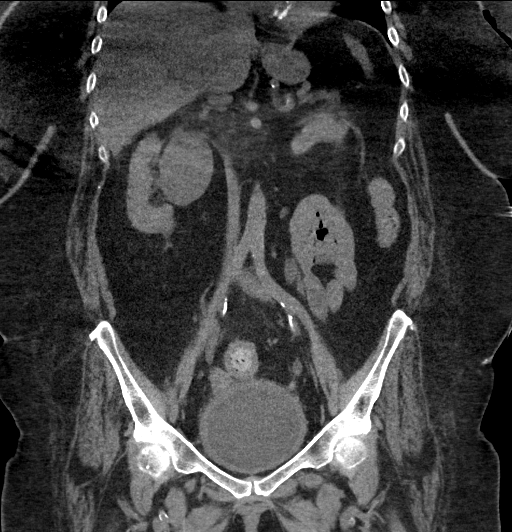

[17 of 46 positions shown; findings below may reference images not displayed]

FINDINGS: Lower chest:  Mitral valve calcification.  No acute finding.

Hepatobiliary: Limited liver assessment due to streak artifact from
patient arms and noncontrast technique.Cholecystectomy.

Pancreas: Unremarkable.

Spleen: Unremarkable.

Adrenals/Urinary Tract: Negative adrenals. Bilateral
hydroureteronephrosis to the level of a dilated urinary bladder. The
bladder is thick walled bilaterally which could be from inflammation
or chronic outlet obstruction.

Stomach/Bowel: No bowel obstruction or visible inflammation,
including of the appendix. Stool distends the rectum to 8 cm
diameter.

Vascular/Lymphatic: Atheromatous calcification. No mass or
adenopathy.

Reproductive:Hysterectomy.

Other: No ascites or pneumoperitoneum. Ventral abdominal wall
scarring with muscular thinning.

Musculoskeletal: No acute abnormalities. Ordinary spinal
degeneration
IMPRESSION: 1. Bilateral hydroureteronephrosis above a distended urinary
bladder. Bladder wall thickening could reflect superimposed
cystitis.
2. Rectal stool distention to 8 cm.

## 2021-04-28 IMAGING — MR MR HEAD W/O CM
5 series · 42 of 48 positions shown · non-contrast
Comparison: CT head without contrast 11/20/2020

CLINICAL DATA: Abnormal breathing and confusion. Acute toxic
metabolic encephalopathy.

EXAM:
MRI HEAD WITHOUT CONTRAST
TECHNIQUE: Multiplanar, multiecho pulse sequences of the brain and surrounding
structures were obtained without intravenous contrast. The patient
only tolerated axial and coronal diffusion weighted images and axial
FLAIR and SWI images.

[Series 3: DWI · axial · 3.0mm · 1.09mm/px · z∈[-49,+92]mm · 15 of 106 slices shown (1 of 3)]
[im 1/106]
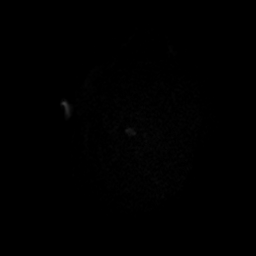
[im 7/106]
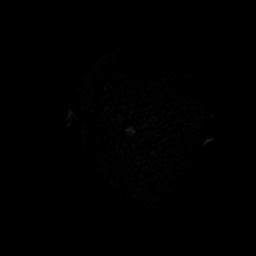
[im 14/106]
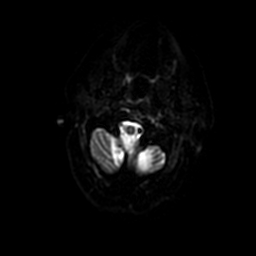
[im 20/106]
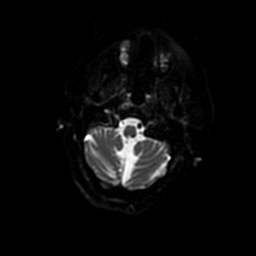
[im 27/106]
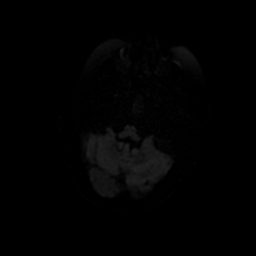
[im 33/106]
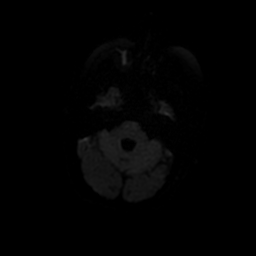
[im 40/106]
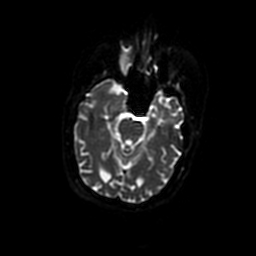
[im 46/106]
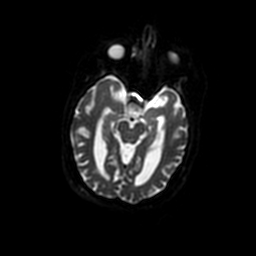
[im 53/106]
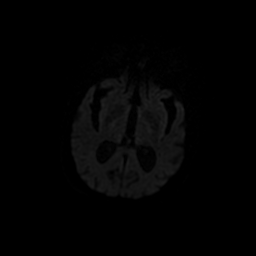
[im 60/106]
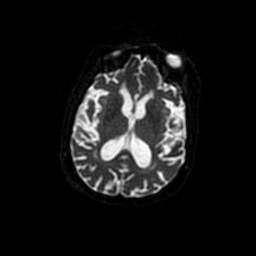
[im 66/106]
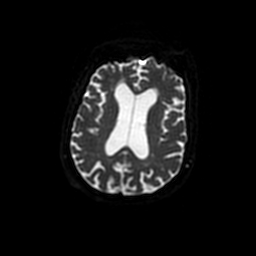
[im 73/106]
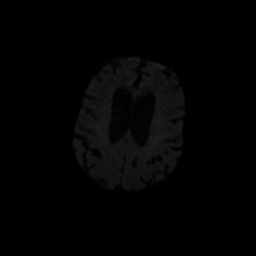
[im 86/106]
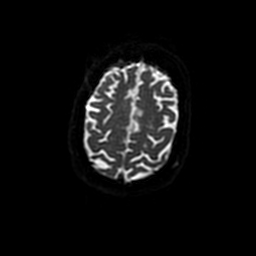
[im 92/106]
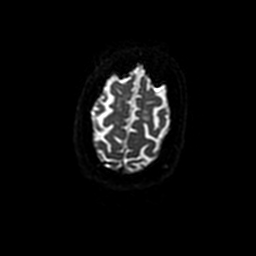
[im 99/106]
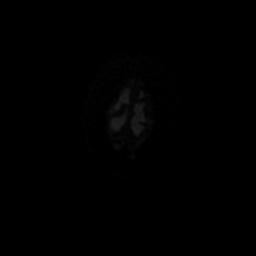

[Series 4: FLAIR · axial · 3.0mm · 0.43mm/px · z∈[-63,+87]mm · 5 of 27 slices shown]
[im 1/27]
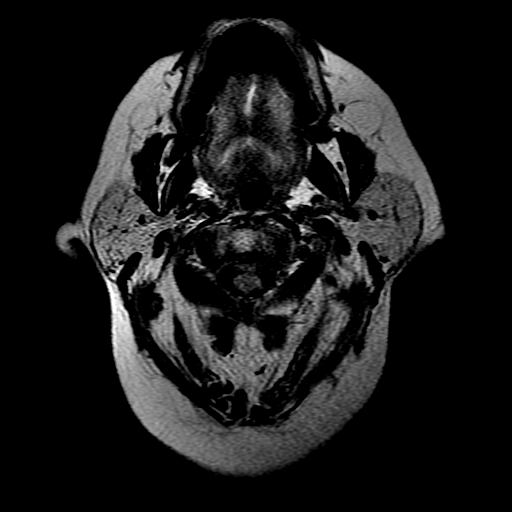
[im 7/27]
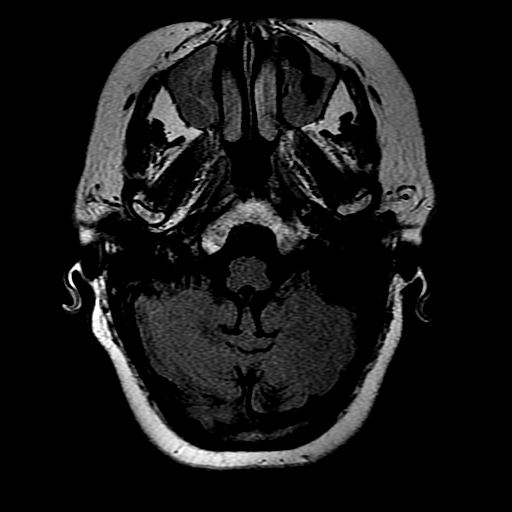
[im 14/27]
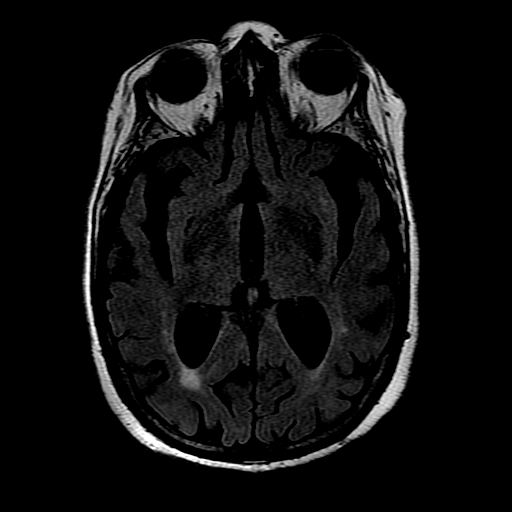
[im 20/27]
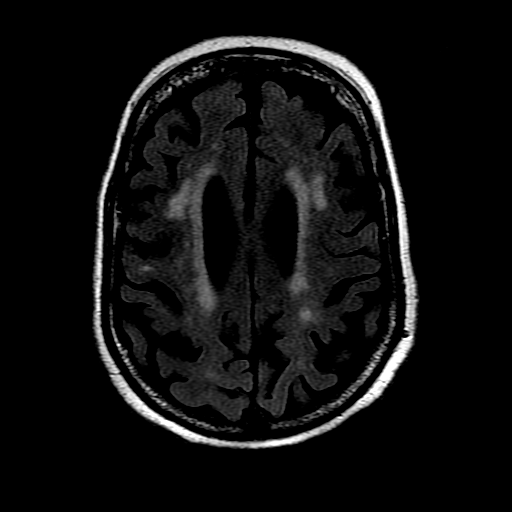
[im 27/27]
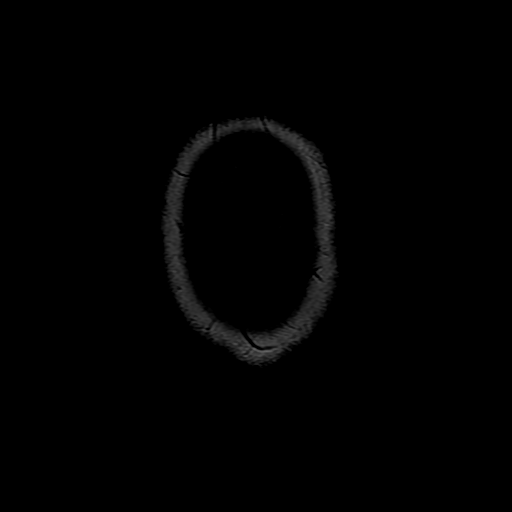

[Series 5: ax mpgr · axial · 5.0mm · 0.43mm/px · z∈[-62,+86]mm · 4 of 23 slices shown]
[im 1/23]
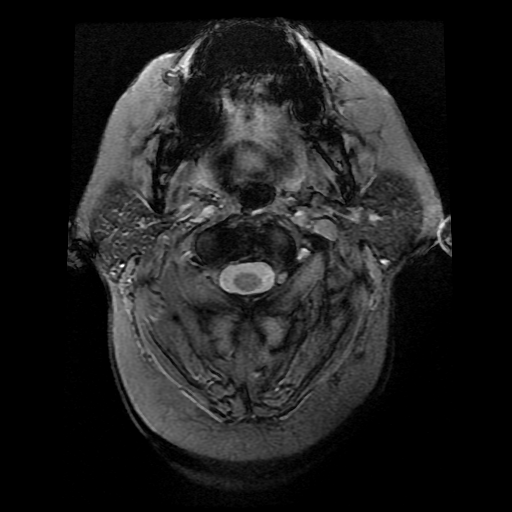
[im 8/23]
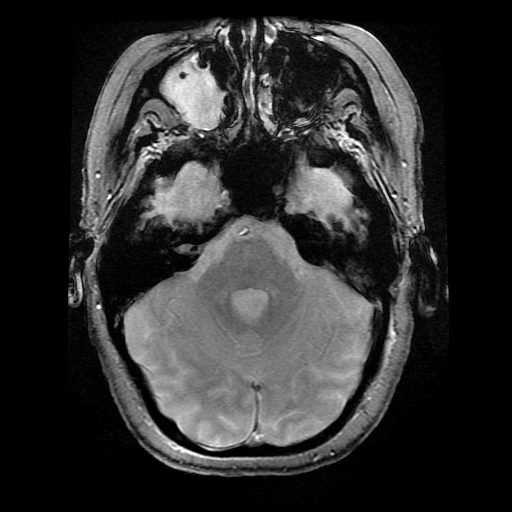
[im 15/23]
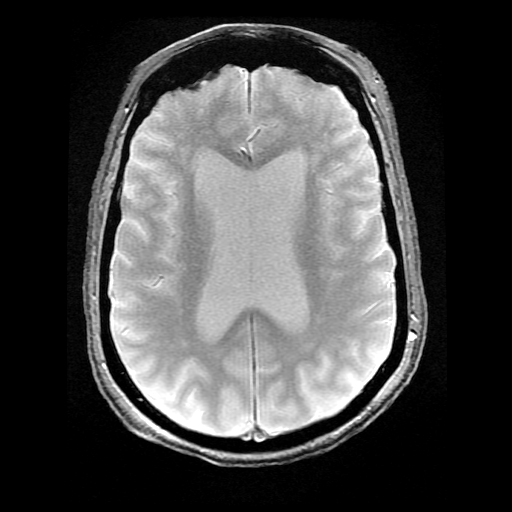
[im 23/23]
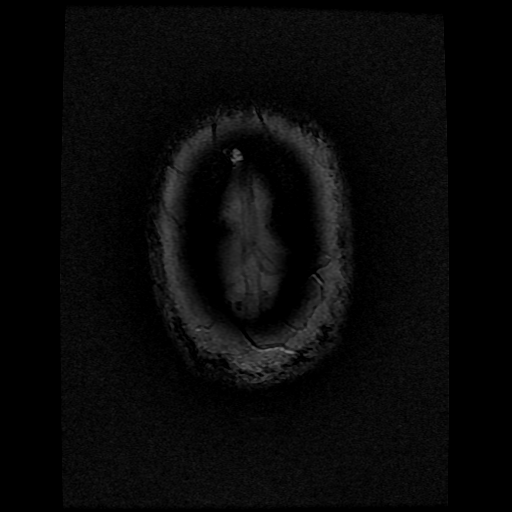

[Series 6: DWI · coronal · 5.0mm · 1.09mm/px · 9 of 78 slices shown (2 of 3)]
[im 1/78]
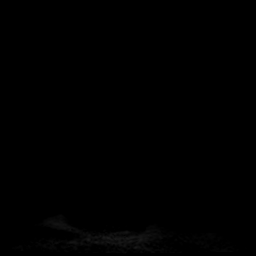
[im 13/78]
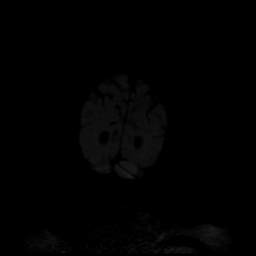
[im 26/78]
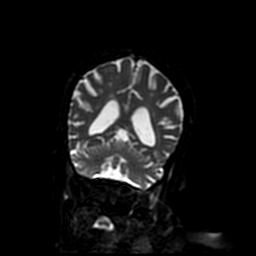
[im 33/78]
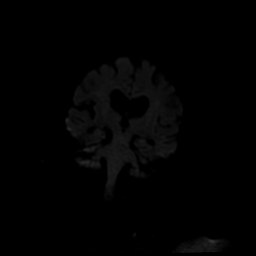
[im 39/78]
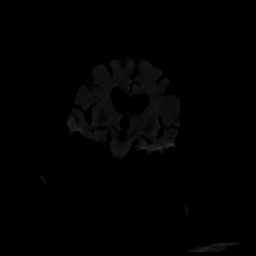
[im 45/78]
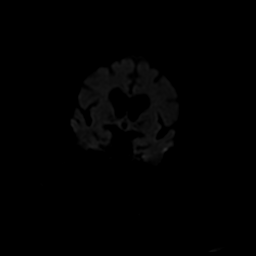
[im 52/78]
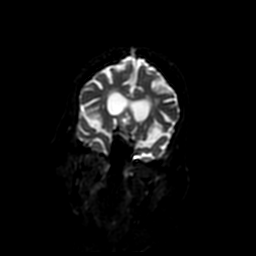
[im 65/78]
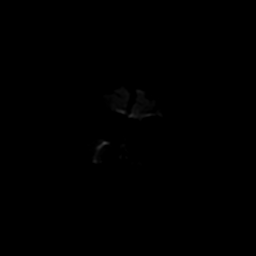
[im 78/78]
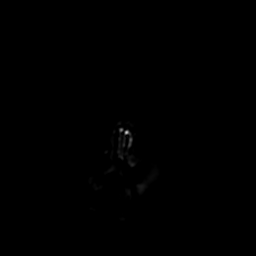

[Series 300: DWI · axial · 3.0mm · 1.09mm/px · z∈[-49,+101]mm · 9 of 53 slices shown (3 of 3)]
[im 1/53]
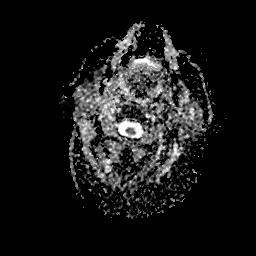
[im 7/53]
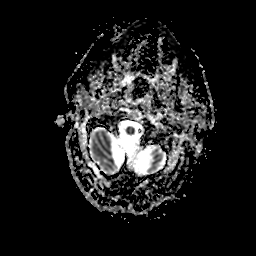
[im 14/53]
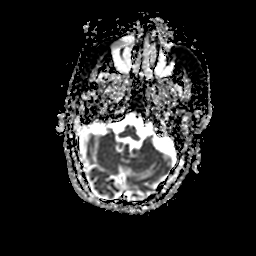
[im 20/53]
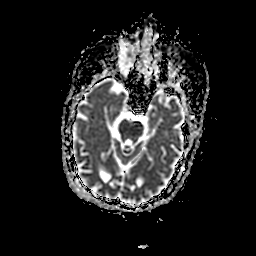
[im 27/53]
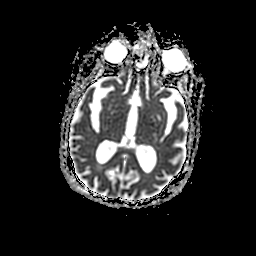
[im 33/53]
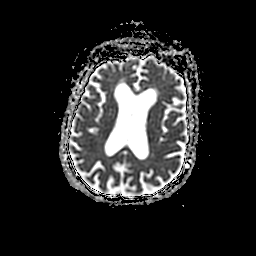
[im 40/53]
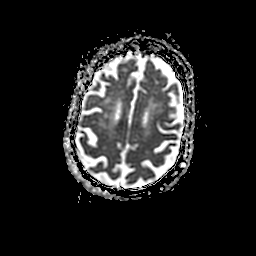
[im 46/53]
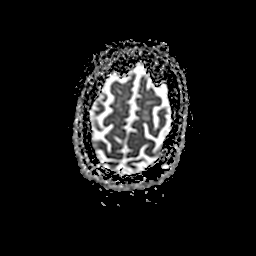
[im 53/53]
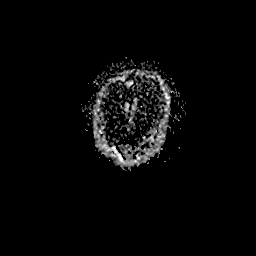

[42 of 48 positions shown; findings below may reference images not displayed]

FINDINGS: Brain: The diffusion-weighted images demonstrate no acute or
subacute infarction. Chronic atrophy and moderate white matter
changes are present bilaterally. The ventricles are proportionate to
the degree of atrophy. No significant extraaxial fluid collection is
present. The internal auditory canals are within normal limits. The
brainstem and cerebellum are within normal limits. No acute
hemorrhage or mass lesion is present.

Vascular: Flow is present in the major intracranial arteries.

Skull and upper cervical spine: The craniocervical junction is
normal. Upper cervical spine is within normal limits. Marrow signal
is unremarkable.

Sinuses/Orbits: Right greater than left maxillary sinus
opacification appears chronic. Anterior right ethmoid air cells
opacified. The paranasal sinuses and mastoid air cells are otherwise
clear. Bilateral lens replacements are noted. Globes and orbits are
otherwise unremarkable.
IMPRESSION: 1. No acute intracranial abnormality.
2. Chronic atrophy and moderate white matter disease likely reflects
the sequela of chronic microvascular ischemia.
3. Chronic right greater than left maxillary and ethmoid sinus
disease.
